# Patient Record
Sex: Male | Born: 1945 | Race: Black or African American | Hispanic: No | Marital: Married | Smoking: Never smoker
Health system: Southern US, Community
[De-identification: ages and names within clinical notes are randomized; demographics above are authoritative.]

## PROBLEM LIST (undated history)

## (undated) ENCOUNTER — Ambulatory Visit (HOSPITAL_COMMUNITY): Disposition: A | Discharge status: Home / Self Care | Payer: Medicare HMO

## (undated) DIAGNOSIS — I2510 Atherosclerotic heart disease of native coronary artery without angina pectoris: Secondary | ICD-10-CM

## (undated) DIAGNOSIS — I10 Essential (primary) hypertension: Secondary | ICD-10-CM

## (undated) DIAGNOSIS — M25569 Pain in unspecified knee: Secondary | ICD-10-CM

## (undated) DIAGNOSIS — H43391 Other vitreous opacities, right eye: Secondary | ICD-10-CM

## (undated) DIAGNOSIS — Z860100 Personal history of colon polyps, unspecified: Secondary | ICD-10-CM

## (undated) DIAGNOSIS — D352 Benign neoplasm of pituitary gland: Secondary | ICD-10-CM

## (undated) DIAGNOSIS — E7800 Pure hypercholesterolemia, unspecified: Secondary | ICD-10-CM

## (undated) DIAGNOSIS — K5900 Constipation, unspecified: Secondary | ICD-10-CM

## (undated) DIAGNOSIS — N400 Enlarged prostate without lower urinary tract symptoms: Secondary | ICD-10-CM

## (undated) DIAGNOSIS — Z9289 Personal history of other medical treatment: Secondary | ICD-10-CM

## (undated) DIAGNOSIS — E785 Hyperlipidemia, unspecified: Secondary | ICD-10-CM

## (undated) DIAGNOSIS — E119 Type 2 diabetes mellitus without complications: Secondary | ICD-10-CM

## (undated) DIAGNOSIS — Z800 Family history of malignant neoplasm of digestive organs: Secondary | ICD-10-CM

## (undated) DIAGNOSIS — R202 Paresthesia of skin: Secondary | ICD-10-CM

## (undated) DIAGNOSIS — J189 Pneumonia, unspecified organism: Secondary | ICD-10-CM

## (undated) DIAGNOSIS — R413 Other amnesia: Secondary | ICD-10-CM

## (undated) HISTORY — PX: JOINT REPLACEMENT: SHX530

## (undated) HISTORY — DX: Paresthesia of skin: R20.2

## (undated) HISTORY — PX: CARDIAC CATHETERIZATION: SHX172

## (undated) HISTORY — DX: Other amnesia: R41.3

## (undated) HISTORY — PX: ELBOW SURGERY: SHX618

## (undated) HISTORY — DX: Pure hypercholesterolemia, unspecified: E78.00

## (undated) HISTORY — DX: Benign prostatic hyperplasia without lower urinary tract symptoms: N40.0

## (undated) HISTORY — DX: Personal history of colon polyps, unspecified: Z86.0100

## (undated) HISTORY — DX: Benign neoplasm of pituitary gland: D35.2

## (undated) HISTORY — DX: Personal history of colonic polyps: Z86.010

## (undated) HISTORY — DX: Other vitreous opacities, right eye: H43.391

## (undated) HISTORY — PX: PICC LINE INSERTION: CATH118290

## (undated) HISTORY — DX: Constipation, unspecified: K59.00

## (undated) HISTORY — PX: TRIGGER FINGER RELEASE: SHX641

## (undated) HISTORY — DX: Pain in unspecified knee: M25.569

## (undated) HISTORY — DX: Family history of malignant neoplasm of digestive organs: Z80.0

---

## 1967-04-12 DIAGNOSIS — Z9289 Personal history of other medical treatment: Secondary | ICD-10-CM

## 1967-04-12 HISTORY — PX: RESECTION BONE TUMOR FEMUR: SHX2326

## 1967-04-12 HISTORY — DX: Personal history of other medical treatment: Z92.89

## 2002-05-13 ENCOUNTER — Encounter: Payer: Self-pay | Admitting: Emergency Medicine

## 2002-05-13 ENCOUNTER — Emergency Department (HOSPITAL_COMMUNITY): Admission: EM | Admit: 2002-05-13 | Discharge: 2002-05-13 | Payer: Self-pay | Admitting: Emergency Medicine

## 2003-04-12 HISTORY — PX: TOTAL KNEE ARTHROPLASTY: SHX125

## 2003-10-15 ENCOUNTER — Emergency Department (HOSPITAL_COMMUNITY): Admission: EM | Admit: 2003-10-15 | Discharge: 2003-10-15 | Payer: Self-pay | Admitting: Emergency Medicine

## 2005-01-12 ENCOUNTER — Inpatient Hospital Stay (HOSPITAL_COMMUNITY): Admission: EM | Admit: 2005-01-12 | Discharge: 2005-01-13 | Payer: Self-pay | Admitting: Emergency Medicine

## 2005-01-12 ENCOUNTER — Ambulatory Visit: Payer: Self-pay | Admitting: Internal Medicine

## 2005-02-03 ENCOUNTER — Ambulatory Visit: Payer: Self-pay | Admitting: Internal Medicine

## 2005-02-08 ENCOUNTER — Ambulatory Visit: Payer: Self-pay

## 2005-11-16 ENCOUNTER — Emergency Department (HOSPITAL_COMMUNITY): Admission: EM | Admit: 2005-11-16 | Discharge: 2005-11-16 | Payer: Self-pay | Admitting: Emergency Medicine

## 2006-02-17 ENCOUNTER — Encounter: Admission: RE | Admit: 2006-02-17 | Discharge: 2006-02-17 | Payer: Self-pay | Admitting: Endocrinology

## 2007-01-31 ENCOUNTER — Emergency Department (HOSPITAL_COMMUNITY): Admission: EM | Admit: 2007-01-31 | Discharge: 2007-01-31 | Payer: Self-pay | Admitting: Emergency Medicine

## 2007-04-27 ENCOUNTER — Observation Stay (HOSPITAL_COMMUNITY): Admission: EM | Admit: 2007-04-27 | Discharge: 2007-04-28 | Payer: Self-pay | Admitting: Emergency Medicine

## 2007-04-27 ENCOUNTER — Ambulatory Visit: Payer: Self-pay | Admitting: Cardiology

## 2007-05-15 ENCOUNTER — Ambulatory Visit: Payer: Self-pay

## 2007-05-16 ENCOUNTER — Ambulatory Visit: Payer: Self-pay | Admitting: Internal Medicine

## 2007-10-13 ENCOUNTER — Emergency Department (HOSPITAL_COMMUNITY): Admission: EM | Admit: 2007-10-13 | Discharge: 2007-10-13 | Payer: Self-pay | Admitting: Emergency Medicine

## 2008-01-23 ENCOUNTER — Emergency Department (HOSPITAL_BASED_OUTPATIENT_CLINIC_OR_DEPARTMENT_OTHER): Admission: EM | Admit: 2008-01-23 | Discharge: 2008-01-23 | Payer: Self-pay | Admitting: Emergency Medicine

## 2008-05-29 DIAGNOSIS — E782 Mixed hyperlipidemia: Secondary | ICD-10-CM | POA: Insufficient documentation

## 2008-05-29 DIAGNOSIS — I2510 Atherosclerotic heart disease of native coronary artery without angina pectoris: Secondary | ICD-10-CM | POA: Insufficient documentation

## 2008-05-29 DIAGNOSIS — I10 Essential (primary) hypertension: Secondary | ICD-10-CM

## 2009-01-17 ENCOUNTER — Emergency Department (HOSPITAL_COMMUNITY): Admission: EM | Admit: 2009-01-17 | Discharge: 2009-01-17 | Payer: Self-pay | Admitting: Family Medicine

## 2009-04-17 ENCOUNTER — Encounter: Admission: RE | Admit: 2009-04-17 | Discharge: 2009-04-17 | Payer: Self-pay | Admitting: Gastroenterology

## 2009-04-30 ENCOUNTER — Encounter: Admission: RE | Admit: 2009-04-30 | Discharge: 2009-04-30 | Payer: Self-pay | Admitting: Allergy

## 2010-08-24 NOTE — Discharge Summary (Signed)
NAME:  Joshua Vargas, Joshua Vargas NO.:  [id number]   MEDICAL RECORD NO.:  [id number]          PATIENT TYPE:  OBV   LOCATION:  3704                         FACILITY:  [facility]   PHYSICIAN:  Bevelyn Buckles. Bensimhon, MDDATE OF BIRTH:  21-May-1945   DATE OF ADMISSION:  04/27/2007  DATE OF DISCHARGE:  04/28/2007                               DISCHARGE SUMMARY   PRIMARY CARDIOLOGIST:  Bevelyn Buckles. Bensimhon, MD.   PRIMARY CARE:  Reather Littler, MD.   DISCHARGING DIAGNOSES:  1. Chest pain, atypical negative, cardiac enzymes this admission.      Electrocardiogram without acute ST or T-wave changes.  2. Shoulder pain with recommendations for Naprosyn p.r.n. at      discharge.  3. Coronary artery disease, status post catheterization in 2006 with      recommendations for medical therapy at that time.  4. Diabetes.  5. Hypertension.  6. Hyperlipidemia.  7. Obesity  8. 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   HOSPITAL COURSE:  [name] is very pleasant 65 year old African  American gentleman with history stated above, who presented with  complaints of episodic left shoulder and arm pain as well as chest pain,  felt to be atypical but with known history of coronary artery disease.  The patient was admitted for observation and cycling enzymes.  EKG  without acute changes.  Dr. Gala Romney in to see the patient on day of  discharge.  The patient up ambulating without chest discomfort.  Being  discharged home for outpatient stress Myoview.  Office will call the  patient at home to arrange.  Appropriate information has been faxed in  our nuclear medicine department so to call the patient and schedule.   Medications as prior to this admission include:  1. Aspirin 81 mg.  2. Actos/metformin 15/850 mg.  3. Crestor 5 mg.  4. Benicar/hydrochlorothiazide 40/12.5 mg.  5. Klor-Con 10 mEq.  6.  Dostinex 0.5 mg weekly.  7. Naprosyn for shoulder pain p.r.n.  8. Nitroglycerin as needed.   Follow up with Dr. Gala Romney after stress Myoview.   DURATION OF DISCHARGE ENCOUNTER:  Less than 30 minutes.      Dorian Pod, ACNP      Bevelyn Buckles. Bensimhon, MD  Electronically Signed    MB/MEDQ  D:  04/28/2007  T:  04/28/2007  Job:  [id number]   cc:   Reather Littler, M.D.

## 2010-08-24 NOTE — H&P (Signed)
NAME:  Joshua Vargas, Joshua Vargas NO.:  [id number]   MEDICAL RECORD NO.:  [id number]          PATIENT TYPE:  OBV   LOCATION:  3704                         FACILITY:  [facility]   PHYSICIAN:  Madolyn Frieze. Jens Som, MD, FACCDATE OF BIRTH:  1946/04/05   DATE OF ADMISSION:  04/27/2007  DATE OF DISCHARGE:                              HISTORY & PHYSICAL   PRIMARY CARE PHYSICIAN:  Reather Littler, MD   PRIMARY CARDIOLOGIST:  Bevelyn Buckles. Bensimhon, MD   CHIEF COMPLAINT:  Chest pain.   HISTORY OF PRESENT ILLNESS:  Joshua Vargas is a 65 year old male with a  history of coronary artery disease treated medically.  He has a long  history of episodic left shoulder and arm pain as well as chest pain.  He describes it as a dull pain.  There is no initiating factor.  There  are no obvious aggravating or alleviating factors.  There is no  association with meals, deep inspiration, cough.  It occurs at rest and  with exertion, although he exerts himself very little.  The symptoms are  not consistent with exertion.  The symptoms may last from 1 hour to all  day.  The only medication he has tried has been aspirin and some  Tylenol, but it is not clear if those helped.  Sometimes he has nausea  associated with it but no shortness of breath, vomiting or diaphoresis.  He has had the symptoms ever since his heart catheterization in 2006.  Today the symptoms were more severe at a 7/10.  He came to the emergency  room, where he received sublingual nitroglycerin and now his chest pain  is a 2/10.  He states that this time the chest pain has been going on  all day and that he had it all day yesterday as well.  Although he is  still complaining of some mild pain, he is otherwise resting  comfortably.   PAST MEDICAL HISTORY:  1. Status post cardiac catheterization in 2006 with LAD no significant      disease, diagonal 70-75% stenosed, circumflex and OM no significant      CAD, and RCA no significant CAD, EF at least  70%, medical therapy      recommended.  2. Diabetes.  3. Hypertension.  4. Hyperlipidemia.  5. Family history of premature coronary artery disease.  6. 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  7. Obesity.   SURGICAL HISTORY:  Status post left total knee replacement and multiple  left lower extremity surgeries because of the tumor.  He is also status  post cardiac catheterization.   ALLERGIES:  No known drug allergies.   CURRENT MEDICATIONS:  1. Benicar/HCT 12.5//40 mg daily.  2. Aspirin 81 mg daily.  3. Crestor 5 mg daily.  4. Actos/metformin 15/850 mg daily.  5. Klor-Con 10 mEq daily.  6. Dostinex 0.5 mg weekly.   SOCIAL HISTORY:  He lives in [place] with his wife and works as a  [employer]ar[employer] manager.  He has no history of alcohol, tobacco or drug  abuse.  Because of the lower extremity problems,  he does not exert  himself very much or walk very much.   FAMILY HISTORY:  His mother is alive at age 23 with no history of  coronary artery disease.  His father died at age 73 of a stroke and his  brother died at age 64 of an MI.   REVIEW OF SYSTEMS:  He had a urinary tract infection recently that is  resolved after treatment.  He has left-sided weakness and states it is  noticeable only in that he cannot do all the things with his left arm  that he used to be able to do and his left leg is chronically weak also.  He has rare reflux symptoms but denies hematemesis, hemoptysis or  melena.  He has had no coughing or wheezing.  He denies shortness of  breath, dyspnea on exertion, orthopnea or PND.  He has occasional  daytime edema in his left lower extremity.  A full 14-point review of  systems is otherwise negative.   PHYSICAL EXAM:  VITAL SIGNS:  Temperature is 97.7, blood pressure  187/98, pulse 65, respiratory rate 18, O2 saturation 99% on room air.  GENERAL:  He is a well-developed, well-nourished African American male  in no acute distress.  HEENT:   Normal.  NECK:  There is no lymphadenopathy, thyromegaly, bruit or JVD noted.  CV:  His heart is regular in rate and rhythm with an S1-S2, with no  critical murmur, rub or gallop noted.  Distal pulses are intact in all  four extremities with no femoral bruits appreciated.  LUNGS:  Clear to auscultation bilaterally.  SKIN:  No rashes or lesions are noted.  ABDOMEN:  Soft and nontender with active bowel sounds.  EXTREMITIES:  There is no cyanosis, clubbing or edema noted.  MUSCULOSKELETAL:  There is no spine or CVA tenderness.  No joint  effusion effusions are noted.  NEUROLOGIC:  He is alert and oriented.  Cranial nerves II-XII grossly  intact.   ECG is sinus rhythm, rate 60, with no acute ischemic changes.  Of note,  on telemetry his heart rate is frequently in the 50s and occasionally  drops into the 40s but is sinus rhythm throughout.   Laboratory values are pending except for point care markers negative x1.   IMPRESSION:  Joshua Vargas is a 65 year old male with past medical history  of multiple cardiac risk factors as well as a 70-75% diagonal by  catheterization in 2006, who is here with chest pain.  He had no other  obstructive coronary artery disease at catheterization and the diagonal  was felt to be a 2-2.5-mm vessel and therefore medical management was  the most appropriate.  He has had intermittent chest pain since.  The  pain is under his left breast and radiates to his left upper extremity  with no associated symptoms except for today he had some nausea.  It  seems to increase somewhat by lying flat but is not pleuritic or  exertional, and it can last 1 hour to all day.  He has had continuous  chest pain now for approximately 48 hours and his EKG is without ST  changes and is sinus rhythm.  His chest pain is very atypical.  He will  be admitted and myocardial infarction will be ruled out.  Will check an  EKG in  a.m.  If his EKG and cardiac enzymes remain negative, he  will be  discharged and can follow up as an outpatient with a Myoview and  then  follow up with Dr. Gala Romney.  He will be continued on his home  medications including Glucophage, as no dye studies are planned this  admission.      Theodore Demark, PA-C      Madolyn Frieze. Jens Som, MD, [facility]  Electronically Signed    RB/MEDQ  D:  04/27/2007  T:  04/28/2007  Job:  [id number]   cc:   Reather Littler, M.D.

## 2010-08-24 NOTE — Assessment & Plan Note (Signed)
[facility]                            CARDIOLOGY OFFICE NOTE   NAME:Joshua Vargas                     MRN:          [id number]  DATE:05/16/2007                            DOB:          [birth year]    PRIMARY CARE PHYSICIAN:  Dr. Reather Littler.   HISTORY:  [name] is a delightful 65 year old male with history of  hypertension, diabetes, and hyperlipidemia.  He also has coronary artery  disease with a 70-75% stenosis in the first diagonal.  Based on  catheterization in October 2006.   He has recently admitted with atypical chest pain.  Ruled out for  myocardial infarction with serial cardiac markers.  The pain resolved.  He is able to ambulate without any problem.  He underwent an outpatient  Myoview showed an EF of 62% with no ischemia.   He returns today for routine follow-up.  Is doing great.  No chest pain  or shortness of breath.  Recently saw Dr. Lucianne Muss and was found that his  blood pressure was significantly elevated, and was discovered he was not  taking his Norvasc.  Restarted this a day or two ago.   CURRENT MEDICATIONS:  Aspirin 81.  Crestor 5 a day, potassium, Benicar  hydrochlorothiazide 40/12.5, cabergoline  5 mg a week, Actoplus 15/850 a  day, amlodipine 10 a day.   EXAM:  He is well-appearing in no acute distress imaged in the clinic  without respiratory difficulty.  Blood pressure is 138/88.  Heart rate is 77 weights 280.  HEENT is normal.  NECK:  Supple.  No JVD.  Carotid 2+ bilateral bruits.  There is no  lymphadenopathy or thyromegaly.  CARDIAC:  PMI is nondisplaced.  He is regular rate and rhythm with an S4  no murmur.  LUNGS:  Clear.  ABDOMEN:  Soft, nontender, nondistended.  There is no  hepatosplenomegaly, no bruits, no masses.  Good bowel sounds.  EXTREMITIES:  Warm with no cyanosis, clubbing or edema.  No rash.  NEURO:  Alert and oriented x3.  Cranial nerves II-XII are intact.  Moves  all fours difficulty.  Affect  is pleasant.   EKG shows sinus rhythm with a rate of 77 with LVH and there are U waves.  No significant ST-T wave abnormalities.   ASSESSMENT/PLAN:  1. Chest pain/coronary artery disease.  He is doing well.  No evidence      of ischemia.  Continue current therapy.  I strongly recommended      extremity routine exercise program.  2. Hypertension, blood pressures mildly elevated is being followed      closely by Dr. Lucianne Muss.  3. Hyperlipidemia, given his coronary disease and diabetes goal LDL      less than 70.  Once again followed by Dr. Lucianne Muss titrate statin as      needed.   DISPOSITION:  We will see him back for routine follow-up in 6 months for  further evaluation.     Bevelyn Buckles. Bensimhon, MD  Electronically Signed    DRB/MedQ  DD: 05/16/2007  DT: 05/17/2007  Job #: [id number]   cc:  Reather Littler, M.D.

## 2010-08-27 NOTE — Discharge Summary (Signed)
NAME:  Joshua Vargas, Joshua Vargas NO.:  [id number]   MEDICAL RECORD NO.:  [id number]          PATIENT TYPE:  INP   LOCATION:  6525                         FACILITY:  [facility]   PHYSICIAN:  Arvilla Meres, M.D. LHCDATE OF BIRTH:  [birth year]   DATE OF ADMISSION:  01/12/2005  DATE OF DISCHARGE:  01/13/2005                                 DISCHARGE SUMMARY   PRIMARY CARE PHYSICIAN:  Reather Littler, M.D.   PRIMARY CARDIOLOGIST:  Arvilla Meres, M.D. [facility]   PRINCIPAL DIAGNOSIS:  Unstable angina/coronary artery disease.   OTHER DIAGNOSES:  1.  Type 2 diabetes mellitus.  2.  Hyperlipidemia.  3.  Hypertension.  4.  Family history of coronary artery disease.  5.  Multiple surgeries of the left lower extremity including left total knee      replacement.   ALLERGIES:  No known drug allergies.   PROCEDURE:  Left heart catheterization.   HISTORY OF PRESENT ILLNESS:  A 65 year old African American male with no  prior history of CAD who presented to the [facility] ED with a six-month  history of episodic rest and exertional 5/10 left-sided chest pressure and  heaviness, without associated symptoms. On the day of admission, he had an  episode that was worse than previous and was associated with shortness of  breath, nausea, and diaphoresis. As a result of this he presented to the  [facility] ED for further evaluation.   HOSPITAL COURSE:  The patient had negative CKs and MBs, but had mild  elevation of troponin of 0.06.  He underwent left heart catheterization on  October 4th that showed normal left main, and normal LAD, circumflex, and  RCA. He did have a 70% to 75% stenosis and a 2.5 mm first diagonal vessel.  His films were reviewed with the interventional team and it was felt that  given the small size of the diagonal that risk for restenosis in that vessel  was quite high and it was recommended that he be initiated on medical  therapy. He was initially on beta blocker, aspirin,  Plavix, and was  maintained on his ARB and Statin. He was also initiated on Imdur therapy.  The patient has not had any recurring chest pain and will follow up with Dr.  Gala Romney in three weeks. He is being discharged home today in satisfactory  condition.   DISCHARGE LABORATORY:  Hemoglobin 12.8, hematocrit 38, WBC 5.3, platelet  count 265,000. Sodium 138, potassium 3.5, chloride 104, BUN 13, creatinine  1.9, glucose 75. Troponin 0.06. CK and MB negative.   DISPOSITION:  The patient is being discharged home today in good condition.   FOLLOWUP PLANS AND APPOINTMENTS:  She is asked to follow up with primary  care physician, Dr. Lucianne Muss, within the next one to two weeks. He is to follow  up with Dr. Gala Romney on February 03, 2005, at 11 a.m.   DISCHARGE MEDICATIONS:  1.  Aspirin 81 g daily.  2.  Plavix 75 mg daily.  3.  Toprol-XL 50 mg daily.  4.  Caduet 40/10 mg daily.  5.  Benicar/HCT 40/25 mg daily.  6.  Metformin 1000 mg b.i.d. to be resumed on January 14, 2005.  7.  Amaryl 4 mg half tablet in the a.m. and one full tablet in the p.m.  8.  Imdur 3 mg daily.  9.  Nitroglycerin 0.4 mg sublingual p.r.n. chest pain.   OUTSTANDING LAB STUDIES:  None.   Discharge encounter 45 minutes, including physician time.      Ok Anis, NP      Arvilla Meres, M.D. [facility]  Electronically Signed    CRB/MEDQ  D:  01/13/2005  T:  01/13/2005  Job:  [id number]   cc:   Reather Littler, M.D.  Fax: [phone]   Arvilla Meres, M.D. [facility]  [employer]  [address]  [place]  [place] [address]

## 2010-08-27 NOTE — H&P (Signed)
NAME:  Joshua Vargas, Joshua Vargas NO.:  [id number]   MEDICAL RECORD NO.:  [id number]          PATIENT TYPE:  EMS   LOCATION:  MINO                         FACILITY:  [facility]   PHYSICIAN:  Arvilla Meres, M.D. LHCDATE OF BIRTH:  28-Sep-1945   DATE OF ADMISSION:  01/12/2005  DATE OF DISCHARGE:                                HISTORY & PHYSICAL   PRIMARY CARE PHYSICIAN:  Reather Littler, M.D.   PRIMARY CARDIOLOGIST:  New and will be Arvilla Meres, M.D.   CHIEF COMPLAINT:  Chest pain.   HISTORY OF PRESENT ILLNESS:  Mr. Allbritton is a 65 year old African-American  male with no known history of coronary artery disease.  He has approximately  a 34-month history of episodic left chest pain. At first there was no  association with exertion or meals, but then he began having chest pain  while he was on the treadmill for about the last 3 months, although he has  not worked out in 3 weeks.  The patient reaches a 5/10 at its worst and is  described as a pressure and heaviness.  It is on the left side of his chest.  He has not really tried any medication for this except for one time he took  Advil and one time he took Tylenol.  There is no change with position, deep  inspiration, or cough.  He tried eating at one point, but this did not seem  to affect his symptoms either.  The symptoms are associated at times with  shortness of breath, nausea, and diaphoresis.  Today he was at work and had  an episode, and the shortness of breath was worse than usual.  He came to  the emergency room and is currently symptom free.   PAST MEDICAL HISTORY:  1.  History of diabetes for 5 years.  2.  History of hyperlipidemia.  3.  History of hypertension.  4.  Family history of coronary artery disease.  5.  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   He has no other surgical  history.   ALLERGIES:  No known drug allergies.   CURRENT MEDICATIONS:  1.  Lipitor 10 mg daily.  2.  Amaryl 4 mg 1/2 tablet a.m., 1 tablet p.m.  3.  Benicar/hydrochlorothiazide 40/25, although this has not been filled      recently.  4.  Metformin 1000 mg twice daily.  5.  Aspirin 81 mg daily.  6.  Caduet 10/10 and Norvasc 10 are described as being on hold.   SOCIAL HISTORY:  He lives in [place] with his mother and works as a  Quarry manager.  He has no history of alcohol, tobacco, or drug abuse.  He exercises at the [facility], although not recently.   FAMILY HISTORY:  His mother is alive at age 64 with no history of heart  disease.  His father died of a stroke at age 65, and he had  a brother who  died of an MI at age 27.   REVIEW OF SYSTEMS:  He has vision loss and wears glasses.  He has episodic  numbness in his left arm that is not always associated with sitting in one  position for long periods of time but frequently is associated with this.  It is in his left elbow and radiates down into the fourth and fifth fingers  only.  He has arthralgias in his legs.  He denies any hematemesis,  hemoptysis, melena, heartburn, or indigestion.  Review of Systems is  otherwise negative.   PHYSICAL EXAMINATION:  VITAL SIGNS:  Temperature 97.9, pulse 78, respiratory  rate 20, blood pressure 141/68, O2 saturation 99% on room air.  GENERAL:  He is a well-developed, well-nourished African-American male in no  acute distress.  HEENT:  Head is normocephalic and atraumatic with pupils equal, round, and  reactive to light and accommodation. Extraocular movements intact.  Sclerae  clear.  Nares without discharge.  NECK:  There is no lymphadenopathy, thyromegaly, bruit, or JVD noted.  CARDIOVASCULAR: His heart is regular in rate and rhythm with S1 and S2 and  no significant murmur, rub, or gallop noted. His PMI is not displaced.  Distal pulses are 2+ in all 4 extremities, and no femoral bruits  are  appreciated.  LUNGS:  Clear to auscultation bilaterally.  SKIN:  No rashes or lesions are noted.  ABDOMEN:  Soft and nontender with active bowel sounds and no  hepatosplenomegaly by palpation.  EXTREMITIES: There is no cyanosis, clubbing, or edema noted.  MUSCULOSKELETAL: He has no joint effusions and no spine or CVA tenderness.  NEUROLOGIC: Alert and oriented.  Cranial nerves II-XII grossly intact.  Strength 5/5 all extremities.   Chest x-ray: No acute disease.   EKG: Rate 76, sinus rhythm, with no acute ischemic changes, and no old is  available for comparison.   Laboratory values: LFTs were within normal limits.  Hemoglobin 12.8,  hematocrit 38, WBC 5.3, platelets 265.  Sodium 138, potassium 3.5, chloride  104, CO2 not tested, BUN 13, creatinine 1, glucose 75.  Point-of-care  markers negative x1.   IMPRESSION AND PLAN:  Chest pain: It is progressive with both exertional and  resting symptoms.  It has typical and atypical features.  Point-of-care  markers are negative x1, and EKG is normal.  Given multiple cardiac risk  factors, will admit, rule out myocardial infarction, and plan for  catheterization in a.m.  Risks and benefits are  discussed with the patient, and he agrees to proceed. We will add a beta  blocker to his medication regimen and continue him on aspirin and statins.  Will add heparin as well.  Metformin will be held, and sliding scale will be  used.      Theodore Demark, P.A. [facility]      Arvilla Meres, M.D. [facility]  Electronically Signed    RB/MEDQ  D:  01/12/2005  T:  01/12/2005  Job:  [id number]   cc:   Reather Littler, M.D.  Fax: [phone]

## 2010-08-27 NOTE — Cardiovascular Report (Signed)
NAME:  Joshua Vargas, Joshua Vargas NO.:  [id number]   MEDICAL RECORD NO.:  [id number]          PATIENT TYPE:  INP   LOCATION:  6525                         FACILITY:  [facility]   PHYSICIAN:  Arvilla Meres, M.D. LHCDATE OF BIRTH:  [birth year]   DATE OF PROCEDURE:  01/12/2005  DATE OF DISCHARGE:                              CARDIAC CATHETERIZATION   PRIMARY CARE PHYSICIAN:  Dr. Lucianne Muss, cardiology. He is new to [facility].   PATIENT IDENTIFICATION:  Joshua Vargas is a very pleasant 65 year old male  with no known history of coronary disease but a history of hypertension,  diabetes and hyperlipidemia. He has had a several-month history of  exertional chest pain. This has both typical and atypical features. He  states that when he began to walk on the treadmill he had some chest  discomfort which is then relieved as he progresses but the chest pain then  comes back at peak workloads. Over the past few days, the chest pain has  been somewhat worse and he came to the emergency room for evaluation.  Initial EKG was normal. First set of cardiac markers were normal. Given his  risk factors and quality of chest pain, he is brought to the catheterization  lab to clearly define his coronary anatomy.   PROCEDURES PERFORMED:  1.  Selective coronary angiography.  2.  Left heart cath.  3.  Left ventriculogram.   DESCRIPTION OF PROCEDURE:  The risks, benefits of procedure explained to Mr.  Careers adviser. Consent was signed and placed on the chart. A 6-French arterial  sheath was placed in the right femoral artery using modified Seldinger  technique. Standard catheters including JL-4, JR-4 and angled pigtail were  used for the procedure. All catheter exchanges made over wire. There were no  apparent complications.   FINDINGS:  Central aortic pressure was 155/89 with a mean of 119. LV  pressure is 141/1 with an LVEDP of 10. There is no gradient on aortic valve  on pullback.   CORONARY  ANATOMY:  Left main was angiographically normal.   LAD was a long vessel that wrapped the apex. It gave off a single large  proximal diagonal. In the LAD itself there was no significant coronary  disease. In the proximal portion of the large diagonal, there was a 70-75%  stenosis. Initially this appeared to be about 2 mm vessel; however, after  injection of intracoronary nitroglycerin, it appeared to be 2.5 mm and the  lesion did appear to be a slightly more significant.   The left circumflex was a large vessel. It gave off a very tiny ramus. The  distal AV groove circumflex was small. The majority of the vessel was made  up of the large branching OM1. There was no significant angiographic CAD.   Right coronary artery was a dominant vessel. It gave off a large RV branch  and thus moderate size PDA and two small PLs. There is no significant  coronary artery disease.   Left ventriculogram done in the RAO position showed hyperdynamic LV with EF  of least 70%. There was no significant wall motion abnormalities  or mitral  regurgitation.   ASSESSMENT:  1.  Single-vessel coronary artery disease with borderline lesion in the      proximal first diagonal.  2.  Normal left ventricular function.  3.  Poorly controlled hypertension.   DISCUSSION:  Diagonal lesion does appear to be significant; however, it is 2-  2.5 mm vessel. Given his diabetes he would be at high risk for restenosis of  stent. Thus I do think it may be reasonable to proceed with a trial of  medical therapy. However, given his symptoms, I will review the  catheterization films with the interventional team in the morning to further  to decide. For now we will admit him and rule him out for myocardial  infarction and also proceed with risk factor management.      Arvilla Meres, M.D. [facility]  Electronically Signed     DB/MEDQ  D:  01/12/2005  T:  01/12/2005  Job:  [id number]   cc:   Lucianne Muss, M.D.  [facility]

## 2010-10-15 ENCOUNTER — Other Ambulatory Visit: Payer: Self-pay | Admitting: Gastroenterology

## 2010-10-15 DIAGNOSIS — R945 Abnormal results of liver function studies: Secondary | ICD-10-CM

## 2010-10-18 ENCOUNTER — Other Ambulatory Visit: Payer: Self-pay

## 2010-11-03 ENCOUNTER — Encounter: Payer: Self-pay | Admitting: *Deleted

## 2010-11-03 ENCOUNTER — Emergency Department (HOSPITAL_BASED_OUTPATIENT_CLINIC_OR_DEPARTMENT_OTHER)
Admission: EM | Admit: 2010-11-03 | Discharge: 2010-11-03 | Disposition: A | Discharge status: Home / Self Care | Payer: 59 | Attending: Emergency Medicine | Admitting: Emergency Medicine

## 2010-11-03 DIAGNOSIS — E119 Type 2 diabetes mellitus without complications: Secondary | ICD-10-CM | POA: Insufficient documentation

## 2010-11-03 DIAGNOSIS — I10 Essential (primary) hypertension: Secondary | ICD-10-CM | POA: Insufficient documentation

## 2010-11-03 DIAGNOSIS — E785 Hyperlipidemia, unspecified: Secondary | ICD-10-CM | POA: Insufficient documentation

## 2010-11-03 DIAGNOSIS — T7840XA Allergy, unspecified, initial encounter: Secondary | ICD-10-CM

## 2010-11-03 DIAGNOSIS — R221 Localized swelling, mass and lump, neck: Secondary | ICD-10-CM | POA: Insufficient documentation

## 2010-11-03 DIAGNOSIS — R220 Localized swelling, mass and lump, head: Secondary | ICD-10-CM | POA: Insufficient documentation

## 2010-11-03 HISTORY — DX: Essential (primary) hypertension: I10

## 2010-11-03 HISTORY — DX: Hyperlipidemia, unspecified: E78.5

## 2010-11-03 MED ORDER — PENICILLIN V POTASSIUM 500 MG PO TABS: 500.0000 mg | ORAL_TABLET | Freq: Three times a day (TID) | ORAL | Status: AC

## 2010-11-03 MED ORDER — METHYLPREDNISOLONE SODIUM SUCC 125 MG IJ SOLR
INTRAMUSCULAR | Status: AC
  Filled 2010-11-03: qty 2

## 2010-11-03 MED ORDER — PREDNISONE 10 MG PO TABS: 20.0000 mg | ORAL_TABLET | Freq: Two times a day (BID) | ORAL | Status: AC

## 2010-11-03 MED ORDER — METHYLPREDNISOLONE SODIUM SUCC 125 MG IJ SOLR
125.0000 mg | Freq: Once | INTRAMUSCULAR | Status: AC
  Administered 2010-11-03: 125 mg via INTRAMUSCULAR

## 2010-11-03 NOTE — ED Notes (Signed)
Pt reports receiving dental procedure yesterday and now has lower lip edema.  Denies any SHOB, difficulty breathing or swallowing.  He has had similar symptoms in the past relieved by IM steroids.

## 2010-11-03 NOTE — ED Notes (Signed)
Pt states dental work yesterday, lip swelling started last night around 7 pm

## 2010-11-03 NOTE — ED Provider Notes (Signed)
History     Chief Complaint  Patient presents with  . Allergic Reaction   HPI Comments: Had dental work done yesterday, now with swelling of lower lip.  Patient states that he always has this reaction to the anesthetic.  In the past, he has been given a "steriod shot" that seems to improve the swelling.  Denies fever or pain.  Patient is a 65 y.o. male presenting with allergic reaction. The history is provided by the patient.  Allergic Reaction The primary symptoms do not include wheezing, shortness of breath or palpitations. The current episode started 6 to 12 hours ago. The problem has been gradually worsening.  The onset of the reaction was associated with a new medication. Significant symptoms that are not present include rhinorrhea.    Past Medical History  Diagnosis Date  . Hypertension   . Diabetes mellitus   . Hyperlipemia     Past Surgical History  Procedure Date  . Joint replacement     History reviewed. No pertinent family history.  History  Substance Use Topics  . Smoking status: Never Smoker   . Smokeless tobacco: Not on file  . Alcohol Use: No      Review of Systems  Constitutional: Negative for fever, chills and diaphoresis.  HENT: Positive for facial swelling. Negative for rhinorrhea and postnasal drip.   Respiratory: Negative for shortness of breath, wheezing and stridor.   Cardiovascular: Negative for chest pain and palpitations.  All other systems reviewed and are negative.    Physical Exam  BP 122/80  Pulse 70  Temp 98.4 F (36.9 C)  Resp 16  Wt 214 lb (97.07 kg)  Physical Exam  Constitutional: He appears well-developed and well-nourished. No distress.  HENT:  Head: Normocephalic and atraumatic.       The lower lip is noted to have significant swelling.  There is no airway compromise or stridor.    Neck: Normal range of motion. Neck supple. No tracheal deviation present.  Cardiovascular: Normal rate, regular rhythm and normal heart  sounds.  Exam reveals no friction rub.   No murmur heard. Pulmonary/Chest: Effort normal and breath sounds normal. No stridor. No respiratory distress. He has no wheezes.  Skin: He is not diaphoretic.    ED Course  Procedures  MDM No worsening during ED course.  Will discharge with prednisone prescription.  I will also prescribe PCN and advise patient that if he worsens or starts with fever, he should fill this and initiate treatment.        Riley Lam [facility] 11/03/10 1648

## 2010-12-29 ENCOUNTER — Ambulatory Visit
Admission: RE | Admit: 2010-12-29 | Discharge: 2010-12-29 | Disposition: A | Discharge status: Home / Self Care | Payer: 59 | Source: Ambulatory Visit | Attending: Gastroenterology | Admitting: Gastroenterology

## 2010-12-29 DIAGNOSIS — R945 Abnormal results of liver function studies: Secondary | ICD-10-CM

## 2010-12-30 LAB — DIFFERENTIAL
Basophils Absolute: 0.1
Basophils Relative: 1
Eosinophils Absolute: 0.3
Eosinophils Relative: 6 — ABNORMAL HIGH
Lymphocytes Relative: 35
Lymphs Abs: 1.7
Monocytes Absolute: 0.4
Monocytes Relative: 9
Neutro Abs: 2.5
Neutrophils Relative %: 49

## 2010-12-30 LAB — CBC
HCT: 42.7
Hemoglobin: 14.1
MCHC: 33
MCV: 85.7
Platelets: 225
RBC: 4.98
RDW: 14.1
WBC: 5

## 2010-12-30 LAB — POCT CARDIAC MARKERS
CKMB, poc: 2
Myoglobin, poc: 86.8
Operator id: 285841
Troponin i, poc: <0.05

## 2010-12-30 LAB — I-STAT 8, (EC8 V) (CONVERTED LAB)
Acid-Base Excess: 3 — ABNORMAL HIGH
BUN: 12
Bicarbonate: 29.6 — ABNORMAL HIGH
Chloride: 104
Glucose, Bld: 87
HCT: 47
Hemoglobin: 16
Operator id: 285841
Potassium: 3.7
Sodium: 140
TCO2: 31
pCO2, Ven: 52 — ABNORMAL HIGH
pH, Ven: 7.364 — ABNORMAL HIGH

## 2010-12-30 LAB — CARDIAC PANEL(CRET KIN+CKTOT+MB+TROPI)
CK, MB: 2.8
Relative Index: 1.4
Total CK: 199
Troponin I: <0.01

## 2010-12-30 LAB — TROPONIN I: Troponin I: 0.01

## 2010-12-30 LAB — LIPID PANEL: Cholesterol: 127

## 2010-12-30 LAB — POCT I-STAT CREATININE
Creatinine, Ser: 1
Operator id: 285841

## 2011-01-19 LAB — URINE MICROSCOPIC-ADD ON

## 2011-01-19 LAB — URINALYSIS, ROUTINE W REFLEX MICROSCOPIC
Glucose, UA: NEGATIVE
Hgb urine dipstick: NEGATIVE
Protein, ur: NEGATIVE
Specific Gravity, Urine: 1.018
pH: 6.5

## 2011-01-19 LAB — URINE CULTURE

## 2012-10-25 ENCOUNTER — Other Ambulatory Visit: Payer: Self-pay | Admitting: *Deleted

## 2012-10-25 MED ORDER — PIOGLITAZONE HCL-METFORMIN HCL 15-850 MG PO TABS: 1.0000 | ORAL_TABLET | Freq: Two times a day (BID) | ORAL | Status: DC

## 2012-11-09 ENCOUNTER — Other Ambulatory Visit: Payer: Self-pay | Admitting: *Deleted

## 2012-11-12 ENCOUNTER — Other Ambulatory Visit: Payer: Self-pay | Admitting: *Deleted

## 2012-11-12 MED ORDER — DOXAZOSIN MESYLATE 2 MG PO TABS: 2.0000 mg | ORAL_TABLET | Freq: Every day | ORAL | Status: DC

## 2012-12-05 LAB — HM DIABETES EYE EXAM: HM Diabetic Eye Exam: NORMAL

## 2012-12-17 ENCOUNTER — Other Ambulatory Visit: Payer: Self-pay | Admitting: Endocrinology

## 2012-12-17 ENCOUNTER — Other Ambulatory Visit: Payer: Self-pay | Admitting: *Deleted

## 2012-12-17 DIAGNOSIS — I10 Essential (primary) hypertension: Secondary | ICD-10-CM

## 2012-12-17 DIAGNOSIS — E782 Mixed hyperlipidemia: Secondary | ICD-10-CM

## 2012-12-17 DIAGNOSIS — D352 Benign neoplasm of pituitary gland: Secondary | ICD-10-CM

## 2012-12-17 DIAGNOSIS — E119 Type 2 diabetes mellitus without complications: Secondary | ICD-10-CM | POA: Insufficient documentation

## 2012-12-19 ENCOUNTER — Other Ambulatory Visit: Payer: 59

## 2012-12-20 ENCOUNTER — Other Ambulatory Visit: Payer: 59

## 2012-12-20 DIAGNOSIS — I10 Essential (primary) hypertension: Secondary | ICD-10-CM

## 2012-12-20 DIAGNOSIS — IMO0001 Reserved for inherently not codable concepts without codable children: Secondary | ICD-10-CM

## 2012-12-20 DIAGNOSIS — E782 Mixed hyperlipidemia: Secondary | ICD-10-CM

## 2012-12-20 DIAGNOSIS — D352 Benign neoplasm of pituitary gland: Secondary | ICD-10-CM

## 2012-12-20 LAB — MICROALBUMIN / CREATININE URINE RATIO
Creatinine,U: 153.8 mg/dL
Microalb Creat Ratio: 1.2 mg/g (ref 0.0–30.0)
Microalb, Ur: 1.9 mg/dL (ref 0.0–1.9)

## 2012-12-20 LAB — LIPID PANEL
Cholesterol: 149 mg/dL (ref 0–200)
LDL Cholesterol: 59 mg/dL (ref 0–99)
Total CHOL/HDL Ratio: 2

## 2012-12-20 LAB — CBC
Hemoglobin: 14.9 g/dL (ref 13.0–17.0)
MCV: 84.5 fl (ref 78.0–100.0)
Platelets: 191 10*3/uL (ref 150.0–400.0)
RBC: 5.31 Mil/uL (ref 4.22–5.81)

## 2012-12-20 LAB — COMPREHENSIVE METABOLIC PANEL
Albumin: 4.2 g/dL (ref 3.5–5.2)
CO2: 24 mEq/L (ref 19–32)
GFR: 81.67 mL/min (ref >60.00)
Glucose, Bld: 138 mg/dL — ABNORMAL HIGH (ref 70–99)
Potassium: 3.7 mEq/L (ref 3.5–5.1)
Sodium: 139 mEq/L (ref 135–145)
Total Protein: 7.7 g/dL (ref 6.0–8.3)

## 2012-12-21 ENCOUNTER — Ambulatory Visit: Payer: 59 | Admitting: Endocrinology

## 2012-12-24 ENCOUNTER — Ambulatory Visit (INDEPENDENT_AMBULATORY_CARE_PROVIDER_SITE_OTHER): Payer: 59 | Admitting: Endocrinology

## 2012-12-24 ENCOUNTER — Encounter: Payer: Self-pay | Admitting: Endocrinology

## 2012-12-24 VITALS — BP 144/72 | HR 71 | Temp 98.2°F | Resp 12 | Ht 70.0 in | Wt 219.1 lb

## 2012-12-24 DIAGNOSIS — I10 Essential (primary) hypertension: Secondary | ICD-10-CM

## 2012-12-24 DIAGNOSIS — N529 Male erectile dysfunction, unspecified: Secondary | ICD-10-CM

## 2012-12-24 DIAGNOSIS — IMO0001 Reserved for inherently not codable concepts without codable children: Secondary | ICD-10-CM

## 2012-12-24 DIAGNOSIS — E782 Mixed hyperlipidemia: Secondary | ICD-10-CM

## 2012-12-24 DIAGNOSIS — D352 Benign neoplasm of pituitary gland: Secondary | ICD-10-CM

## 2012-12-24 NOTE — Progress Notes (Signed)
Patient ID: Joshua Vargas, male   DOB: 1945/12/15, 67 y.o.   MRN: [id number]  Joshua Vargas is an 67 y.o. male.   Reason for Appointment: Diabetes follow-up   History of Present Illness   Diagnosis: Type 2 DIABETES MELITUS, date of diagnosis: 2002      Previous history: He was initially treated with metformin alone and then subsequently was also given Actos and Amaryl. He has had fairly good control but somewhat inconsistent because of difficulties with compliance with diet and exercise periodically. A1c previously has fluctuated between about 6.7-7.6  Recent history: His blood sugars appear to be still fairly well controlled with A1c near 7% He thinks he is usually watching his diet but has not been doing any exercise despite previous reminders     Oral hypoglycemic drugs: Actoplusmet and Amaryl        Side effects from medications: None       Monitors blood glucose: Once a day.    Glucometer: One Touch.          Blood Glucose readings from recall: readings before breakfast: 118-140 in the morning;  does not check after meals Hypoglycemia frequency:  none        Meals: 3 meals per day.          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 He has had better libido with consistently treating this with Dostinex, testosterone level previously has been normal  LABS:  Appointment on 12/20/2012  Component Date Value Range Status  . Microalb, Ur 12/20/2012 1.9  0.0 - 1.9 mg/dL Final  . Creatinine,U 78/29/5621 153.8   Final  . Microalb Creat Ratio 12/20/2012 1.2  0.0 - 30.0 mg/g Final  . Prolactin 12/20/2012 15.3  2.1 - 17.1 ng/mL Final   Comment:      Reference Ranges:                                            Male:                       2.1 -  17.1 ng/ml                                           Male:   Pregnant          9.7 - 208.5 ng/mL                                                     Non Pregnant      2.8 -  29.2 ng/mL  Post Menopausal   1.8 -  20.3 ng/mL                                              . Cholesterol 12/20/2012 149  0 - 200 mg/dL Final   ATP III Classification       Desirable:  < 200 mg/dL               Borderline High:  200 - 239 mg/dL          High:  > = 161 mg/dL  . Triglycerides 12/20/2012 126.0  0.0 - 149.0 mg/dL Final   Normal:  <096 mg/dLBorderline High:  150 - 199 mg/dL  . HDL 12/20/2012 65.10  >39.00 mg/dL Final  . VLDL 04/54/0981 25.2  0.0 - 40.0 mg/dL Final  . LDL Cholesterol 12/20/2012 59  0 - 99 mg/dL Final  . Total CHOL/HDL Ratio 12/20/2012 2   Final                  Men          Women1/2 Average Risk     3.4          3.3Average Risk          5.0          4.42X Average Risk          9.6          7.13X Average Risk          15.0          11.0                      . WBC 12/20/2012 4.7  4.5 - 10.5 K/uL Final  . RBC 12/20/2012 5.31  4.22 - 5.81 Mil/uL Final  . Platelets 12/20/2012 191.0  150.0 - 400.0 K/uL Final  . Hemoglobin 12/20/2012 14.9  13.0 - 17.0 g/dL Final  . HCT 19/14/7829 44.9  39.0 - 52.0 % Final  . MCV 12/20/2012 84.5  78.0 - 100.0 fl Final  . MCHC 12/20/2012 33.3  30.0 - 36.0 g/dL Final  . RDW 56/21/3086 14.3  11.5 - 14.6 % Final  . Hemoglobin A1C 12/20/2012 7.1* 4.6 - 6.5 % Final   Glycemic Control Guidelines for People with Diabetes:Non Diabetic:  <6%Goal of Therapy: <7%Additional Action Suggested:  >8%   . Sodium 12/20/2012 139  135 - 145 mEq/L Final  . Potassium 12/20/2012 3.7  3.5 - 5.1 mEq/L Final  . Chloride 12/20/2012 101  96 - 112 mEq/L Final  . CO2 12/20/2012 24  19 - 32 mEq/L Final  . Glucose, Bld 12/20/2012 138* 70 - 99 mg/dL Final  . BUN 57/84/6962 12  6 - 23 mg/dL Final  .  Creatinine, Ser 12/20/2012 1.2  0.4 - 1.5 mg/dL Final  . Total Bilirubin 12/20/2012 0.9  0.3 - 1.2 mg/dL Final  . Alkaline Phosphatase 12/20/2012 37* 39 - 117 U/L Final  . AST 12/20/2012 54* 0 - 37 U/L Final  . ALT 12/20/2012 47  0 - 53 U/L Final  . Total Protein 12/20/2012 7.7  6.0 - 8.3 g/dL Final  . Albumin 95/28/4132 4.2  3.5 - 5.2 g/dL Final  . Calcium 44/04/270 9.6  8.4 - 10.5 mg/dL Final  . GFR 53/66/4403 81.67  >60.00 mL/min Final  Medication List       This list is accurate as of: 12/24/12  3:39 PM.  Always use your most recent med list.               acetaminophen 500 MG tablet  Commonly known as:  TYLENOL  Take 500 mg by mouth as needed. pain     amLODipine 10 MG tablet  Commonly known as:  NORVASC  Take 10 mg by mouth daily.     aspirin 81 MG EC tablet  Take 81 mg by mouth daily.     cabergoline 0.5 MG tablet  Commonly known as:  DOSTINEX  Take 0.25 mg by mouth 2 (two) times a week. Take on Saturday and Monday     doxazosin 2 MG tablet  Commonly known as:  CARDURA  Take 1 tablet (2 mg total) by mouth at bedtime.     glimepiride 4 MG tablet  Commonly known as:  AMARYL  Take 4 mg by mouth daily before breakfast.     KLOR-CON M10 10 MEQ tablet  Generic drug:  potassium chloride  Take 10 mEq by mouth daily.     oxymetazoline 0.05 % nasal spray  Commonly known as:  AFRIN  - Place 2 sprays into the nose as needed. Sinuses  -   -      pioglitazone-metformin 15-850 MG per tablet  Commonly known as:  ACTOPLUS MET  Take 1 tablet by mouth 2 (two) times daily with a meal.     rosuvastatin 5 MG tablet  Commonly known as:  CRESTOR  Take 5 mg by mouth daily.     simvastatin 40 MG tablet  Commonly known as:  ZOCOR  40 mg.     valsartan-hydrochlorothiazide 320-12.5 MG per tablet  Commonly known as:  DIOVAN-HCT     VIAGRA 50 MG tablet  Generic drug:  sildenafil  50 mg.     Vitamin D-3 5000 UNITS Tabs  Take 1 capsule by mouth at bedtime.         Allergies: No Known Allergies  Past Medical History  Diagnosis Date  . Hypertension   . Diabetes mellitus   . Hyperlipemia     Past Surgical History  Procedure Laterality Date  . Joint replacement      No family history on file.  Social History:  reports that he has never smoked. He does not have any smokeless tobacco history on file. He reports that he does not drink alcohol or use illicit drugs.  Review of Systems:  Hypertension:  BP at home systolic 130-138, diastolic 70-82 His amlodipine had been changed previously because of gum hypertrophy but he has gone back on it since he did not feel any different with twitching  Lipids: LDL 59, has been compliant with simvastatin.    He tends to have mild increase in liver functions, possibly from fatty liver  He has had erectile dysfunction.   Examination:   BP 144/72  Pulse 71  Temp(Src) 98.2 F (36.8 C)  Resp 12  Ht 5\' 10"  (1.778 m)  Wt 219 lb 1.6 oz (99.383 kg)  BMI 31.44 kg/m2  SpO2 97%  Body mass index is 31.44 kg/(m^2).    ASSESSMENT/ PLAN::   Diabetes type 2   Blood glucose control is not as good as 3 months ago with slight increase in A1c. Although he tends to have relatively higher readings in the mornings he has not checked readings at other times His main difficulty is  poor compliance with exercise and discussed need to do regular walking or other exercise.  Hypertension: Well controlled and will continue same regimen Hyperlipidemia is controlled with simvastatin  Prolactinoma, this is under control and prolactin is normal with Dostinex, to continue indefinitely  Madeline Bebout 12/24/2012, 3:39 PM

## 2012-12-24 NOTE — Patient Instructions (Addendum)
Please check blood sugars at least half the time about 2 hours after any meal and as directed on waking up. Please bring blood sugar monitor to each visit  Walk daily 

## 2012-12-26 DIAGNOSIS — N529 Male erectile dysfunction, unspecified: Secondary | ICD-10-CM | POA: Insufficient documentation

## 2012-12-26 DIAGNOSIS — D352 Benign neoplasm of pituitary gland: Secondary | ICD-10-CM | POA: Insufficient documentation

## 2013-01-28 ENCOUNTER — Ambulatory Visit: Payer: 59 | Admitting: Cardiology

## 2013-02-07 ENCOUNTER — Other Ambulatory Visit: Payer: Self-pay | Admitting: *Deleted

## 2013-02-07 MED ORDER — VALSARTAN-HYDROCHLOROTHIAZIDE 320-12.5 MG PO TABS: 1.0000 | ORAL_TABLET | Freq: Every day | ORAL | Status: DC

## 2013-02-11 ENCOUNTER — Encounter: Payer: Self-pay | Admitting: Cardiology

## 2013-02-12 ENCOUNTER — Ambulatory Visit: Payer: 59 | Admitting: Cardiology

## 2013-02-13 ENCOUNTER — Other Ambulatory Visit: Payer: Self-pay | Admitting: *Deleted

## 2013-02-13 MED ORDER — SILDENAFIL CITRATE 50 MG PO TABS: 50.0000 mg | ORAL_TABLET | ORAL | Status: DC | PRN

## 2013-03-14 ENCOUNTER — Encounter: Payer: Self-pay | Admitting: Cardiology

## 2013-04-01 ENCOUNTER — Other Ambulatory Visit: Payer: Self-pay | Admitting: *Deleted

## 2013-04-01 MED ORDER — SIMVASTATIN 40 MG PO TABS: 40.0000 mg | ORAL_TABLET | Freq: Every day | ORAL | Status: DC

## 2013-04-25 ENCOUNTER — Other Ambulatory Visit: Payer: 59

## 2013-05-02 ENCOUNTER — Encounter: Payer: Self-pay | Admitting: *Deleted

## 2013-05-02 ENCOUNTER — Ambulatory Visit: Payer: 59 | Admitting: Endocrinology

## 2013-05-14 ENCOUNTER — Other Ambulatory Visit: Payer: 59

## 2013-05-15 ENCOUNTER — Other Ambulatory Visit (INDEPENDENT_AMBULATORY_CARE_PROVIDER_SITE_OTHER): Payer: 59

## 2013-05-15 ENCOUNTER — Ambulatory Visit: Payer: 59 | Admitting: Endocrinology

## 2013-05-15 DIAGNOSIS — N529 Male erectile dysfunction, unspecified: Secondary | ICD-10-CM

## 2013-05-15 DIAGNOSIS — IMO0001 Reserved for inherently not codable concepts without codable children: Secondary | ICD-10-CM

## 2013-05-15 DIAGNOSIS — E1165 Type 2 diabetes mellitus with hyperglycemia: Principal | ICD-10-CM

## 2013-05-15 LAB — COMPREHENSIVE METABOLIC PANEL
ALBUMIN: 4 g/dL (ref 3.5–5.2)
ALT: 51 U/L (ref 0–53)
AST: 56 U/L — ABNORMAL HIGH (ref 0–37)
Alkaline Phosphatase: 38 U/L — ABNORMAL LOW (ref 39–117)
BUN: 10 mg/dL (ref 6–23)
CALCIUM: 9.4 mg/dL (ref 8.4–10.5)
CHLORIDE: 102 meq/L (ref 96–112)
CO2: 29 meq/L (ref 19–32)
Creatinine, Ser: 1 mg/dL (ref 0.4–1.5)
GFR: 95.84 mL/min (ref >60.00)
GLUCOSE: 108 mg/dL — AB (ref 70–99)
POTASSIUM: 3.8 meq/L (ref 3.5–5.1)
SODIUM: 140 meq/L (ref 135–145)
TOTAL PROTEIN: 7.5 g/dL (ref 6.0–8.3)
Total Bilirubin: 0.7 mg/dL (ref 0.3–1.2)

## 2013-05-15 LAB — HEMOGLOBIN A1C: HEMOGLOBIN A1C: 7 % — AB (ref 4.6–6.5)

## 2013-05-20 ENCOUNTER — Encounter: Payer: Self-pay | Admitting: Endocrinology

## 2013-05-20 ENCOUNTER — Ambulatory Visit (INDEPENDENT_AMBULATORY_CARE_PROVIDER_SITE_OTHER): Payer: 59 | Admitting: Endocrinology

## 2013-05-20 VITALS — BP 140/74 | HR 81 | Temp 98.2°F | Resp 14 | Ht 70.0 in | Wt 212.4 lb

## 2013-05-20 DIAGNOSIS — E1165 Type 2 diabetes mellitus with hyperglycemia: Principal | ICD-10-CM

## 2013-05-20 DIAGNOSIS — D353 Benign neoplasm of craniopharyngeal duct: Secondary | ICD-10-CM

## 2013-05-20 DIAGNOSIS — E782 Mixed hyperlipidemia: Secondary | ICD-10-CM

## 2013-05-20 DIAGNOSIS — I10 Essential (primary) hypertension: Secondary | ICD-10-CM

## 2013-05-20 DIAGNOSIS — D352 Benign neoplasm of pituitary gland: Secondary | ICD-10-CM

## 2013-05-20 DIAGNOSIS — E559 Vitamin D deficiency, unspecified: Secondary | ICD-10-CM

## 2013-05-20 DIAGNOSIS — IMO0001 Reserved for inherently not codable concepts without codable children: Secondary | ICD-10-CM

## 2013-05-20 NOTE — Progress Notes (Signed)
Patient ID: Joshua Vargas, male   DOB: 05/09/1945, 68 y.o.   MRN: [id number]   Reason for Appointment: Diabetes follow-up, 5 month visit   History of Present Illness   Diagnosis: Type 2 DIABETES MELITUS, date of diagnosis: 2002      Previous history: He was initially treated with metformin alone and then subsequently was also given Actos and Amaryl. He has had fairly good control but somewhat inconsistent because of difficulties with compliance with diet and exercise periodically. A1c previously has fluctuated between about 6.7-7.6  Recent history: He is still on his usual 3 drug regimen His blood sugars appear to be still fairly well controlled with A1c near 7% He thinks he is usually watching his diet  Since his last visit he has been more motivated to exercise and has been doing a good amount of walking Also has been able to lose weight He is taking maximum dose Amaryl now and occasionally will tend to get low blood sugar symptoms during the night.     Oral hypoglycemic drugs: Actoplusmet and Amaryl $RemoveBe'4mg'nMHITAgyc$ , 2 in evening         Side effects from medications: None       Monitors blood glucose: Once a day.    Glucometer: ? One Touch.          Blood Glucose readings from recall: readings before breakfast: 99-103 in the morning;  upto 160 after meals;  Hypoglycemia frequency:  occasionally overnight his blood sugars in 70s        Meals: 3 meals per day. Less sugar intake and meats         Physical activity: exercise:  Walking 3/7 days a week recently            Dietician visit: Most recent: 9509           Complications: are: Erectile dysfunction   Wt Readings from Last 3 Encounters:  05/20/13 212 lb 6.4 oz (96.344 kg)  12/24/12 219 lb 1.6 oz (99.383 kg)  11/03/10 214 lb (97.07 kg)   LABS:  Lab Results  Component Value Date   HGBA1C 7.0* 05/15/2013   HGBA1C 7.1* 12/20/2012   HGBA1C  Value: 6.3 (NOTE)   The ADA recommends the following therapeutic goals for glycemic   control related  to Hgb A1C measurement:   Goal of Therapy:   < 7.0% Hgb A1C   Action Suggested:  > 8.0% Hgb A1C   Ref:  Diabetes Care, 22, Suppl. 1, 1999* 04/27/2007   Lab Results  Component Value Date   MICROALBUR 1.9 12/20/2012   LDLCALC 59 12/20/2012   CREATININE 1.0 05/15/2013     Appointment on 05/15/2013  Component Date Value Range Status  . Sodium 05/15/2013 140  135 - 145 mEq/L Final  . Potassium 05/15/2013 3.8  3.5 - 5.1 mEq/L Final  . Chloride 05/15/2013 102  96 - 112 mEq/L Final  . CO2 05/15/2013 29  19 - 32 mEq/L Final  . Glucose, Bld 05/15/2013 108* 70 - 99 mg/dL Final  . BUN 05/15/2013 10  6 - 23 mg/dL Final  . Creatinine, Ser 05/15/2013 1.0  0.4 - 1.5 mg/dL Final  . Total Bilirubin 05/15/2013 0.7  0.3 - 1.2 mg/dL Final  . Alkaline Phosphatase 05/15/2013 38* 39 - 117 U/L Final  . AST 05/15/2013 56* 0 - 37 U/L Final  . ALT 05/15/2013 51  0 - 53 U/L Final  . Total Protein 05/15/2013 7.5  6.0 - 8.3 g/dL Final  .  Albumin 05/15/2013 4.0  3.5 - 5.2 g/dL Final  . Calcium 05/15/2013 9.4  8.4 - 10.5 mg/dL Final  . GFR 05/15/2013 95.84  >60.00 mL/min Final  . Hemoglobin A1C 05/15/2013 7.0* 4.6 - 6.5 % Final   Glycemic Control Guidelines for People with Diabetes:Non Diabetic:  <6%Goal of Therapy: <7%Additional Action Suggested:  >8%       Medication List       This list is accurate as of: 05/20/13  4:16 PM.  Always use your most recent med list.               acetaminophen 500 MG tablet  Commonly known as:  TYLENOL  Take 500 mg by mouth as needed. pain     amLODipine 10 MG tablet  Commonly known as:  NORVASC  Take 10 mg by mouth daily.     aspirin 81 MG EC tablet  Take 81 mg by mouth daily.     Azelastine HCl 0.15 % Soln     cabergoline 0.5 MG tablet  Commonly known as:  DOSTINEX  Take 0.25 mg by mouth 2 (two) times a week. Take on Saturday and Monday     CIALIS 5 MG tablet  Generic drug:  tadalafil     doxazosin 2 MG tablet  Commonly known as:  CARDURA  Take 1 tablet (2 mg  total) by mouth at bedtime.     glimepiride 4 MG tablet  Commonly known as:  AMARYL  Take 4 mg by mouth daily before breakfast.     KLOR-CON M10 10 MEQ tablet  Generic drug:  potassium chloride  Take 10 mEq by mouth daily.     montelukast 10 MG tablet  Commonly known as:  SINGULAIR     NASONEX 50 MCG/ACT nasal spray  Generic drug:  mometasone     oxymetazoline 0.05 % nasal spray  Commonly known as:  AFRIN  - Place 2 sprays into the nose as needed. Sinuses  -   -      pioglitazone-metformin 15-850 MG per tablet  Commonly known as:  ACTOPLUS MET  Take 1 tablet by mouth 2 (two) times daily with a meal.     rosuvastatin 5 MG tablet  Commonly known as:  CRESTOR  Take 5 mg by mouth daily.     sildenafil 50 MG tablet  Commonly known as:  VIAGRA  Take 1 tablet (50 mg total) by mouth as needed for erectile dysfunction.     simvastatin 40 MG tablet  Commonly known as:  ZOCOR  Take 1 tablet (40 mg total) by mouth daily.     valsartan-hydrochlorothiazide 320-12.5 MG per tablet  Commonly known as:  DIOVAN-HCT  Take 1 tablet by mouth daily.     Vitamin D-3 5000 UNITS Tabs  Take 1 capsule by mouth at bedtime.        Allergies: No Known Allergies  Past Medical History  Diagnosis Date  . Hypertension   . Diabetes mellitus   . Hyperlipemia     Past Surgical History  Procedure Laterality Date  . Joint replacement      No family history on file.  Social History:  reports that he has never smoked. He does not have any smokeless tobacco history on file. He reports that he does not drink alcohol or use illicit drugs.  Review of Systems:  Hypertension:  BP at home systolic 244-010, diastolic 27-25 His amlodipine had been changed previously because of gum hypertrophy but he has not  had any issues with this now  Lipids: LDL well controlled, has been compliant with simvastatin.    He tends to have mild increase in liver functions, possibly from fatty liver. 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 He has had better libido with consistently treating this with Dostinex. Last prolactin level was 15.3 Also testosterone level previously has been normal He has had erectile dysfunction.   Examination:   BP 140/74  Pulse 81  Temp(Src) 98.2 F (36.8 C)  Resp 14  Ht 5' 10"  (1.778 m)  Wt 212 lb 6.4 oz (96.344 kg)  BMI 30.48 kg/m2  SpO2 96%  Body mass index is 30.48 kg/(m^2).    ASSESSMENT/ PLAN::   Diabetes type 2   Blood glucose control is relatively better although still has slight increase in A1c. Although he tends to have fairly good readings on waking up he is not reporting occasionally overnight hypoglycemia This is probably from his weight loss and exercise regimen Will have him reduce his Amaryl to 4 mg in the evenings  Hypertension: Well controlled and will continue same regimen  Prolactinoma: Consider reducing Dostinex prolactin lower  Scotlyn Mccranie 05/20/2013, 4:16 PM

## 2013-05-20 NOTE — Patient Instructions (Signed)
Take only 1 Glimeperide in pms

## 2013-05-29 ENCOUNTER — Other Ambulatory Visit: Payer: Self-pay | Admitting: *Deleted

## 2013-05-29 MED ORDER — DOXAZOSIN MESYLATE 2 MG PO TABS: 2.0000 mg | ORAL_TABLET | Freq: Every day | ORAL | Status: DC

## 2013-05-30 ENCOUNTER — Encounter: Payer: Self-pay | Admitting: Cardiology

## 2013-08-05 ENCOUNTER — Other Ambulatory Visit: Payer: Self-pay | Admitting: Endocrinology

## 2013-08-16 ENCOUNTER — Other Ambulatory Visit: Payer: Self-pay | Admitting: *Deleted

## 2013-08-16 MED ORDER — GLIMEPIRIDE 4 MG PO TABS: ORAL_TABLET | ORAL | Status: DC

## 2013-09-17 ENCOUNTER — Other Ambulatory Visit (INDEPENDENT_AMBULATORY_CARE_PROVIDER_SITE_OTHER): Payer: 59

## 2013-09-17 DIAGNOSIS — E559 Vitamin D deficiency, unspecified: Secondary | ICD-10-CM

## 2013-09-17 DIAGNOSIS — E1165 Type 2 diabetes mellitus with hyperglycemia: Principal | ICD-10-CM

## 2013-09-17 DIAGNOSIS — IMO0001 Reserved for inherently not codable concepts without codable children: Secondary | ICD-10-CM

## 2013-09-17 DIAGNOSIS — D352 Benign neoplasm of pituitary gland: Secondary | ICD-10-CM

## 2013-09-17 LAB — MICROALBUMIN / CREATININE URINE RATIO
CREATININE, U: 110.5 mg/dL
MICROALB UR: 1.7 mg/dL (ref 0.0–1.9)
Microalb Creat Ratio: 1.5 mg/g (ref 0.0–30.0)

## 2013-09-17 LAB — URINALYSIS, ROUTINE W REFLEX MICROSCOPIC
BILIRUBIN URINE: NEGATIVE
HGB URINE DIPSTICK: NEGATIVE
KETONES UR: NEGATIVE
Leukocytes, UA: NEGATIVE
Nitrite: NEGATIVE
Specific Gravity, Urine: 1.015 (ref 1.000–1.030)
TOTAL PROTEIN, URINE-UPE24: NEGATIVE
URINE GLUCOSE: NEGATIVE
UROBILINOGEN UA: 0.2 (ref 0.0–1.0)
pH: 6 (ref 5.0–8.0)

## 2013-09-17 LAB — COMPREHENSIVE METABOLIC PANEL
ALBUMIN: 4.1 g/dL (ref 3.5–5.2)
ALK PHOS: 38 U/L — AB (ref 39–117)
ALT: 70 U/L — AB (ref 0–53)
AST: 103 U/L — ABNORMAL HIGH (ref 0–37)
BUN: 16 mg/dL (ref 6–23)
CHLORIDE: 103 meq/L (ref 96–112)
CO2: 27 mEq/L (ref 19–32)
Calcium: 9.7 mg/dL (ref 8.4–10.5)
Creatinine, Ser: 1.1 mg/dL (ref 0.4–1.5)
GFR: 87.61 mL/min (ref >60.00)
Glucose, Bld: 119 mg/dL — ABNORMAL HIGH (ref 70–99)
Potassium: 4 mEq/L (ref 3.5–5.1)
SODIUM: 139 meq/L (ref 135–145)
Total Bilirubin: 0.7 mg/dL (ref 0.2–1.2)
Total Protein: 7.3 g/dL (ref 6.0–8.3)

## 2013-09-17 LAB — CBC
HEMATOCRIT: 43.1 % (ref 39.0–52.0)
HEMOGLOBIN: 14.2 g/dL (ref 13.0–17.0)
MCHC: 33 g/dL (ref 30.0–36.0)
MCV: 85.2 fl (ref 78.0–100.0)
PLATELETS: 206 10*3/uL (ref 150.0–400.0)
RBC: 5.06 Mil/uL (ref 4.22–5.81)
RDW: 14.5 % (ref 11.5–15.5)
WBC: 3.7 10*3/uL — AB (ref 4.0–10.5)

## 2013-09-17 LAB — LIPID PANEL
Cholesterol: 174 mg/dL (ref 0–200)
HDL: 57.9 mg/dL (ref >39.00)
LDL CALC: 88 mg/dL (ref 0–99)
NONHDL: 116.1
TRIGLYCERIDES: 142 mg/dL (ref 0.0–149.0)
Total CHOL/HDL Ratio: 3
VLDL: 28.4 mg/dL (ref 0.0–40.0)

## 2013-09-17 LAB — HEMOGLOBIN A1C: HEMOGLOBIN A1C: 6.8 % — AB (ref 4.6–6.5)

## 2013-09-18 LAB — PROLACTIN: Prolactin: 15.6 ng/mL (ref 2.1–17.1)

## 2013-09-18 LAB — VITAMIN D 25 HYDROXY (VIT D DEFICIENCY, FRACTURES): VIT D 25 HYDROXY: 33 ng/mL (ref 30–89)

## 2013-09-20 ENCOUNTER — Ambulatory Visit (INDEPENDENT_AMBULATORY_CARE_PROVIDER_SITE_OTHER): Payer: 59 | Admitting: Endocrinology

## 2013-09-20 ENCOUNTER — Encounter: Payer: Self-pay | Admitting: Endocrinology

## 2013-09-20 ENCOUNTER — Other Ambulatory Visit: Payer: Self-pay | Admitting: *Deleted

## 2013-09-20 VITALS — BP 172/94 | HR 76 | Temp 98.5°F | Resp 14 | Ht 70.0 in | Wt 216.6 lb

## 2013-09-20 DIAGNOSIS — IMO0001 Reserved for inherently not codable concepts without codable children: Secondary | ICD-10-CM

## 2013-09-20 DIAGNOSIS — E782 Mixed hyperlipidemia: Secondary | ICD-10-CM

## 2013-09-20 DIAGNOSIS — E1165 Type 2 diabetes mellitus with hyperglycemia: Principal | ICD-10-CM

## 2013-09-20 DIAGNOSIS — R7989 Other specified abnormal findings of blood chemistry: Secondary | ICD-10-CM

## 2013-09-20 DIAGNOSIS — R945 Abnormal results of liver function studies: Secondary | ICD-10-CM

## 2013-09-20 DIAGNOSIS — D352 Benign neoplasm of pituitary gland: Secondary | ICD-10-CM

## 2013-09-20 DIAGNOSIS — I10 Essential (primary) hypertension: Secondary | ICD-10-CM

## 2013-09-20 DIAGNOSIS — D353 Benign neoplasm of craniopharyngeal duct: Secondary | ICD-10-CM

## 2013-09-20 MED ORDER — LIRAGLUTIDE 18 MG/3ML ~~LOC~~ SOPN: PEN_INJECTOR | SUBCUTANEOUS | Status: DC

## 2013-09-20 NOTE — Patient Instructions (Addendum)
Please check blood sugars at least half the time about 2 hours after any meal and 2 times per week on waking up. Please bring blood sugar monitor to each visit  Leave off Crestor for 1 month  Start VICTOZA injection with the pen once daily at the same time of the day.  Dial the dose to 0.6 mg for the first week.  You may possibly experience nausea in the first few days which usually gets better in a couple of days After 1 week increase the dose to 1.2mg  daily if no nausea present.  You may inject in the stomach, thigh or arm.    You will feel fullness of the stomach with starting the medication and should try to keep portions of food small.  Call us or the [place] helpline at [phone] or visit [email or web address] for any questions  Reduce Amaryl to 1/2 for 1 week and then stop

## 2013-09-20 NOTE — Progress Notes (Signed)
Patient ID: Joshua Vargas, male   DOB: July 08, 1945, 68 y.o.   MRN: [id number]   Reason for Appointment: Diabetes follow-up, 5 month visit   History of Present Illness   Diagnosis: Type 2 DIABETES MELITUS, date of diagnosis: 2002      Previous history: He was initially treated with metformin alone and then subsequently was also given Actos and Amaryl. He has had fairly good control but somewhat inconsistent because of difficulties with compliance with diet and exercise periodically. A1c previously has fluctuated between about 6.7-7.6  Recent history: He is still on his usual 3 drug regimen His blood sugars appear to be still fairly well controlled with A1c under 7% He thinks he is usually watching his diet  Since his last visit he has been less motivated to exercise and has gained back some of the weight she had lost He is taking the 4 mg dose Amaryl since his last visit and has not had any symptoms of hypoglycemia as he was having before He is asking about Victoza which he has learned about the media and wants to lose weight     Oral hypoglycemic drugs: Actoplusmet twice a day and Amaryl 75m in evening         Side effects from medications: None       Monitors blood glucose: ? Frequency    Glucometer: ? One Touch.          Blood Glucose readings from recall: readings before breakfast: 99-128 in the morning;  upto 148 after meals;  Hypoglycemia frequency:  occasionally overnight his blood sugars in 70s        Meals: 3 meals per day. Less sugar intake and meats         Physical activity: exercise:  Walking less, busy             Dietician visit: Most recent: [id number]          Complications: are: Erectile dysfunction   Wt Readings from Last 3 Encounters:  09/20/13 216 lb 9.6 oz (98.249 kg)  05/20/13 212 lb 6.4 oz (96.344 kg)  12/24/12 219 lb 1.6 oz (99.383 kg)   LABS:  Lab Results  Component Value Date   HGBA1C 6.8* 09/17/2013   HGBA1C 7.0* 05/15/2013   HGBA1C 7.1* 12/20/2012   Lab  Results  Component Value Date   MICROALBUR 1.7 09/17/2013   LDLCALC 88 09/17/2013   CREATININE 1.1 09/17/2013     Appointment on 09/17/2013  Component Date Value Ref Range Status  . Microalb, Ur 09/17/2013 1.7  0.0 - 1.9 mg/dL Final  . Creatinine,U 09/17/2013 110.5   Final  . Microalb Creat Ratio 09/17/2013 1.5  0.0 - 30.0 mg/g Final  . Color, Urine 09/17/2013 YELLOW  Yellow;Lt. Yellow Final  . APPearance 09/17/2013 CLEAR  Clear Final  . Specific Gravity, Urine 09/17/2013 1.015  1.000-1.030 Final  . pH 09/17/2013 6.0  5.0 - 8.0 Final  . Total Protein, Urine 09/17/2013 NEGATIVE  Negative Final  . Urine Glucose 09/17/2013 NEGATIVE  Negative Final  . Ketones, ur 09/17/2013 NEGATIVE  Negative Final  . Bilirubin Urine 09/17/2013 NEGATIVE  Negative Final  . Hgb urine dipstick 09/17/2013 NEGATIVE  Negative Final  . Urobilinogen, UA 09/17/2013 0.2  0.0 - 1.0 Final  . Leukocytes, UA 09/17/2013 NEGATIVE  Negative Final  . Nitrite 09/17/2013 NEGATIVE  Negative Final  . WBC, UA 09/17/2013 0-2/hpf  0-2/hpf Final  . RBC / HPF 09/17/2013 0-2/hpf  0-2/hpf Final  .  Squamous Epithelial / LPF 09/17/2013 Rare(0-4/hpf)  Rare(0-4/hpf) Final  . Prolactin 09/17/2013 15.6  2.1 - 17.1 ng/mL Final   Comment:      Reference Ranges:                                           Male:                       2.1 -  17.1 ng/ml                                           Male:   Pregnant          9.7 - 208.5 ng/mL                                                     Non Pregnant      2.8 -  29.2 ng/mL                                                     Post Menopausal   1.8 -  20.3 ng/mL                                              . Vit D, 25-Hydroxy 09/17/2013 33  30 - 89 ng/mL Final   Comment: This assay accurately quantifies Vitamin D, which is the sum of the                          25-Hydroxy forms of Vitamin D2 and D3.  Studies have shown that the                          optimum concentration of 25-Hydroxy Vitamin  D is 30 ng/mL or higher.                           Concentrations of Vitamin D between 20 and 29 ng/mL are considered to                          be insufficient and concentrations less than 20 ng/mL are considered                          to be deficient for Vitamin D.  . Hemoglobin A1C 09/17/2013 6.8* 4.6 - 6.5 % Final   Glycemic Control Guidelines for People with Diabetes:Non Diabetic:  <6%Goal of Therapy: <7%Additional Action Suggested:  >8%   . Sodium 09/17/2013 139  135 - 145 mEq/L Final  . Potassium 09/17/2013 4.0  3.5 - 5.1 mEq/L Final  . Chloride 09/17/2013 103  96 - 112 mEq/L Final  .  CO2 09/17/2013 27  19 - 32 mEq/L Final  . Glucose, Bld 09/17/2013 119* 70 - 99 mg/dL Final  . BUN 09/17/2013 16  6 - 23 mg/dL Final  . Creatinine, Ser 09/17/2013 1.1  0.4 - 1.5 mg/dL Final  . Total Bilirubin 09/17/2013 0.7  0.2 - 1.2 mg/dL Final  . Alkaline Phosphatase 09/17/2013 38* 39 - 117 U/L Final  . AST 09/17/2013 103* 0 - 37 U/L Final  . ALT 09/17/2013 70* 0 - 53 U/L Final  . Total Protein 09/17/2013 7.3  6.0 - 8.3 g/dL Final  . Albumin 09/17/2013 4.1  3.5 - 5.2 g/dL Final  . Calcium 09/17/2013 9.7  8.4 - 10.5 mg/dL Final  . GFR 09/17/2013 87.61  >60.00 mL/min Final  . Cholesterol 09/17/2013 174  0 - 200 mg/dL Final   ATP III Classification       Desirable:  < 200 mg/dL               Borderline High:  200 - 239 mg/dL          High:  > = 240 mg/dL  . Triglycerides 09/17/2013 142.0  0.0 - 149.0 mg/dL Final   Normal:  <150 mg/dLBorderline High:  150 - 199 mg/dL  . HDL 09/17/2013 57.90  >39.00 mg/dL Final  . VLDL 09/17/2013 28.4  0.0 - 40.0 mg/dL Final  . LDL Cholesterol 09/17/2013 88  0 - 99 mg/dL Final  . Total CHOL/HDL Ratio 09/17/2013 3   Final                  Men          Women1/2 Average Risk     3.4          3.3Average Risk          5.0          4.42X Average Risk          9.6          7.13X Average Risk          15.0          11.0                      . NonHDL 09/17/2013 116.10    Final  . WBC 09/17/2013 3.7* 4.0 - 10.5 K/uL Final  . RBC 09/17/2013 5.06  4.22 - 5.81 Mil/uL Final  . Platelets 09/17/2013 206.0  150.0 - 400.0 K/uL Final  . Hemoglobin 09/17/2013 14.2  13.0 - 17.0 g/dL Final  . HCT 09/17/2013 43.1  39.0 - 52.0 % Final  . MCV 09/17/2013 85.2  78.0 - 100.0 fl Final  . MCHC 09/17/2013 33.0  30.0 - 36.0 g/dL Final  . RDW 09/17/2013 14.5  11.5 - 15.5 % Final      Medication List       This list is accurate as of: 09/20/13  3:44 PM.  Always use your most recent med list.               acetaminophen 500 MG tablet  Commonly known as:  TYLENOL  Take 500 mg by mouth as needed. pain     amLODipine 10 MG tablet  Commonly known as:  NORVASC  Take 10 mg by mouth daily.     aspirin 81 MG EC tablet  Take 81 mg by mouth daily.     Azelastine HCl 0.15 % Soln     cabergoline 0.5 MG  tablet  Commonly known as:  DOSTINEX  Take 0.25 mg by mouth 2 (two) times a week. Take on Saturday and Monday     CIALIS 5 MG tablet  Generic drug:  tadalafil     doxazosin 2 MG tablet  Commonly known as:  CARDURA  Take 1 tablet (2 mg total) by mouth at bedtime.     glimepiride 4 MG tablet  Commonly known as:  AMARYL  Take two tablets by mouth every day     KLOR-CON M10 10 MEQ tablet  Generic drug:  potassium chloride  Take 10 mEq by mouth daily.     montelukast 10 MG tablet  Commonly known as:  SINGULAIR     NASONEX 50 MCG/ACT nasal spray  Generic drug:  mometasone     oxymetazoline 0.05 % nasal spray  Commonly known as:  AFRIN  - Place 2 sprays into the nose as needed. Sinuses  -   -      pioglitazone-metformin 15-850 MG per tablet  Commonly known as:  ACTOPLUS MET  Take 1 tablet by mouth 2 (two) times daily with a meal.     rosuvastatin 5 MG tablet  Commonly known as:  CRESTOR  Take 5 mg by mouth daily.     sildenafil 50 MG tablet  Commonly known as:  VIAGRA  Take 1 tablet (50 mg total) by mouth as needed for erectile dysfunction.      simvastatin 40 MG tablet  Commonly known as:  ZOCOR  Take 1 tablet (40 mg total) by mouth daily.     valsartan-hydrochlorothiazide 320-12.5 MG per tablet  Commonly known as:  DIOVAN-HCT  TAKE ONE TABLET BY MOUTH ONCE DAILY     Vitamin D-3 5000 UNITS Tabs  Take 1 capsule by mouth at bedtime.        Allergies: No Known Allergies  Past Medical History  Diagnosis Date  . Hypertension   . Diabetes mellitus   . Hyperlipemia     Past Surgical History  Procedure Laterality Date  . Joint replacement      No family history on file.  Social History:  reports that he has never smoked. He does not have any smokeless tobacco history on file. He reports that he does not drink alcohol or use illicit drugs.  Review of Systems:  Less allergies now, previously seen by allergist  Joint pain: he has had intermittent knee joint pains and sometimes has more significant pain making it difficult to walk. However he has previously been in a walking program. He wants a handicapped permit continued  Hypertension:  BP at home usually he is compliant with his medications but recently one of his medications probably Diovan HCT is not refilled  Lipids: LDL well controlled, has been compliant with Crestor  Lab Results  Component Value Date   CHOL 174 09/17/2013   HDL 57.90 09/17/2013   LDLCALC 88 09/17/2013   TRIG 142.0 09/17/2013   CHOLHDL 3 09/17/2013      He tends to have mild increase in liver functions, possibly from fatty liver. 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 He has had better libido with consistently treating this with Dostinex. Last prolactin level was 15.3 Also testosterone level previously has been normal He has had erectile dysfunction.   Examination:  BP 172/94  Pulse 76  Temp(Src)  98.5 F (36.9 C)  Resp 14  Ht 5' 10"  (1.778 m)  Wt 216 lb 9.6 oz (98.249 kg)  BMI 31.08 kg/m2  SpO2 95%  Body mass index is 31.08 kg/(m^2).    ASSESSMENT/ PLAN:   Diabetes type 2   Blood glucose control is relatively better with A1c now less than 7% Again does not bring his monitor for review and not clear what his blood sugar patterns are He has gained a little weight back, partly from decreased compliance with exercise He is asking about taking Victoza and this may be a good option as he may be able to lose weight more consistently and eliminate Amaryl Discussed with the patient the nature of GLP-1 drugs, the action on various organ systems, how they benefit blood glucose control, as well as the benefit of weight loss and  increase satiety . Explained possible side effects especially nausea and vomiting; discussed safety information in package insert. Described injection technique and dosage titration of Victoza  starting with 0.6 mg once a day at the same time for the first week and then increasing to 1.2 mg if no symptoms of nausea. Patient brochure on Victoza and co-pay card given  Will have him reduce his Amaryl to 2 mg in about a week and try to stop in about 2 weeks  Hypertension: blood pressure is much higher and he thinks this is from running out of his Diovan HCT a few days ago. Encouraged him to be better compliant with his medications He will continue to monitor at home also  Abnormal liver functions: Not clear if this is related to fatty liver or his statin drug. Will have him hold his, Needs recheck in a couple months Crestor until the next visit  Prolactinoma: continue same dose of Dostinex his prolactin level is stable  Maudene Stotler 09/20/2013, 3:44 PM

## 2013-09-25 ENCOUNTER — Telehealth: Payer: Self-pay | Admitting: Endocrinology

## 2013-09-25 NOTE — Telephone Encounter (Signed)
Pt called stated that [facility] told him that we need to do PA for victoza. Please advise.

## 2013-10-28 ENCOUNTER — Other Ambulatory Visit: Payer: Self-pay | Admitting: *Deleted

## 2013-10-28 MED ORDER — CABERGOLINE 0.5 MG PO TABS: 0.2500 mg | ORAL_TABLET | ORAL | Status: DC

## 2013-11-01 ENCOUNTER — Other Ambulatory Visit: Payer: Self-pay | Admitting: *Deleted

## 2013-11-05 ENCOUNTER — Telehealth: Payer: Self-pay

## 2013-11-05 NOTE — Telephone Encounter (Signed)
Diabetic Bundle. Left message to call back to set up appointment with Dr. Dwyane Dee.

## 2013-11-20 ENCOUNTER — Other Ambulatory Visit (INDEPENDENT_AMBULATORY_CARE_PROVIDER_SITE_OTHER): Payer: 59

## 2013-11-20 DIAGNOSIS — R945 Abnormal results of liver function studies: Secondary | ICD-10-CM

## 2013-11-20 DIAGNOSIS — R7989 Other specified abnormal findings of blood chemistry: Secondary | ICD-10-CM

## 2013-11-20 LAB — COMPREHENSIVE METABOLIC PANEL
ALT: 71 U/L — ABNORMAL HIGH (ref 0–53)
AST: 77 U/L — ABNORMAL HIGH (ref 0–37)
Albumin: 3.8 g/dL (ref 3.5–5.2)
Alkaline Phosphatase: 45 U/L (ref 39–117)
BILIRUBIN TOTAL: 0.5 mg/dL (ref 0.2–1.2)
BUN: 12 mg/dL (ref 6–23)
CO2: 29 mEq/L (ref 19–32)
CREATININE: 1.1 mg/dL (ref 0.4–1.5)
Calcium: 9.6 mg/dL (ref 8.4–10.5)
Chloride: 99 mEq/L (ref 96–112)
GFR: 83.1 mL/min (ref >60.00)
Glucose, Bld: 103 mg/dL — ABNORMAL HIGH (ref 70–99)
Potassium: 3.3 mEq/L — ABNORMAL LOW (ref 3.5–5.1)
Sodium: 137 mEq/L (ref 135–145)
Total Protein: 7.4 g/dL (ref 6.0–8.3)

## 2013-11-25 ENCOUNTER — Ambulatory Visit (INDEPENDENT_AMBULATORY_CARE_PROVIDER_SITE_OTHER): Payer: 59 | Admitting: Endocrinology

## 2013-11-25 ENCOUNTER — Encounter: Payer: Self-pay | Admitting: Endocrinology

## 2013-11-25 VITALS — BP 140/82 | HR 87 | Temp 98.1°F | Resp 16 | Ht 70.0 in | Wt 211.6 lb

## 2013-11-25 DIAGNOSIS — R945 Abnormal results of liver function studies: Secondary | ICD-10-CM

## 2013-11-25 DIAGNOSIS — I10 Essential (primary) hypertension: Secondary | ICD-10-CM

## 2013-11-25 DIAGNOSIS — D353 Benign neoplasm of craniopharyngeal duct: Secondary | ICD-10-CM

## 2013-11-25 DIAGNOSIS — IMO0001 Reserved for inherently not codable concepts without codable children: Secondary | ICD-10-CM

## 2013-11-25 DIAGNOSIS — E876 Hypokalemia: Secondary | ICD-10-CM

## 2013-11-25 DIAGNOSIS — R7989 Other specified abnormal findings of blood chemistry: Secondary | ICD-10-CM

## 2013-11-25 DIAGNOSIS — D352 Benign neoplasm of pituitary gland: Secondary | ICD-10-CM

## 2013-11-25 DIAGNOSIS — E1165 Type 2 diabetes mellitus with hyperglycemia: Principal | ICD-10-CM

## 2013-11-25 NOTE — Patient Instructions (Addendum)
Glimeperide 1/2 tab at dinner  Restart Doxazosin and potassium  Victoza 0.9mg  daily  Check to see if taking Simvastatin, stop this for now

## 2013-11-25 NOTE — Progress Notes (Signed)
Patient ID: Joshua Vargas, male   DOB: [birth year], 68 y.o.   MRN: [id number]   Reason for Appointment: follow-up of various issues  History of Present Illness   Diagnosis: Type 2 DIABETES MELITUS, date of diagnosis: 2002      Previous history: He was initially treated with metformin alone and then subsequently was also given Actos and Amaryl. He has had fairly good control but somewhat inconsistent because of difficulties with compliance with diet and exercise periodically. A1c previously has fluctuated between about 6.7-7.6  Recent history:  In 6/15 he was started on Victoza which he wanted to try because of difficulty losing weight and better control His last A1c was 6.8 With this he is able to follow his diet more consistently and had lost 5 pounds However he said he does not feel hungry at all. Eating his meals simply because if feels he has to eat and avoid hypoglycemia. No nausea. Also he has been to stop his Amaryl in the morning. He has brought his monitor for review today and he appears to have excellent blood sugars with only one reading over 150 Also has had one episode of mild hypoglycemia in the afternoon     Oral hypoglycemic drugs: Actoplusmet twice a day and Amaryl 4mg  in evening         Side effects from medications: None       Monitors blood glucose: ? Frequency    Glucometer:  One Touch.          Blood Glucose readings from monitor download: readings before breakfast: 87-160, nonfasting 61-149 Overall median 102 Hypoglycemia frequency:  only once after lunch        Meals: 3 meals per day.reducing sweets, snacks and meats         Physical activity: exercise:  Walking off and on           Dietician visit: Most recent: [id number]           Complications: are: Erectile dysfunction   Wt Readings from Last 3 Encounters:  11/25/13 211 lb 9.6 oz (95.981 kg)  09/20/13 216 lb 9.6 oz (98.249 kg)  05/20/13 212 lb 6.4 oz (96.344 kg)   LABS:  Lab Results  Component Value Date    HGBA1C 6.8* 09/17/2013   HGBA1C 7.0* 05/15/2013   HGBA1C 7.1* 12/20/2012   Lab Results  Component Value Date   MICROALBUR 1.7 09/17/2013   LDLCALC 88 09/17/2013   CREATININE 1.1 11/20/2013     Appointment on 11/20/2013  Component Date Value Ref Range Status  . Sodium 11/20/2013 137  135 - 145 mEq/L Final  . Potassium 11/20/2013 3.3* 3.5 - 5.1 mEq/L Final  . Chloride 11/20/2013 99  96 - 112 mEq/L Final  . CO2 11/20/2013 29  19 - 32 mEq/L Final  . Glucose, Bld 11/20/2013 103* 70 - 99 mg/dL Final  . BUN 11/20/2013 12  6 - 23 mg/dL Final  . Creatinine, Ser 11/20/2013 1.1  0.4 - 1.5 mg/dL Final  . Total Bilirubin 11/20/2013 0.5  0.2 - 1.2 mg/dL Final  . Alkaline Phosphatase 11/20/2013 45  39 - 117 U/L Final  . AST 11/20/2013 77* 0 - 37 U/L Final  . ALT 11/20/2013 71* 0 - 53 U/L Final  . Total Protein 11/20/2013 7.4  6.0 - 8.3 g/dL Final  . Albumin 11/20/2013 3.8  3.5 - 5.2 g/dL Final  . Calcium 11/20/2013 9.6  8.4 - 10.5 mg/dL Final  . GFR 11/20/2013 83.10  >  60.00 mL/min Final      Medication List       This list is accurate as of: 11/25/13  3:32 PM.  Always use your most recent med list.               acetaminophen 500 MG tablet  Commonly known as:  TYLENOL  Take 500 mg by mouth as needed. pain     amLODipine 10 MG tablet  Commonly known as:  NORVASC  Take 10 mg by mouth daily.     aspirin 81 MG EC tablet  Take 81 mg by mouth daily.     Azelastine HCl 0.15 % Soln     cabergoline 0.5 MG tablet  Commonly known as:  DOSTINEX  Take 0.5 tablets (0.25 mg total) by mouth 2 (two) times a week. Take on Saturday and Monday     CIALIS 5 MG tablet  Generic drug:  tadalafil     doxazosin 2 MG tablet  Commonly known as:  CARDURA  Take 1 tablet (2 mg total) by mouth at bedtime.     glimepiride 4 MG tablet  Commonly known as:  AMARYL  Take two tablets by mouth every day     KLOR-CON M10 10 MEQ tablet  Generic drug:  potassium chloride  Take 10 mEq by mouth daily.      Liraglutide 18 MG/3ML Sopn  Inject 1.2 mg daily     montelukast 10 MG tablet  Commonly known as:  SINGULAIR     sildenafil 50 MG tablet  Commonly known as:  VIAGRA  Take 1 tablet (50 mg total) by mouth as needed for erectile dysfunction.     simvastatin 40 MG tablet  Commonly known as:  ZOCOR     valsartan-hydrochlorothiazide 320-12.5 MG per tablet  Commonly known as:  DIOVAN-HCT  TAKE ONE TABLET BY MOUTH ONCE DAILY     Vitamin D-3 5000 UNITS Tabs  Take 1 capsule by mouth at bedtime.        Allergies:  Allergies  Allergen Reactions  . Atorvastatin Itching  . Dextran Rash    Other Reaction: Not Assessed    Past Medical History  Diagnosis Date  . Hypertension   . Diabetes mellitus   . Hyperlipemia     Past Surgical History  Procedure Laterality Date  . Joint replacement      No family history on file.  Social History:  reports that he has never smoked. He does not have any smokeless tobacco history on file. He reports that he does not drink alcohol or use illicit drugs.  Review of Systems:  Hypertension:  BP at home usually fairly well controlled He is again not sure which medication he is not taking, most likely not taking his doxazosin since his potassium is low from his Diovan HCTZ  Lipids: LDL well controlled, had been on Crestor  Lab Results  Component Value Date   CHOL 174 09/17/2013   HDL 57.90 09/17/2013   LDLCALC 88 09/17/2013   TRIG 142.0 09/17/2013   CHOLHDL 3 09/17/2013      He tends to have mild increase in liver functions, possibly from fatty liver although his ultrasound was normal in 2011. Evaluation from Dr. Collene Mares not available in the record He has not had any alcohol intake, nonsteroidal anti-inflammatory drugs On his last visit he was told to leave off Crestor but somehow he is getting simvastatin from an old prescription Liver functions are slightly better but not normal  He has had a prolactinoma since 2007 with baseline prolactin level of  101. He has had better libido with consistently treating this with Dostinex. Last prolactin level was 15.3 Also testosterone level previously has been normal He has had erectile dysfunction.   Examination:   BP 145/86  Pulse 87  Temp(Src) 98.1 F (36.7 C)  Resp 16  Ht 5\' 10"  (1.778 m)  Wt 211 lb 9.6 oz (95.981 kg)  BMI 30.36 kg/m2  SpO2 96%  Body mass index is 30.36 kg/(m^2).   Repeat blood pressure 140/82 No ankle edema present   ASSESSMENT/ PLAN:   Diabetes type 2   Blood glucose control is relatively better with starting VictozaAnd he also has lost some weight  He is complaining of anorexia with  the 1.2 mg dosage  and may do well with lower doses Demonstrated how to dial 5 clicks beyond the 0.6 mg dose Also has been able to reduce Amaryl Only once has had mild hypoglycemia He does need to be regular with his exercise regimen Also discussed checking postprandial readings consistently at various times A1c on the next visit  Hypertension: blood pressure is high normal today but he thinks it is better at home He is again somewhat confused about what medications he is taking and has not refilled his doxazosin most likely for now since blood pressure is only minimally increased and better at home will not restart doxazosin  Hypokalemia: This is likely to be from Diovan HCT and will need to resume his potassium supplementation which he is not doing  Abnormal liver functions: Again possibly from  statin drug, See discussion above.  Appears to be taking simvastatin from an older prescription; Crestor was stopped on his last visit. Will have him hold his simvastatin and will  recheck in a couple months Consider GI consultation again if persistently high  Patient Instructions  Glimeperide 1/2 tab at dinner  Restart Doxazosin and potassium  Victoza 0.9mg  daily  Check to see if taking Simvastatin, stop this for now     [facility] 11/25/2013, 3:32 PM

## 2013-12-05 ENCOUNTER — Other Ambulatory Visit: Payer: Self-pay | Admitting: *Deleted

## 2013-12-05 MED ORDER — LIRAGLUTIDE 18 MG/3ML ~~LOC~~ SOPN: PEN_INJECTOR | SUBCUTANEOUS | Status: DC

## 2014-01-31 ENCOUNTER — Other Ambulatory Visit: Payer: Self-pay | Admitting: Endocrinology

## 2014-01-31 ENCOUNTER — Telehealth: Payer: Self-pay | Admitting: Endocrinology

## 2014-01-31 NOTE — Telephone Encounter (Signed)
rx sent

## 2014-01-31 NOTE — Telephone Encounter (Signed)
Pt needs refill on blood pressure meds please pts blood pressure was 148/101 this am  Pt has had a headache for the past couple days.

## 2014-02-10 ENCOUNTER — Telehealth: Payer: Self-pay | Admitting: Endocrinology

## 2014-02-10 ENCOUNTER — Other Ambulatory Visit: Payer: Self-pay | Admitting: *Deleted

## 2014-02-10 MED ORDER — VALSARTAN-HYDROCHLOROTHIAZIDE 320-12.5 MG PO TABS: ORAL_TABLET | ORAL | Status: DC

## 2014-02-10 NOTE — Telephone Encounter (Signed)
Restart doxazosin 2 mg daily and schedule follow-up in about 2-3 weeks with labs, does not have an appointment

## 2014-02-10 NOTE — Telephone Encounter (Signed)
Patient states he restarted the doxazosin last Wednesday and it hasn't helped his bp. Please advise.

## 2014-02-10 NOTE — Telephone Encounter (Signed)
Please see below and advise.

## 2014-02-10 NOTE — Telephone Encounter (Signed)
Patient states that his blood pressure has been high  B/P 159-96 11:15am         186/96 last week  Patient has been experiencing headaches and sick to stomach   Please advise patient   Thank you

## 2014-02-10 NOTE — Telephone Encounter (Signed)
Is he taking amlodipine and valsartan HCTZ also. If he is will need to have him stop doxazosin and start Tenex 2 mg at bedtime and watch sodium intake

## 2014-02-10 NOTE — Telephone Encounter (Signed)
Restart Diovan, no Tenex

## 2014-02-10 NOTE — Telephone Encounter (Signed)
Patient said you told him to stop amlodipine when he started the Glimepiride, and he's not taking valsartan, patient has severe headaches. Please advise

## 2014-02-17 ENCOUNTER — Other Ambulatory Visit: Payer: Self-pay | Admitting: *Deleted

## 2014-02-17 MED ORDER — TADALAFIL 5 MG PO TABS: 5.0000 mg | ORAL_TABLET | Freq: Every day | ORAL | Status: DC | PRN

## 2014-02-21 ENCOUNTER — Other Ambulatory Visit: Payer: Self-pay | Admitting: *Deleted

## 2014-03-17 ENCOUNTER — Other Ambulatory Visit: Payer: Self-pay | Admitting: *Deleted

## 2014-03-17 MED ORDER — PIOGLITAZONE HCL-METFORMIN HCL 15-850 MG PO TABS: 1.0000 | ORAL_TABLET | Freq: Two times a day (BID) | ORAL | Status: DC

## 2014-03-28 ENCOUNTER — Other Ambulatory Visit (INDEPENDENT_AMBULATORY_CARE_PROVIDER_SITE_OTHER): Payer: 59

## 2014-03-28 DIAGNOSIS — E1165 Type 2 diabetes mellitus with hyperglycemia: Secondary | ICD-10-CM

## 2014-03-28 DIAGNOSIS — IMO0002 Reserved for concepts with insufficient information to code with codable children: Secondary | ICD-10-CM

## 2014-03-28 LAB — LIPID PANEL
CHOL/HDL RATIO: 4
Cholesterol: 161 mg/dL (ref 0–200)
HDL: 45.3 mg/dL (ref >39.00)
LDL CALC: 89 mg/dL (ref 0–99)
NonHDL: 115.7
Triglycerides: 132 mg/dL (ref 0.0–149.0)
VLDL: 26.4 mg/dL (ref 0.0–40.0)

## 2014-03-28 LAB — COMPREHENSIVE METABOLIC PANEL
ALBUMIN: 4 g/dL (ref 3.5–5.2)
ALT: 38 U/L (ref 0–53)
AST: 43 U/L — AB (ref 0–37)
Alkaline Phosphatase: 45 U/L (ref 39–117)
BUN: 11 mg/dL (ref 6–23)
CALCIUM: 9.4 mg/dL (ref 8.4–10.5)
CO2: 31 mEq/L (ref 19–32)
Chloride: 102 mEq/L (ref 96–112)
Creatinine, Ser: 1 mg/dL (ref 0.4–1.5)
GFR: 91.36 mL/min (ref >60.00)
GLUCOSE: 123 mg/dL — AB (ref 70–99)
Potassium: 3.7 mEq/L (ref 3.5–5.1)
Sodium: 140 mEq/L (ref 135–145)
Total Bilirubin: 0.6 mg/dL (ref 0.2–1.2)
Total Protein: 7.6 g/dL (ref 6.0–8.3)

## 2014-03-28 LAB — HEMOGLOBIN A1C: Hgb A1c MFr Bld: 7.1 % — ABNORMAL HIGH (ref 4.6–6.5)

## 2014-04-01 ENCOUNTER — Telehealth: Payer: Self-pay

## 2014-04-01 NOTE — Telephone Encounter (Signed)
Requested call back to discuss flu shot.

## 2014-04-03 ENCOUNTER — Other Ambulatory Visit: Payer: Self-pay | Admitting: *Deleted

## 2014-04-03 ENCOUNTER — Encounter: Payer: Self-pay | Admitting: Endocrinology

## 2014-04-03 ENCOUNTER — Ambulatory Visit (INDEPENDENT_AMBULATORY_CARE_PROVIDER_SITE_OTHER): Payer: 59 | Admitting: Endocrinology

## 2014-04-03 VITALS — BP 170/97 | HR 74 | Temp 98.1°F | Resp 14 | Ht 70.0 in | Wt 211.6 lb

## 2014-04-03 DIAGNOSIS — R413 Other amnesia: Secondary | ICD-10-CM

## 2014-04-03 DIAGNOSIS — IMO0002 Reserved for concepts with insufficient information to code with codable children: Secondary | ICD-10-CM

## 2014-04-03 DIAGNOSIS — R7989 Other specified abnormal findings of blood chemistry: Secondary | ICD-10-CM

## 2014-04-03 DIAGNOSIS — R945 Abnormal results of liver function studies: Secondary | ICD-10-CM

## 2014-04-03 DIAGNOSIS — I10 Essential (primary) hypertension: Secondary | ICD-10-CM

## 2014-04-03 DIAGNOSIS — D352 Benign neoplasm of pituitary gland: Secondary | ICD-10-CM

## 2014-04-03 DIAGNOSIS — E1165 Type 2 diabetes mellitus with hyperglycemia: Secondary | ICD-10-CM

## 2014-04-03 DIAGNOSIS — Z23 Encounter for immunization: Secondary | ICD-10-CM

## 2014-04-03 MED ORDER — AMLODIPINE BESYLATE 10 MG PO TABS: 10.0000 mg | ORAL_TABLET | Freq: Every day | ORAL | Status: DC

## 2014-04-03 NOTE — Progress Notes (Signed)
Patient ID: Joshua Vargas, male   DOB: 1946-02-25, 68 y.o.   MRN: [id number]   Reason for Appointment: follow-up of various issues  History of Present Illness   Diagnosis: Type 2 DIABETES MELITUS, date of diagnosis: 2002      Previous history: He was initially treated with metformin alone and then subsequently was also given Actos and Amaryl.  He has had fairly good control but somewhat inconsistent because of difficulties with compliance with diet and exercise periodically. A1c previously has fluctuated between about 6.7-7.6  Recent history:  In 6/15 he was started on Victoza which he wanted to try because of difficulty losing weight and better control With this he is able to follow his diet more consistently and had lost 5 pounds but this has leveled off now. His Victoza was reduced to 0.9 mg because of marked decreased appetite with 1.2 mg Continues to take Amaryl once in evening However he said he does not feel hungry at all. Eating his meals simply because if feels he has to eat and avoid hypoglycemia. No nausea. Also he has been to stop his Amaryl in the morning. He has brought his monitor for review today and he thinks his blood sugars are fairly good Lab glucose is fairly good at 123 However his A1c slightly higher than before     Oral hypoglycemic drugs: Actoplusmet twice a day and Amaryl 48m in evening         Side effects from medications: None       Monitors blood glucose: ? Frequency    Glucometer:  One Touch.          Blood Glucose readings from his recall : readings before breakfast: 90-129; PC upto 160, not clear how often he is checking blood sugar or how often he is doing post prandial readings  Hypoglycemia frequency: if late for a meal especially lunch       Meals: 3 meals per day.reducing sweets, snacks and meats         Physical activity: exercise:  Walking at times, less recently           Dietician visit: Most recent: [id number]          Complications: are:  Erectile dysfunction   Wt Readings from Last 3 Encounters:  04/03/14 211 lb 9.6 oz (95.981 kg)  11/25/13 211 lb 9.6 oz (95.981 kg)  09/20/13 216 lb 9.6 oz (98.249 kg)   LABS:  Lab Results  Component Value Date   HGBA1C 7.1* 03/28/2014   HGBA1C 6.8* 09/17/2013   HGBA1C 7.0* 05/15/2013   Lab Results  Component Value Date   MICROALBUR 1.7 09/17/2013   LDLCALC 89 03/28/2014   CREATININE 1.0 03/28/2014     Appointment on 03/28/2014  Component Date Value Ref Range Status  . Hgb A1c MFr Bld 03/28/2014 7.1* 4.6 - 6.5 % Final   Glycemic Control Guidelines for People with Diabetes:Non Diabetic:  <6%Goal of Therapy: <7%Additional Action Suggested:  >8%   . Sodium 03/28/2014 140  135 - 145 mEq/L Final  . Potassium 03/28/2014 3.7  3.5 - 5.1 mEq/L Final  . Chloride 03/28/2014 102  96 - 112 mEq/L Final  . CO2 03/28/2014 31  19 - 32 mEq/L Final  . Glucose, Bld 03/28/2014 123* 70 - 99 mg/dL Final  . BUN 03/28/2014 11  6 - 23 mg/dL Final  . Creatinine, Ser 03/28/2014 1.0  0.4 - 1.5 mg/dL Final  . Total Bilirubin 03/28/2014 0.6  0.2 -  1.2 mg/dL Final  . Alkaline Phosphatase 03/28/2014 45  39 - 117 U/L Final  . AST 03/28/2014 43* 0 - 37 U/L Final  . ALT 03/28/2014 38  0 - 53 U/L Final  . Total Protein 03/28/2014 7.6  6.0 - 8.3 g/dL Final  . Albumin 03/28/2014 4.0  3.5 - 5.2 g/dL Final  . Calcium 03/28/2014 9.4  8.4 - 10.5 mg/dL Final  . GFR 03/28/2014 91.36  >60.00 mL/min Final  . Cholesterol 03/28/2014 161  0 - 200 mg/dL Final   ATP III Classification       Desirable:  < 200 mg/dL               Borderline High:  200 - 239 mg/dL          High:  > = 240 mg/dL  . Triglycerides 03/28/2014 132.0  0.0 - 149.0 mg/dL Final   Normal:  <150 mg/dLBorderline High:  150 - 199 mg/dL  . HDL 03/28/2014 45.30  >39.00 mg/dL Final  . VLDL 03/28/2014 26.4  0.0 - 40.0 mg/dL Final  . LDL Cholesterol 03/28/2014 89  0 - 99 mg/dL Final  . Total CHOL/HDL Ratio 03/28/2014 4   Final                  Men           Women1/2 Average Risk     3.4          3.3Average Risk          5.0          4.42X Average Risk          9.6          7.13X Average Risk          15.0          11.0                      . NonHDL 03/28/2014 115.70   Final   NOTE:  Non-HDL goal should be 30 mg/dL higher than patient's LDL goal (i.e. LDL goal of < 70 mg/dL, would have non-HDL goal of < 100 mg/dL)      Medication List       This list is accurate as of: 04/03/14  3:06 PM.  Always use your most recent med list.               acetaminophen 500 MG tablet  Commonly known as:  TYLENOL  Take 500 mg by mouth as needed. pain     amLODipine 10 MG tablet  Commonly known as:  NORVASC  Take 1 tablet (10 mg total) by mouth daily.     aspirin 81 MG EC tablet  Take 81 mg by mouth daily.     Azelastine HCl 0.15 % Soln     cabergoline 0.5 MG tablet  Commonly known as:  DOSTINEX  Take 0.5 tablets (0.25 mg total) by mouth 2 (two) times a week. Take on Saturday and Monday     doxazosin 2 MG tablet  Commonly known as:  CARDURA  TAKE ONE TABLET BY MOUTH AT BEDTIME     glimepiride 4 MG tablet  Commonly known as:  AMARYL  Take two tablets by mouth every day     KLOR-CON M10 10 MEQ tablet  Generic drug:  potassium chloride  Take 10 mEq by mouth daily.     Liraglutide 18 MG/3ML Sopn  Inject 1.2 mg  daily     montelukast 10 MG tablet  Commonly known as:  SINGULAIR     pioglitazone-metformin 15-850 MG per tablet  Commonly known as:  ACTOPLUS MET  Take 1 tablet by mouth 2 (two) times daily with a meal.     sildenafil 50 MG tablet  Commonly known as:  VIAGRA  Take 1 tablet (50 mg total) by mouth as needed for erectile dysfunction.     simvastatin 40 MG tablet  Commonly known as:  ZOCOR     tadalafil 5 MG tablet  Commonly known as:  CIALIS  Take 1 tablet (5 mg total) by mouth daily as needed for erectile dysfunction.     valsartan-hydrochlorothiazide 320-12.5 MG per tablet  Commonly known as:  DIOVAN-HCT  TAKE ONE  TABLET BY MOUTH ONCE DAILY     Vitamin D-3 5000 UNITS Tabs  Take 1 capsule by mouth at bedtime.        Allergies:  Allergies  Allergen Reactions  . Atorvastatin Itching  . Dextran Rash    Other Reaction: Not Assessed    Past Medical History  Diagnosis Date  . Hypertension   . Diabetes mellitus   . Hyperlipemia     Past Surgical History  Procedure Laterality Date  . Joint replacement      No family history on file.  Social History:  reports that he has never smoked. He does not have any smokeless tobacco history on file. He reports that he does not drink alcohol or use illicit drugs.  Review of Systems:    He has several complaints today:    asking about feeling tired.  This is mostly in the evenings when he comes back from work.  Has no fatigue or decrease energy level  During the day  and has been sleeping fairly well.    He has some difficulty with short-term memory with forgetting for a short period of time what he was planning to do.  Not interested in medications for this.  Hypertension:  BP at home not checked.  Again he is somewhat inconsistent in taking his blood pressure medications. Last month he apparently had been out of his Diovan HCT which was restarted.  Earlier this month he did not get his Cardura filled because of confusion at the pharmacy.  Now he apparently has not been taking amlodipine for some unknown period of time No hypokalemia present  Lipids: LDL is still well controlled, had been on Crestor and simvastatin and these were stopped because of increased liver functions  Lab Results  Component Value Date   CHOL 161 03/28/2014   HDL 45.30 03/28/2014   LDLCALC 89 03/28/2014   TRIG 132.0 03/28/2014   CHOLHDL 4 03/28/2014      He tends to have mild increase in liver functions, possibly from fatty liver although his ultrasound was normal in 2011. Evaluation from Dr. Collene Mares not available in the record He has not had any alcohol intake,  nonsteroidal anti-inflammatory drugs Liver functions are nearly normal with stopping his simvastatin   He has had a prolactinoma since 2007 with baseline prolactin level of 101. He has had better libido with consistently treating this with Dostinex. Last prolactin level was 15.3 This will need to be reevaluated Also testosterone level previously has been normal  No results found for: TESTOSTERONE  No recent swelling of the feet   Examination:   BP 170/97 mmHg  Pulse 74  Temp(Src) 98.1 F (36.7 C)  Resp 14  Ht  5' 10" (1.778 m)  Wt 211 lb 9.6 oz (95.981 kg)  BMI 30.36 kg/m2  SpO2 97%  Body mass index is 30.36 kg/(m^2).   Exam not indicated   ASSESSMENT/ PLAN:   Diabetes type 2   Blood glucose control is still fairly good and has benefited from even a lower dose of Victoza. Has stabilized his weight better and is taking less Amaryl. However again not clear if he is having some postprandial hyperglycemia and he has not brought his monitor for review Recently has not been exercising and reminded him to do this consistently He will continue the same regimen for now Discussed timing of glucose monitoring and targets  Hypertension: blood pressure is poorly controlled and this may be related to his not taking amlodipine for uncertain duration of time He does not appear to be getting all his medications refilled consistently Will need to recheck his blood pressure in 6 weeks after restarting amlodipine and will continue the other medications  Hypokalemia: No hypokalemia with Diovan HCT at this time  Abnormal liver functions: Likely  from  statin drug, See discussion above.  Liver functions are nearly normal with stopping simvastatin Although he may have some fatty liver his liver functions were still relatively high when his A1c was 6.8  Hyperlipidemia: Since LDL is still below 100 will hold off on any statin drugs  Fatigue and memory loss: Will check B-12, testosterone and  thyroid levels on the next visit. Also consider sleep study   Patient Instructions  Restart Amlodipine  Restart exercise   Total visit time including evaluation of multiple problems and counseling = 25 minutes  KUMAR,AJAY 04/03/2014, 3:06 PM

## 2014-04-03 NOTE — Patient Instructions (Signed)
Restart Amlodipine  Restart exercise

## 2014-05-15 ENCOUNTER — Other Ambulatory Visit (INDEPENDENT_AMBULATORY_CARE_PROVIDER_SITE_OTHER): Payer: 59

## 2014-05-15 DIAGNOSIS — R413 Other amnesia: Secondary | ICD-10-CM

## 2014-05-15 DIAGNOSIS — E1165 Type 2 diabetes mellitus with hyperglycemia: Secondary | ICD-10-CM

## 2014-05-15 DIAGNOSIS — D352 Benign neoplasm of pituitary gland: Secondary | ICD-10-CM

## 2014-05-15 DIAGNOSIS — IMO0002 Reserved for concepts with insufficient information to code with codable children: Secondary | ICD-10-CM

## 2014-05-15 LAB — LIPID PANEL
CHOLESTEROL: 184 mg/dL (ref 0–200)
HDL: 65.9 mg/dL (ref >39.00)
LDL Cholesterol: 91 mg/dL (ref 0–99)
NonHDL: 118.1
Total CHOL/HDL Ratio: 3
Triglycerides: 137 mg/dL (ref 0.0–149.0)
VLDL: 27.4 mg/dL (ref 0.0–40.0)

## 2014-05-15 LAB — URINALYSIS, ROUTINE W REFLEX MICROSCOPIC
BILIRUBIN URINE: NEGATIVE
HGB URINE DIPSTICK: NEGATIVE
Ketones, ur: NEGATIVE
Leukocytes, UA: NEGATIVE
Nitrite: NEGATIVE
RBC / HPF: NONE SEEN (ref 0–?)
Specific Gravity, Urine: 1.015 (ref 1.000–1.030)
Total Protein, Urine: NEGATIVE
Urine Glucose: NEGATIVE
Urobilinogen, UA: 0.2 (ref 0.0–1.0)
pH: 6.5 (ref 5.0–8.0)

## 2014-05-15 LAB — VITAMIN B12: Vitamin B-12: 362 pg/mL (ref 211–911)

## 2014-05-15 LAB — COMPREHENSIVE METABOLIC PANEL
ALBUMIN: 4.2 g/dL (ref 3.5–5.2)
ALK PHOS: 50 U/L (ref 39–117)
ALT: 41 U/L (ref 0–53)
AST: 34 U/L (ref 0–37)
BILIRUBIN TOTAL: 0.5 mg/dL (ref 0.2–1.2)
BUN: 16 mg/dL (ref 6–23)
CO2: 26 mEq/L (ref 19–32)
Calcium: 10.1 mg/dL (ref 8.4–10.5)
Chloride: 102 mEq/L (ref 96–112)
Creatinine, Ser: 1.18 mg/dL (ref 0.40–1.50)
GFR: 78.94 mL/min (ref >60.00)
GLUCOSE: 105 mg/dL — AB (ref 70–99)
POTASSIUM: 4.2 meq/L (ref 3.5–5.1)
SODIUM: 141 meq/L (ref 135–145)
Total Protein: 7.7 g/dL (ref 6.0–8.3)

## 2014-05-15 LAB — MICROALBUMIN / CREATININE URINE RATIO
Creatinine,U: 165.9 mg/dL
Microalb Creat Ratio: 0.7 mg/g (ref 0.0–30.0)
Microalb, Ur: 1.2 mg/dL (ref 0.0–1.9)

## 2014-05-15 LAB — T4, FREE: Free T4: 0.66 ng/dL (ref 0.60–1.60)

## 2014-05-15 LAB — TSH: TSH: 2.33 u[IU]/mL (ref 0.35–4.50)

## 2014-05-15 LAB — HEMOGLOBIN A1C: Hgb A1c MFr Bld: 7.6 % — ABNORMAL HIGH (ref 4.6–6.5)

## 2014-05-15 LAB — TESTOSTERONE: Testosterone: 435.42 ng/dL (ref 300.00–890.00)

## 2014-05-16 LAB — PROLACTIN: Prolactin: 6.7 ng/mL (ref 2.1–17.1)

## 2014-05-21 ENCOUNTER — Encounter: Payer: Self-pay | Admitting: Endocrinology

## 2014-05-21 ENCOUNTER — Ambulatory Visit (INDEPENDENT_AMBULATORY_CARE_PROVIDER_SITE_OTHER): Payer: 59 | Admitting: Endocrinology

## 2014-05-21 VITALS — BP 123/76 | HR 77 | Temp 98.2°F | Resp 14 | Ht 70.0 in | Wt 212.2 lb

## 2014-05-21 DIAGNOSIS — I10 Essential (primary) hypertension: Secondary | ICD-10-CM

## 2014-05-21 DIAGNOSIS — N529 Male erectile dysfunction, unspecified: Secondary | ICD-10-CM

## 2014-05-21 DIAGNOSIS — IMO0002 Reserved for concepts with insufficient information to code with codable children: Secondary | ICD-10-CM

## 2014-05-21 DIAGNOSIS — Z23 Encounter for immunization: Secondary | ICD-10-CM

## 2014-05-21 DIAGNOSIS — E1165 Type 2 diabetes mellitus with hyperglycemia: Secondary | ICD-10-CM

## 2014-05-21 DIAGNOSIS — D352 Benign neoplasm of pituitary gland: Secondary | ICD-10-CM

## 2014-05-21 DIAGNOSIS — R945 Abnormal results of liver function studies: Secondary | ICD-10-CM

## 2014-05-21 DIAGNOSIS — Z0000 Encounter for general adult medical examination without abnormal findings: Secondary | ICD-10-CM

## 2014-05-21 DIAGNOSIS — R399 Unspecified symptoms and signs involving the genitourinary system: Secondary | ICD-10-CM

## 2014-05-21 DIAGNOSIS — R7989 Other specified abnormal findings of blood chemistry: Secondary | ICD-10-CM

## 2014-05-21 NOTE — Progress Notes (Signed)
Patient ID: Joshua Vargas, male   DOB: 13-Apr-1945, 69 y.o.   MRN: [id number]   Reason for Appointment: Complete physical exam and follow-up of various issues  History of Present Illness   Type 2 DIABETES MELITUS, date of diagnosis: 2002      Previous history: He was initially treated with metformin alone and then subsequently was also given Actos and Amaryl.  He has had fairly good control but somewhat inconsistent because of difficulties with compliance with diet and exercise periodically. A1c previously has fluctuated between about 6.7-7.6  Recent history:  In 6/15 he was started on Victoza which he wanted to try because of difficulty losing weight and better control With this he is able to follow his diet more consistently and had lost 5 pounds but this has leveled off now. His Victoza was reduced to 0.9 mg because of marked decreased appetite with 1.2 mg Continues to take Amaryl 4 mg in evening For some reason he is taking his Actoplusmet only once a day instead of twice a He thinks he has been inconsistent with his diet and not able to lose weight Also has not exercised despite reminders, does a little walking at work He did finally bring his blood sugar monitor for his home testing review and he has excellent readings in the mornings with lowest reading 68 without any symptoms Has only sporadic readings later in the day which are fairly good However his A1c is still high at 7.6     Oral hypoglycemic drugs: Actoplusmet once a day and Amaryl 71m in evening          Side effects from medications:  anorexia with 1.2 mg Victoza       Monitors blood glucose:   0.4 times a day   Glucometer:  One Touch.          Blood Glucose readings  Fasting = 68-167, median 104 and nonfasting 95-144 with only 3 readings  Hypoglycemia frequency:  none recently     Meals: 3 meals per day.reducing sweets, snacks and meats         Physical activity: exercise:  Walking some at work           Dietician  visit: Most recent: [id number]          Complications: are: Erectile dysfunction   Wt Readings from Last 3 Encounters:  05/21/14 212 lb 3.2 oz (96.253 kg)  04/03/14 211 lb 9.6 oz (95.981 kg)  11/25/13 211 lb 9.6 oz (95.981 kg)   LABS:  Lab Results  Component Value Date   HGBA1C 7.6* 05/15/2014   HGBA1C 7.1* 03/28/2014   HGBA1C 6.8* 09/17/2013   Lab Results  Component Value Date   MICROALBUR 1.2 05/15/2014   LDLCALC 91 05/15/2014   CREATININE 1.18 05/15/2014     PREVENTIVE CARE:  Diet: Generally controlling portions, variable compliance with low fat and low carbohydrate meals Exercise: None History of falls: Has had a couple of falls from going down the steps about 2 months ago  Annual hemoccults:           ?  2012   prostate exams:                             2012   Colonoscopy/sigmoidoscopy  06/2009  PSA  ok in 2011       Flu vaccine:   annual  Tetanus booster:    Zostavax: 5/10 Pneumovax:  2004    LABS:  Appointment on 05/15/2014  Component Date Value Ref Range Status  . Hgb A1c MFr Bld 05/15/2014 7.6* 4.6 - 6.5 % Final   Glycemic Control Guidelines for People with Diabetes:Non Diabetic:  <6%Goal of Therapy: <7%Additional Action Suggested:  >8%   . Sodium 05/15/2014 141  135 - 145 mEq/L Final  . Potassium 05/15/2014 4.2  3.5 - 5.1 mEq/L Final  . Chloride 05/15/2014 102  96 - 112 mEq/L Final  . CO2 05/15/2014 26  19 - 32 mEq/L Final  . Glucose, Bld 05/15/2014 105* 70 - 99 mg/dL Final  . BUN 05/15/2014 16  6 - 23 mg/dL Final  . Creatinine, Ser 05/15/2014 1.18  0.40 - 1.50 mg/dL Final  . Total Bilirubin 05/15/2014 0.5  0.2 - 1.2 mg/dL Final  . Alkaline Phosphatase 05/15/2014 50  39 - 117 U/L Final  . AST 05/15/2014 34  0 - 37 U/L Final  . ALT 05/15/2014 41  0 - 53 U/L Final  . Total Protein 05/15/2014 7.7  6.0 - 8.3 g/dL Final  . Albumin 05/15/2014 4.2  3.5 - 5.2 g/dL Final  . Calcium 05/15/2014 10.1  8.4 - 10.5 mg/dL Final  . GFR 05/15/2014 78.94  >60.00 mL/min Final   . Cholesterol 05/15/2014 184  0 - 200 mg/dL Final   ATP III Classification       Desirable:  < 200 mg/dL               Borderline High:  200 - 239 mg/dL          High:  > = 240 mg/dL  . Triglycerides 05/15/2014 137.0  0.0 - 149.0 mg/dL Final   Normal:  <150 mg/dLBorderline High:  150 - 199 mg/dL  . HDL 05/15/2014 65.90  >39.00 mg/dL Final  . VLDL 05/15/2014 27.4  0.0 - 40.0 mg/dL Final  . LDL Cholesterol 05/15/2014 91  0 - 99 mg/dL Final  . Total CHOL/HDL Ratio 05/15/2014 3   Final                  Men          Women1/2 Average Risk     3.4          3.3Average Risk          5.0          4.42X Average Risk          9.6          7.13X Average Risk          15.0          11.0                      . NonHDL 05/15/2014 118.10   Final   NOTE:  Non-HDL goal should be 30 mg/dL higher than patient's LDL goal (i.e. LDL goal of < 70 mg/dL, would have non-HDL goal of < 100 mg/dL)  . Color, Urine 05/15/2014 YELLOW  Yellow;Lt. Yellow Final  . APPearance 05/15/2014 CLEAR  Clear Final  . Specific Gravity, Urine 05/15/2014 1.015  1.000-1.030 Final  . pH 05/15/2014 6.5  5.0 - 8.0 Final  . Total Protein, Urine 05/15/2014 NEGATIVE  Negative Final  . Urine Glucose 05/15/2014 NEGATIVE  Negative Final  . Ketones, ur 05/15/2014 NEGATIVE  Negative Final  . Bilirubin Urine 05/15/2014 NEGATIVE  Negative Final  . Hgb urine dipstick 05/15/2014 NEGATIVE  Negative Final  .  Urobilinogen, UA 05/15/2014 0.2  0.0 - 1.0 Final  . Leukocytes, UA 05/15/2014 NEGATIVE  Negative Final  . Nitrite 05/15/2014 NEGATIVE  Negative Final  . WBC, UA 05/15/2014 0-2/hpf  0-2/hpf Final  . RBC / HPF 05/15/2014 none seen  0-2/hpf Final  . Squamous Epithelial / LPF 05/15/2014 Rare(0-4/hpf)  Rare(0-4/hpf) Final  . Microalb, Ur 05/15/2014 1.2  0.0 - 1.9 mg/dL Final  . Creatinine,U 05/15/2014 165.9   Final  . Microalb Creat Ratio 05/15/2014 0.7  0.0 - 30.0 mg/g Final  . TSH 05/15/2014 2.33  0.35 - 4.50 uIU/mL Final  . Free T4 05/15/2014 0.66   0.60 - 1.60 ng/dL Final  . Prolactin 05/15/2014 6.7  2.1 - 17.1 ng/mL Final   Comment:      Reference Ranges:                  Male:                       2.1 -  17.1 ng/ml                  Male:   Pregnant          9.7 - 208.5 ng/mL                            Non Pregnant      2.8 -  29.2 ng/mL                            Post Menopausal   1.8 -  20.3 ng/mL                      . Testosterone 05/15/2014 435.42  300.00 - 890.00 ng/dL Final  . Vitamin B-12 05/15/2014 362  211 - 911 pg/mL Final      Medication List       This list is accurate as of: 05/21/14 11:59 PM.  Always use your most recent med list.               acetaminophen 500 MG tablet  Commonly known as:  TYLENOL  Take 500 mg by mouth as needed. pain     amLODipine 10 MG tablet  Commonly known as:  NORVASC  Take 1 tablet (10 mg total) by mouth daily.     aspirin 81 MG EC tablet  Take 81 mg by mouth daily.     Azelastine HCl 0.15 % Soln     cabergoline 0.5 MG tablet  Commonly known as:  DOSTINEX  Take 0.5 tablets (0.25 mg total) by mouth 2 (two) times a week. Take on Saturday and Monday     doxazosin 2 MG tablet  Commonly known as:  CARDURA  TAKE ONE TABLET BY MOUTH AT BEDTIME     glimepiride 4 MG tablet  Commonly known as:  AMARYL  Take two tablets by mouth every day     KLOR-CON M10 10 MEQ tablet  Generic drug:  potassium chloride  Take 10 mEq by mouth daily.     Liraglutide 18 MG/3ML Sopn  Inject 1.2 mg daily     montelukast 10 MG tablet  Commonly known as:  SINGULAIR     pioglitazone-metformin 15-850 MG per tablet  Commonly known as:  ACTOPLUS MET  Take 1 tablet by mouth 2 (two) times daily with a meal.  sildenafil 50 MG tablet  Commonly known as:  VIAGRA  Take 1 tablet (50 mg total) by mouth as needed for erectile dysfunction.     simvastatin 40 MG tablet  Commonly known as:  ZOCOR     tadalafil 5 MG tablet  Commonly known as:  CIALIS  Take 1 tablet (5 mg total) by mouth daily  as needed for erectile dysfunction.     valsartan-hydrochlorothiazide 320-12.5 MG per tablet  Commonly known as:  DIOVAN-HCT  TAKE ONE TABLET BY MOUTH ONCE DAILY     Vitamin D-3 5000 UNITS Tabs  Take 1 capsule by mouth at bedtime.        Allergies:  Allergies  Allergen Reactions  . Atorvastatin Itching  . Dextran Rash    Other Reaction: Not Assessed    Past Medical History  Diagnosis Date  . Hypertension   . Diabetes mellitus   . Hyperlipemia     Past Surgical History  Procedure Laterality Date  . Joint replacement      Family History  Problem Relation Age of Onset  . Cancer Father   . Diabetes Brother     Social History:  reports that he has never smoked. He does not have any smokeless tobacco history on file. He reports that he does not drink alcohol or use illicit drugs.  Review of Systems:  He has some difficulty with short-term memory with forgetting for a short period of time what he is planning to do.   However has no difficulty with memory at any other time or with managing his work or finances. His thyroid and B-12 are checked and are normal  Hypertension:  BP at home checked recently reading about 120s/70s.  Last time he had high blood pressure reading from not getting his medications filled consistently but he is doing better with this now No hypokalemia present  Lipids: LDL is still well controlled, had been on Crestor and simvastatin and these were stopped because of increased liver functions  Lab Results  Component Value Date   CHOL 184 05/15/2014   HDL 65.90 05/15/2014   LDLCALC 91 05/15/2014   TRIG 137.0 05/15/2014   CHOLHDL 3 05/15/2014      He has had mild increase in liver functions, possibly from fatty liver although his ultrasound was normal in 2011.  Evaluation from Dr. Collene Mares not available in the record He has not had any alcohol intake, nonsteroidal anti-inflammatory drugs Liver functions are consistently normal with stopping his  simvastatin now   He has had a 4x 5-mm  prolactinoma since 2007 with baseline prolactin level of 101, treated with Dostinex consistently.  He has had better libido with using Dostinex. Last prolactin level was normal at 6.7 and his testosterone level has been normal also  Lab Results  Component Value Date   TESTOSTERONE 435.42 05/15/2014       No unusual headaches.     ENT: Previously had allergic rhinitis, treated with steroids and has seen an allergist.     Skin: No rash or infections.       Thyroid:  No unusual fatigue, just feels tired at the end of the day.     No swelling of feet.       No shortness of breath on exertion.     No chest tightness or pain on physical activity     Bowel habits: No change.  Has chronic constipation.     No abdominal pain or nausea.    He  has change in urinary stream which is slightly slower;  has nocturia up to 1-3 times, sometimes will have urgency also     He has had chronic erectile dysfunction treated symptomatically with Cialis      No joint pains.  Previously had pain in his neck running down the left arm.      Sleep: He is usually doing well but occasionally may have difficulty sleeping. Does not have any daytime somnolence.  Has history of snoring.   No complaints of anxiety or depression    Examination:   BP 123/76 mmHg  Pulse 77  Temp(Src) 98.2 F (36.8 C)  Resp 14  Ht 5' 10"  (1.778 m)  Wt 212 lb 3.2 oz (96.253 kg)  BMI 30.45 kg/m2  SpO2 97%  Body mass index is 30.45 kg/(m^2).   GENERAL: Mild generalized obesity present  No pallor, clubbing    Skin:  no rash or pigmentation.  EYES:  Externally normal.  Fundii:  normal discs and vessels, no retinopathy.  ENT: Oropharynx and tongue normal, mucous membranes normal  THYROID:  Not palpable.  CAROTIDS:  Normal character; no bruit.  HEART:  Normal apex, S1 and S2; no murmur or click.  CHEST:  Normal shape.  Lungs:  Vescicular breath sounds heard equally.  No  crepitations/ wheeze.  ABDOMEN:  No distention.  Liver and spleen not palpable.  No other mass or tenderness. No abnormal epigastric pulsation   RECTAL exam:  No mass palpable.  Prostate minimally enlarged and soft, smooth  NEUROLOGICAL:    Vibration sense at toes is mildly decreased.  Reflexes are absent bilaterally at ankles and biceps. Diabetic foot exam shows normal monofilament sensation in the toes and plantar surfaces, no skin lesions or ulcers on the feet and normal pedal pulses  Extremities: No edema present.  SPINE AND JOINTS:  No swelling or deformity of the peripheral joints and knees   ASSESSMENT/ PLAN:   Diabetes type 2   Blood glucose control is not as well controlled with higher A1c Not clear why his A1c is higher than expected as home blood sugars are excellent He does not check readings after meals and may be having postprandial hyperglycemia Also may benefit from taking Actoplusmet twice a day for insulin resistance Since his blood sugars may be low normal fasting he can reduce his Amaryl to half tablet Discussed timing and frequency of glucose monitoring and blood sugar targets May do better with inconsistent diet if monitoring postprandial readings also Also emphasized the need for regular exercise which he has not been motivated to do No change in Victoza as yet which he is tolerating, taking less than 1.2 mg  Hypertension: blood pressure is much better controlled with better compliance using medications He will also continue to monitor at home  Lower urinary tract symptoms: No significant prostate enlargement but symptoms are suggestive of prostatism and he may benefit from a higher dose of doxazosin.  He can try increasing this and at the same time reduce amlodipine for now as a trial.  Abnormal liver functions: Likely from statin drug but have been only mildly abnormal.   Liver functions are now consistently normal with stopping simvastatin Although he may  have some fatty liver his liver functions were still relatively high when his A1c was 6.8  Hyperlipidemia: Since LDL is still below 100 off medications will hold off on any statin drugs  Mild memory loss: This is minor and situational and he does not want to consider  any further management.  No reversible causes present.  History of falls: He thinks he only overbalanced going down steps and does not have any gait disturbance or balance issues  Preventive care: He needs Hemoccults and Prevnar today Will need to verify his last tetanus toxoid Colonoscopy be done later this year as recommended by gastroenterologist Discussed continuing annual eye exams, foot exams, dentist visits Needs annual flu vaccine   Patient Instructions  Glimeperide before supper, 1/2 tab only Actoplusmet twice dailywith food  Exercise 30 min, 3-4/ week  Doxasozin 2 of the 2 mg and Amlodipine 1/2 of 77m daily as trial for prostatism, call if this is effective  Please check blood sugars at least half the time about 2 hours after any meal and 2 times per week on waking up. Please bring blood sugar monitor to each visit. Recommended blood sugar levels about 2 hours after meal is 140-180 and on waking up 90-130     Total visit time including evaluation of multiple problems and counseling = 25 minutes  Ladelle Teodoro 05/22/2014, 11:38 AM

## 2014-05-21 NOTE — Patient Instructions (Addendum)
Glimeperide before supper, 1/2 tab only Actoplusmet twice dailywith food  Exercise 30 min, 3-4/ week  Doxasozin 2 of the 2 mg and Amlodipine 1/2 of 10mg  daily as trial for prostatism, call if this is effective  Please check blood sugars at least half the time about 2 hours after any meal and 2 times per week on waking up. Please bring blood sugar monitor to each visit. Recommended blood sugar levels about 2 hours after meal is 140-180 and on waking up 90-130

## 2014-07-28 ENCOUNTER — Other Ambulatory Visit: Payer: Self-pay | Admitting: Endocrinology

## 2014-08-04 ENCOUNTER — Other Ambulatory Visit: Payer: Self-pay | Admitting: *Deleted

## 2014-08-04 MED ORDER — GLUCOSE BLOOD VI STRP: ORAL_STRIP | Status: DC

## 2014-08-08 ENCOUNTER — Other Ambulatory Visit: Payer: Self-pay | Admitting: Internal Medicine

## 2014-08-08 DIAGNOSIS — Z800 Family history of malignant neoplasm of digestive organs: Secondary | ICD-10-CM

## 2014-08-11 ENCOUNTER — Other Ambulatory Visit: Payer: 59

## 2014-08-12 ENCOUNTER — Ambulatory Visit
Admission: RE | Admit: 2014-08-12 | Discharge: 2014-08-12 | Disposition: A | Discharge status: Home / Self Care | Payer: 59 | Source: Ambulatory Visit | Attending: Internal Medicine | Admitting: Internal Medicine

## 2014-08-12 DIAGNOSIS — Z800 Family history of malignant neoplasm of digestive organs: Secondary | ICD-10-CM

## 2014-08-15 ENCOUNTER — Other Ambulatory Visit: Payer: 59

## 2014-08-18 ENCOUNTER — Other Ambulatory Visit: Payer: Self-pay | Admitting: Internal Medicine

## 2014-08-18 DIAGNOSIS — R109 Unspecified abdominal pain: Secondary | ICD-10-CM

## 2014-08-18 DIAGNOSIS — Z800 Family history of malignant neoplasm of digestive organs: Secondary | ICD-10-CM

## 2014-08-18 DIAGNOSIS — R140 Abdominal distension (gaseous): Secondary | ICD-10-CM

## 2014-08-20 ENCOUNTER — Other Ambulatory Visit: Payer: 59

## 2014-08-20 ENCOUNTER — Ambulatory Visit: Payer: 59 | Admitting: Endocrinology

## 2014-08-21 ENCOUNTER — Ambulatory Visit
Admission: RE | Admit: 2014-08-21 | Discharge: 2014-08-21 | Disposition: A | Discharge status: Home / Self Care | Payer: 59 | Source: Ambulatory Visit | Attending: Internal Medicine | Admitting: Internal Medicine

## 2014-08-21 DIAGNOSIS — Z800 Family history of malignant neoplasm of digestive organs: Secondary | ICD-10-CM

## 2014-08-21 DIAGNOSIS — R140 Abdominal distension (gaseous): Secondary | ICD-10-CM

## 2014-08-21 DIAGNOSIS — R109 Unspecified abdominal pain: Secondary | ICD-10-CM

## 2014-08-21 MED ORDER — IOHEXOL 300 MG/ML  SOLN
125.0000 mL | Freq: Once | INTRAMUSCULAR | Status: AC | PRN
  Administered 2014-08-21: 125 mL via INTRAVENOUS

## 2014-09-03 ENCOUNTER — Other Ambulatory Visit: Payer: Self-pay | Admitting: Endocrinology

## 2014-09-09 ENCOUNTER — Other Ambulatory Visit: Payer: Self-pay | Admitting: Endocrinology

## 2014-09-12 ENCOUNTER — Other Ambulatory Visit (INDEPENDENT_AMBULATORY_CARE_PROVIDER_SITE_OTHER): Payer: 59

## 2014-09-12 DIAGNOSIS — IMO0002 Reserved for concepts with insufficient information to code with codable children: Secondary | ICD-10-CM

## 2014-09-12 DIAGNOSIS — E1165 Type 2 diabetes mellitus with hyperglycemia: Secondary | ICD-10-CM

## 2014-09-12 LAB — COMPREHENSIVE METABOLIC PANEL
ALBUMIN: 4 g/dL (ref 3.5–5.2)
ALT: 37 U/L (ref 0–53)
AST: 37 U/L (ref 0–37)
Alkaline Phosphatase: 48 U/L (ref 39–117)
BILIRUBIN TOTAL: 0.4 mg/dL (ref 0.2–1.2)
BUN: 14 mg/dL (ref 6–23)
CO2: 30 mEq/L (ref 19–32)
Calcium: 9.6 mg/dL (ref 8.4–10.5)
Chloride: 98 mEq/L (ref 96–112)
Creatinine, Ser: 1.05 mg/dL (ref 0.40–1.50)
GFR: 90.23 mL/min (ref >60.00)
Glucose, Bld: 158 mg/dL — ABNORMAL HIGH (ref 70–99)
POTASSIUM: 3.4 meq/L — AB (ref 3.5–5.1)
Sodium: 136 mEq/L (ref 135–145)
Total Protein: 7.3 g/dL (ref 6.0–8.3)

## 2014-09-12 LAB — HEMOGLOBIN A1C: HEMOGLOBIN A1C: 7.1 % — AB (ref 4.6–6.5)

## 2014-09-17 ENCOUNTER — Encounter: Payer: Self-pay | Admitting: Endocrinology

## 2014-09-17 ENCOUNTER — Other Ambulatory Visit: Payer: Self-pay | Admitting: *Deleted

## 2014-09-17 ENCOUNTER — Ambulatory Visit (INDEPENDENT_AMBULATORY_CARE_PROVIDER_SITE_OTHER): Payer: 59 | Admitting: Endocrinology

## 2014-09-17 VITALS — BP 140/76 | HR 83 | Temp 98.2°F | Resp 14 | Ht 70.0 in | Wt 218.0 lb

## 2014-09-17 DIAGNOSIS — R7989 Other specified abnormal findings of blood chemistry: Secondary | ICD-10-CM

## 2014-09-17 DIAGNOSIS — R945 Abnormal results of liver function studies: Secondary | ICD-10-CM

## 2014-09-17 DIAGNOSIS — E1165 Type 2 diabetes mellitus with hyperglycemia: Secondary | ICD-10-CM

## 2014-09-17 DIAGNOSIS — D352 Benign neoplasm of pituitary gland: Secondary | ICD-10-CM

## 2014-09-17 DIAGNOSIS — IMO0002 Reserved for concepts with insufficient information to code with codable children: Secondary | ICD-10-CM

## 2014-09-17 MED ORDER — POTASSIUM CHLORIDE CRYS ER 10 MEQ PO TBCR: 10.0000 meq | EXTENDED_RELEASE_TABLET | Freq: Every day | ORAL | Status: DC

## 2014-09-17 MED ORDER — LIRAGLUTIDE 18 MG/3ML ~~LOC~~ SOPN: PEN_INJECTOR | SUBCUTANEOUS | Status: DC

## 2014-09-17 NOTE — Patient Instructions (Addendum)
Check blood sugars on waking up ..3  .. times a week Also check blood sugars about 2 hours after a meal and do this after different meals by rotation  Recommended blood sugar levels on waking up is 90-130 and about 2 hours after meal is 140-180 Please bring blood sugar monitor to each visit.  Start walking program  Victoza 0.6mg   Potassium 1/day

## 2014-09-17 NOTE — Progress Notes (Signed)
Patient ID: Joshua Vargas, male   DOB: Jun 10, 1945, 69 y.o.   MRN: [id number]   Reason for Appointment: follow-up of various issues  History of Present Illness   Type 2 DIABETES MELITUS, date of diagnosis: 2002      Previous history: He was initially treated with metformin alone and then subsequently was also given Actos and Amaryl.  He has had fairly good control but somewhat inconsistent because of difficulties with compliance with diet and exercise periodically. A1c previously has fluctuated between about 6.7-7.6  Recent history:  In 6/15 he was started on Victoza which he wanted to try because of difficulty losing weight and better control With this he is able to follow his diet more consistently and had lost 5 pounds  However he has stopped taking this since his last visit because of fear of pancreatitis and pancreatic cancer because of family history.  He thinks he was having some abdominal discomfort but previously had tolerated it very well; he was taking 0.9 mg With stopping this his weight has gone up significantly Also he was told to take Actoplusmet twice a day again since his A1c had gone up to 7.6 His A1c is better at 7.1 However does not bring his monitor usually to his office visit and not clear how often he is monitoring Continues to take Amaryl 4 mg in evening He thinks he has been watching his portions Exercise: None     Oral hypoglycemic drugs: Actoplusmet twice a day and Amaryl 63m in evening          Side effects from medications:  anorexia with 1.2 mg Victoza       Monitors blood glucose: Sporadically   Glucometer:  One Touch.          Blood Glucose readings <120 am PC 130  Hypoglycemia frequency:  none      Meals: 3 meals per day.reducing sweets, snacks and meats         Physical activity: exercise:  Walking some at work           Dietician visit: Most recent: [id number]          Complications: are: Erectile dysfunction   Wt Readings from Last 3 Encounters:   09/17/14 218 lb (98.884 kg)  05/21/14 212 lb 3.2 oz (96.253 kg)  04/03/14 211 lb 9.6 oz (95.981 kg)   LABS:  Lab Results  Component Value Date   HGBA1C 7.1* 09/12/2014   HGBA1C 7.6* 05/15/2014   HGBA1C 7.1* 03/28/2014   Lab Results  Component Value Date   MICROALBUR 1.2 05/15/2014   LDLCALC 91 05/15/2014   CREATININE 1.05 09/12/2014        LABS:  Lab on 09/12/2014  Component Date Value Ref Range Status  . Hgb A1c MFr Bld 09/12/2014 7.1* 4.6 - 6.5 % Final   Glycemic Control Guidelines for People with Diabetes:Non Diabetic:  <6%Goal of Therapy: <7%Additional Action Suggested:  >8%   . Sodium 09/12/2014 136  135 - 145 mEq/L Final  . Potassium 09/12/2014 3.4* 3.5 - 5.1 mEq/L Final  . Chloride 09/12/2014 98  96 - 112 mEq/L Final  . CO2 09/12/2014 30  19 - 32 mEq/L Final  . Glucose, Bld 09/12/2014 158* 70 - 99 mg/dL Final  . BUN 09/12/2014 14  6 - 23 mg/dL Final  . Creatinine, Ser 09/12/2014 1.05  0.40 - 1.50 mg/dL Final  . Total Bilirubin 09/12/2014 0.4  0.2 - 1.2 mg/dL Final  . Alkaline Phosphatase  09/12/2014 48  39 - 117 U/L Final  . AST 09/12/2014 37  0 - 37 U/L Final  . ALT 09/12/2014 37  0 - 53 U/L Final  . Total Protein 09/12/2014 7.3  6.0 - 8.3 g/dL Final  . Albumin 09/12/2014 4.0  3.5 - 5.2 g/dL Final  . Calcium 09/12/2014 9.6  8.4 - 10.5 mg/dL Final  . GFR 09/12/2014 90.23  >60.00 mL/min Final      Medication List       This list is accurate as of: 09/17/14 11:59 PM.  Always use your most recent med list.               acetaminophen 500 MG tablet  Commonly known as:  TYLENOL  Take 500 mg by mouth as needed. pain     amLODipine 10 MG tablet  Commonly known as:  NORVASC  Take 1 tablet (10 mg total) by mouth daily.     aspirin 81 MG EC tablet  Take 81 mg by mouth daily.     Azelastine HCl 0.15 % Soln     cabergoline 0.5 MG tablet  Commonly known as:  DOSTINEX  Take 0.5 tablets (0.25 mg total) by mouth 2 (two) times a week. Take on Saturday and  Monday     doxazosin 2 MG tablet  Commonly known as:  CARDURA  TAKE ONE TABLET BY MOUTH AT BEDTIME     glimepiride 4 MG tablet  Commonly known as:  AMARYL  TAKE TWO TABLETS BY MOUTH ONCE DAILY     glucose blood test strip  Commonly known as:  ONE TOUCH ULTRA TEST  Use as instructed to check blood sugar 3 times daily E11.65     Liraglutide 18 MG/3ML Sopn  Inject 1.2 mg daily     montelukast 10 MG tablet  Commonly known as:  SINGULAIR     pioglitazone-metformin 15-850 MG per tablet  Commonly known as:  ACTOPLUS MET  Take 1 tablet by mouth 2 (two) times daily with a meal.     potassium chloride 10 MEQ tablet  Commonly known as:  KLOR-CON M10  Take 1 tablet (10 mEq total) by mouth daily.     sildenafil 50 MG tablet  Commonly known as:  VIAGRA  Take 1 tablet (50 mg total) by mouth as needed for erectile dysfunction.     simvastatin 40 MG tablet  Commonly known as:  ZOCOR     tadalafil 5 MG tablet  Commonly known as:  CIALIS  Take 1 tablet (5 mg total) by mouth daily as needed for erectile dysfunction.     valsartan-hydrochlorothiazide 320-12.5 MG per tablet  Commonly known as:  DIOVAN-HCT  TAKE ONE TABLET BY MOUTH ONCE DAILY     Vitamin D-3 5000 UNITS Tabs  Take 1 capsule by mouth at bedtime.        Allergies:  Allergies  Allergen Reactions  . Atorvastatin Itching  . Dextran Rash    Other Reaction: Not Assessed    Past Medical History  Diagnosis Date  . Hypertension   . Diabetes mellitus   . Hyperlipemia     Past Surgical History  Procedure Laterality Date  . Joint replacement      Family History  Problem Relation Age of Onset  . Cancer Father   . Diabetes Brother     Social History:  reports that he has never smoked. He does not have any smokeless tobacco history on file. He reports that he does not  drink alcohol or use illicit drugs.  Review of Systems:   Hypertension:  BP at home checked recently reading about 120s-130s/70s.  Last time he  had high blood pressure reading from not getting his medications filled consistently but he is doing better with this now No hypokalemia present  Lipids: LDL is still well controlled, had been on Crestor and simvastatin and these were stopped because of increased liver functions  Lab Results  Component Value Date   CHOL 184 05/15/2014   HDL 65.90 05/15/2014   LDLCALC 91 05/15/2014   TRIG 137.0 05/15/2014   CHOLHDL 3 05/15/2014    Abnormal liver functions:  He has had mild increase in liver functions, possibly from fatty liver although his ultrasound was normal in 2011.  Evaluation from Dr. Collene Mares not available in the record He has not had any alcohol intake, nonsteroidal anti-inflammatory drugs Liver functions are consistently normal with stopping his simvastatin   Likely from statin drug but have been only mildly abnormal.   Liver functions are now consistently normal with stopping simvastatin Although he may have some fatty liver his liver functions were still relatively high when his A1c was 6.8   He has had a 4x 5-mm  prolactinoma since 2007 with baseline prolactin level of 101, treated with Dostinex consistently.  He has had better libido with using Dostinex. Last prolactin level was normal at 6.7 and his testosterone level has been normal also  Lab Results  Component Value Date   TESTOSTERONE 435.42 05/15/2014      Examination:   BP 140/76 mmHg  Pulse 83  Temp(Src) 98.2 F (36.8 C)  Resp 14  Ht 5' 10"  (1.778 m)  Wt 218 lb (98.884 kg)  BMI 31.28 kg/m2  SpO2 96%  Body mass index is 31.28 kg/(m^2).     ASSESSMENT/ PLAN:   Diabetes type 2   Blood glucose control is improved with increasing Actoplusmet to twice a day However he is gaining weight and agrees to start 0.6 mg of Victoza. Reassured him that it does not cause pancreatitis or cancer according to numerous studies He will also need to start an exercise program Reminded him to bring his monitor for download  on each visit  Liver function abnormalities: Resolved.  If he needs a statin drug may consider Lescol XL  Hypertension: blood pressure is controlled with continued compliance using medications He will also continue to monitor at home    Patient Instructions  Check blood sugars on waking up ..3  .. times a week Also check blood sugars about 2 hours after a meal and do this after different meals by rotation  Recommended blood sugar levels on waking up is 90-130 and about 2 hours after meal is 140-180 Please bring blood sugar monitor to each visit.  Start walking program  Victoza 0.81m  Potassium 1/day    Elaine Middleton 09/18/2014, 9:11 AM

## 2014-11-18 ENCOUNTER — Other Ambulatory Visit: Payer: Self-pay | Admitting: Endocrinology

## 2014-12-02 ENCOUNTER — Other Ambulatory Visit: Payer: Self-pay | Admitting: Endocrinology

## 2014-12-18 ENCOUNTER — Other Ambulatory Visit: Payer: 59

## 2014-12-24 ENCOUNTER — Ambulatory Visit: Payer: 59 | Admitting: Endocrinology

## 2015-01-02 ENCOUNTER — Other Ambulatory Visit: Payer: Self-pay | Admitting: Endocrinology

## 2015-01-07 ENCOUNTER — Other Ambulatory Visit: Payer: Self-pay | Admitting: Endocrinology

## 2015-02-09 ENCOUNTER — Other Ambulatory Visit: Payer: Self-pay | Admitting: Endocrinology

## 2015-04-10 ENCOUNTER — Other Ambulatory Visit: Payer: Self-pay | Admitting: Endocrinology

## 2015-04-13 ENCOUNTER — Other Ambulatory Visit: Payer: Self-pay | Admitting: Endocrinology

## 2015-04-20 ENCOUNTER — Other Ambulatory Visit: Payer: Self-pay | Admitting: Endocrinology

## 2015-06-01 ENCOUNTER — Other Ambulatory Visit (INDEPENDENT_AMBULATORY_CARE_PROVIDER_SITE_OTHER): Payer: 59

## 2015-06-01 DIAGNOSIS — IMO0002 Reserved for concepts with insufficient information to code with codable children: Secondary | ICD-10-CM

## 2015-06-01 DIAGNOSIS — E1165 Type 2 diabetes mellitus with hyperglycemia: Secondary | ICD-10-CM | POA: Diagnosis not present

## 2015-06-01 DIAGNOSIS — D352 Benign neoplasm of pituitary gland: Secondary | ICD-10-CM

## 2015-06-01 LAB — COMPREHENSIVE METABOLIC PANEL
ALT: 32 U/L (ref 0–53)
AST: 34 U/L (ref 0–37)
Albumin: 4.2 g/dL (ref 3.5–5.2)
Alkaline Phosphatase: 56 U/L (ref 39–117)
BUN: 10 mg/dL (ref 6–23)
CHLORIDE: 101 meq/L (ref 96–112)
CO2: 31 meq/L (ref 19–32)
CREATININE: 1.01 mg/dL (ref 0.40–1.50)
Calcium: 9.8 mg/dL (ref 8.4–10.5)
GFR: 94.17 mL/min (ref >60.00)
Glucose, Bld: 90 mg/dL (ref 70–99)
Potassium: 3.9 mEq/L (ref 3.5–5.1)
SODIUM: 140 meq/L (ref 135–145)
Total Bilirubin: 0.5 mg/dL (ref 0.2–1.2)
Total Protein: 7.8 g/dL (ref 6.0–8.3)

## 2015-06-01 LAB — HEMOGLOBIN A1C: Hgb A1c MFr Bld: 6.5 % (ref 4.6–6.5)

## 2015-06-02 LAB — PROLACTIN: PROLACTIN: 15.2 ng/mL (ref 4.0–15.2)

## 2015-06-04 ENCOUNTER — Encounter: Payer: Self-pay | Admitting: Endocrinology

## 2015-06-04 ENCOUNTER — Ambulatory Visit (INDEPENDENT_AMBULATORY_CARE_PROVIDER_SITE_OTHER): Payer: 59 | Admitting: Endocrinology

## 2015-06-04 VITALS — BP 150/78 | HR 76 | Ht 70.0 in | Wt 208.0 lb

## 2015-06-04 DIAGNOSIS — E1165 Type 2 diabetes mellitus with hyperglycemia: Secondary | ICD-10-CM | POA: Diagnosis not present

## 2015-06-04 DIAGNOSIS — D352 Benign neoplasm of pituitary gland: Secondary | ICD-10-CM

## 2015-06-04 DIAGNOSIS — I10 Essential (primary) hypertension: Secondary | ICD-10-CM | POA: Diagnosis not present

## 2015-06-04 NOTE — Patient Instructions (Addendum)
Check blood sugars on waking up   times a week Also check blood sugars about 2 hours after a meal and do this after different meals by rotation  Recommended blood sugar levels on waking up is 90-130 and about 2 hours after meal is 130-160  Please bring your blood sugar monitor to each visit, thank you  Cut Glimeperide in 1/2 in pms  Victoza 0.6 mg

## 2015-06-04 NOTE — Progress Notes (Signed)
Patient ID: Joshua Vargas, male   DOB: 09-14-1945, 70 y.o.   MRN: [id number]   Reason for Appointment: follow-up of various issues  History of Present Illness   Type 2 DIABETES MELITUS, date of diagnosis: 2002      Previous history: He was initially treated with metformin alone and then subsequently was also given Actos and Amaryl.  He has had fairly good control but somewhat inconsistent because of difficulties with compliance with diet and exercise periodically. A1c previously has fluctuated between about 6.7-7.6 In 6/15 he was started on Victoza which he wanted to try because of difficulty losing weight and better control  Recent history:   Oral hypoglycemic drugs: Actoplusmet twice a day and Amaryl 51m in evening           He has not been seen in follow-up since 09/2014  With Victoza he is able to follow his diet more consistently and keep his weight down Currently taking 0.9 mg He will however  not take it occasionally if he is not eating supper However he thinks he is eating relatively small portions and has lost significant weight since his last visit His A1c has not come down to 6.5 which is better than usual  Also he is on Actoplusmet twice a day  His A1c is better at 7.1 However does not bring his monitor usually to his office visit and not clear how often he is monitoring Continues to take Amaryl 4 mg in evening at suppertime but if he is not eating supper he will skip this     Side effects from medications:  anorexia with 1.2 mg Victoza       Monitors blood glucose: Sporadically   Glucometer:  One Touch.          Blood Glucose readings by recall: 75-98 am, acs 90s   Hypoglycemia frequency:  periodically, occasionally at 3 AM or may breakup with a low sugar      Meals: 3 meals per day.reducing sweets, snacks and meats          Physical activity: exercise:  Walking some regularly now              Dietician visit: Most recent: 2007             Complications: are: Erectile dysfunction   Wt Readings from Last 3 Encounters:  06/04/15 208 lb (94.348 kg)  09/17/14 218 lb (98.884 kg)  05/21/14 212 lb 3.2 oz (96.253 kg)   LABS:  Lab Results  Component Value Date   HGBA1C 6.5 06/01/2015   HGBA1C 7.1* 09/12/2014   HGBA1C 7.6* 05/15/2014   Lab Results  Component Value Date   MICROALBUR 1.2 05/15/2014   LDLCALC 91 05/15/2014   CREATININE 1.01 06/01/2015     OTHER problems are discussed in review of systems   LABS:  Lab on 06/01/2015  Component Date Value Ref Range Status  . Hgb A1c MFr Bld 06/01/2015 6.5  4.6 - 6.5 % Final   Glycemic Control Guidelines for People with Diabetes:Non Diabetic:  <6%Goal of Therapy: <7%Additional Action Suggested:  >8%   . Sodium 06/01/2015 140  135 - 145 mEq/L Final  . Potassium 06/01/2015 3.9  3.5 - 5.1 mEq/L Final  . Chloride 06/01/2015 101  96 - 112 mEq/L Final  . CO2 06/01/2015 31  19 - 32 mEq/L Final  . Glucose, Bld 06/01/2015 90  70 - 99 mg/dL Final  . BUN 06/01/2015 10  6 -  23 mg/dL Final  . Creatinine, Ser 06/01/2015 1.01  0.40 - 1.50 mg/dL Final  . Total Bilirubin 06/01/2015 0.5  0.2 - 1.2 mg/dL Final  . Alkaline Phosphatase 06/01/2015 56  39 - 117 U/L Final  . AST 06/01/2015 34  0 - 37 U/L Final  . ALT 06/01/2015 32  0 - 53 U/L Final  . Total Protein 06/01/2015 7.8  6.0 - 8.3 g/dL Final  . Albumin 06/01/2015 4.2  3.5 - 5.2 g/dL Final  . Calcium 06/01/2015 9.8  8.4 - 10.5 mg/dL Final  . GFR 06/01/2015 94.17  >60.00 mL/min Final  . Prolactin 06/01/2015 15.2  4.0 - 15.2 ng/mL Final      Medication List       This list is accurate as of: 06/04/15  8:16 PM.  Always use your most recent med list.               acetaminophen 500 MG tablet  Commonly known as:  TYLENOL  Take 500 mg by mouth as needed. pain     amLODipine 10 MG tablet  Commonly known as:  NORVASC  TAKE ONE TABLET BY MOUTH DAILY     aspirin 81 MG EC tablet  Take 81 mg by mouth daily.     Azelastine  HCl 0.15 % Soln     cabergoline 0.5 MG tablet  Commonly known as:  DOSTINEX  TAKE ONE-HALF TABLET BY MOUTH TWICE A WEEK (ON  MONDAY  AND  SATURDAY)  SCHEDULE  APPOINTMENT  FOR  FURTHER  REFILLS     doxazosin 2 MG tablet  Commonly known as:  CARDURA  TAKE ONE TABLET BY MOUTH AT BEDTIME     glimepiride 4 MG tablet  Commonly known as:  AMARYL  TAKE TWO TABLETS BY MOUTH ONCE DAILY     glucose blood test strip  Commonly known as:  ONE TOUCH ULTRA TEST  Use as instructed to check blood sugar 3 times daily E11.65     KLOR-CON M10 10 MEQ tablet  Generic drug:  potassium chloride  TAKE ONE TABLET BY MOUTH ONCE DAILY     Liraglutide 18 MG/3ML Sopn  Inject 1.2 mg daily     montelukast 10 MG tablet  Commonly known as:  SINGULAIR     pioglitazone-metformin 15-850 MG tablet  Commonly known as:  ACTOPLUS MET  TAKE ONE TABLET BY MOUTH TWICE DAILY WITH A MEAL     sildenafil 50 MG tablet  Commonly known as:  VIAGRA  Take 1 tablet (50 mg total) by mouth as needed for erectile dysfunction.     simvastatin 40 MG tablet  Commonly known as:  ZOCOR     tadalafil 5 MG tablet  Commonly known as:  CIALIS  Take 1 tablet (5 mg total) by mouth daily as needed for erectile dysfunction.     valsartan-hydrochlorothiazide 320-12.5 MG tablet  Commonly known as:  DIOVAN-HCT  TAKE ONE TABLET BY MOUTH ONCE DAILY     valsartan-hydrochlorothiazide 320-12.5 MG tablet  Commonly known as:  DIOVAN-HCT  TAKE ONE TABLET BY MOUTH ONCE DAILY     Vitamin D-3 5000 UNITS Tabs  Take 1 capsule by mouth at bedtime.        Allergies:  Allergies  Allergen Reactions  . Atorvastatin Itching  . Dextran Rash    Other Reaction: Not Assessed    Past Medical History  Diagnosis Date  . Hypertension   . Diabetes mellitus   . Hyperlipemia  Past Surgical History  Procedure Laterality Date  . Joint replacement      Family History  Problem Relation Age of Onset  . Cancer Father   . Diabetes Brother      Social History:  reports that he has never smoked. He does not have any smokeless tobacco history on file. He reports that he does not drink alcohol or use illicit drugs.  Review of Systems:   Hypertension:  BP at home checked periodically, recently reading 128/78 He has not taken any medication for 2 days since he ran out Currently on a regimen of Diovan HCTZ and Norvasc  Renal function and electrolytes are normal  Lab Results  Component Value Date   CREATININE 1.01 06/01/2015   BUN 10 06/01/2015   NA 140 06/01/2015   K 3.9 06/01/2015   CL 101 06/01/2015   CO2 31 06/01/2015     Lipids: He had been on Crestor and simvastatin and these were stopped because of increased liver functions  Lab Results  Component Value Date   CHOL 184 05/15/2014   HDL 65.90 05/15/2014   LDLCALC 91 05/15/2014   TRIG 137.0 05/15/2014   CHOLHDL 3 05/15/2014    Abnormal liver functions:  He has had mild increase in liver functions, possibly from fatty liver although his ultrasound was normal in 2011.  He has not had any alcohol intake, nonsteroidal anti-inflammatory drugs Liver functions are consistently normal with stopping his simvastatin    He has had a 4x 5-mm  prolactinoma since 2007 with baseline prolactin level of 101, treated with Dostinex .  He has had better libido with using Dostinex.  Last prolactin level was normal at 6.7 but now it is up to about 15.  He thinks that he will occasionally miss his medication  No history of hypogonadism recently  Lab Results  Component Value Date   TESTOSTERONE 435.42 05/15/2014   Erectile dysfunction treated with Cialis   Examination:   BP 150/78 mmHg  Pulse 76  Ht 5' 10"  (1.778 m)  Wt 208 lb (94.348 kg)  BMI 29.84 kg/m2  SpO2 98%  Body mass index is 29.84 kg/(m^2).    Repeat blood pressure 150/78  ASSESSMENT/ PLAN:   Diabetes type 2  See history of present illness for detailed discussion of current diabetes management, blood  sugar patterns and problems identified He is being followed up after several months interval now He has lost weight and probably is eating overall less with using even 0.9 Victoza Also he is getting some tendency to overnight hypoglycemia. His glucose in the office is 139 but he thinks his blood sugars are usually normal before breakfast and supper Again does not bring his monitor as directed for evaluation  For now will reduce his Amaryl to half a tablet and Victoza to 0.6 mg Discussed needing to check readings after meals also Continue increasing exercise Continue ACTOplus met  Liver function abnormalities: Resolved.  If he needs a statin drug may consider Lescol XL  Hypertension: blood pressure is usually controlled with better compliance with medications and higher today because he ran out 2 days ago He will start back on both his medications He will also continue to monitor at home  PROLACTINOMA: Although he has been on medications for several years his prolactin is upper normal now with his being occasionally forgetting medication and continue to treat him with Dostinex   Patient Instructions  Check blood sugars on waking up   times a week Also  check blood sugars about 2 hours after a meal and do this after different meals by rotation  Recommended blood sugar levels on waking up is 90-130 and about 2 hours after meal is 130-160  Please bring your blood sugar monitor to each visit, thank you  Cut Glimeperide in 1/2 in pms  Victoza 0.6 mg     Counseling time on subjects discussed above is over 50% of today's 25 minute visit  Davyd Podgorski 06/04/2015, 8:16 PM

## 2015-06-08 ENCOUNTER — Other Ambulatory Visit: Payer: Self-pay | Admitting: Endocrinology

## 2015-06-14 ENCOUNTER — Other Ambulatory Visit: Payer: Self-pay | Admitting: Endocrinology

## 2015-06-29 ENCOUNTER — Ambulatory Visit: Payer: Self-pay | Admitting: Allergy and Immunology

## 2015-07-13 ENCOUNTER — Other Ambulatory Visit: Payer: Self-pay | Admitting: Endocrinology

## 2015-07-28 LAB — HM DIABETES EYE EXAM

## 2015-07-31 ENCOUNTER — Ambulatory Visit (INDEPENDENT_AMBULATORY_CARE_PROVIDER_SITE_OTHER): Payer: 59 | Admitting: Allergy and Immunology

## 2015-07-31 ENCOUNTER — Encounter: Payer: Self-pay | Admitting: Allergy and Immunology

## 2015-07-31 VITALS — BP 150/90 | HR 88 | Temp 98.8°F | Resp 16 | Ht 69.29 in | Wt 213.8 lb

## 2015-07-31 DIAGNOSIS — E0800 Diabetes mellitus due to underlying condition with hyperosmolarity without nonketotic hyperglycemic-hyperosmolar coma (NKHHC): Secondary | ICD-10-CM | POA: Diagnosis not present

## 2015-07-31 DIAGNOSIS — J3089 Other allergic rhinitis: Secondary | ICD-10-CM | POA: Diagnosis not present

## 2015-07-31 DIAGNOSIS — J339 Nasal polyp, unspecified: Secondary | ICD-10-CM | POA: Diagnosis not present

## 2015-07-31 MED ORDER — BECLOMETHASONE DIPROPIONATE 80 MCG/ACT NA AERS: 1.0000 | INHALATION_SPRAY | Freq: Two times a day (BID) | NASAL | Status: DC

## 2015-07-31 MED ORDER — METHYLPREDNISOLONE ACETATE 80 MG/ML IJ SUSP
80.0000 mg | Freq: Once | INTRAMUSCULAR | Status: AC
  Administered 2015-07-31: 80 mg via INTRAMUSCULAR

## 2015-07-31 MED ORDER — PREDNISONE 1 MG PO TABS
10.0000 mg | ORAL_TABLET | ORAL | Status: DC
Start: 2015-07-31 — End: 2015-10-29

## 2015-07-31 NOTE — Assessment & Plan Note (Signed)
   Aeroallergen avoidance measures have been discussed and provided in written form.  A sample and prescription have been provided for Qnasl 80 g, one actuation per nostril twice daily as needed.  Proper technique has been discussed and demonstrated.  I have also recommended nasal saline spray (i.e. Simply Saline) as needed prior to medicated nasal sprays.  If needed, he may add azelastine nasal spray, 1-2 sprays twice daily as needed.  If allergen avoidance measures and medications fail to adequately relieve symptoms, aeroallergen immunotherapy will be considered.

## 2015-07-31 NOTE — Progress Notes (Signed)
New Patient Note  RE: Joshua Vargas MRN: [id number] DOB: 1946-01-07 Date of Office Visit: 07/31/2015  Referring provider: Seward Carol, MD Primary care provider: Kandice Hams, MD  Chief Complaint: Allergic Rhinitis ; Sinus Problem; and Nasal Polyps   History of present illness: HPI Comments: Joshua Vargas is a 70 y.o. male presenting today for consultation of rhinitis.  He complains of severe persistent nasal congestion as well as clear rhinorrhea and occasional frontal sinus pressure.  These symptoms occur year around but her more severe in the springtime.  He uses azelastine nasal spray and montelukast without adequate symptom relief.  He was evaluated by an otolaryngologist last year and told that he has nasal polyps.  He denies anosmia.  He is currently not using a nasal steroid spray.   Assessment and plan: Perennial and seasonal allergic rhinitis  Aeroallergen avoidance measures have been discussed and provided in written form.  A sample and prescription have been provided for Qnasl 80 g, one actuation per nostril twice daily as needed.  Proper technique has been discussed and demonstrated.  I have also recommended nasal saline spray (i.e. Simply Saline) as needed prior to medicated nasal sprays.  If needed, he may add azelastine nasal spray, 1-2 sprays twice daily as needed.  If allergen avoidance measures and medications fail to adequately relieve symptoms, aeroallergen immunotherapy will be considered.  Nasal polyps  Depo-Medrol 80 mg was administered in the office.  Prednisone has been provided and is to be started tomorrow as follows: 20 mg daily x 4 days, 10 mg x1 day, then stop.  A prescription has been provided for Qnasl (as above).  The importance of daily use of intranasal steroid has been discussed and emphasized.  For now, continue montelukast 10 mg daily.  If symptoms persist or progress, polypectomy be may be considered.  Diabetes mellitus  ([place])  The patient has been asked to carefully monitor blood glucose levels while on prednisone.  He has verbalized understanding and has agreed to do so.    Meds ordered this encounter  Medications  . methylPREDNISolone acetate (DEPO-MEDROL) injection 80 mg    Sig:   . Beclomethasone Dipropionate (QNASL) 80 MCG/ACT AERS    Sig: Place 1 spray into both nostrils 2 (two) times daily.    Dispense:  8.7 g    Refill:  5  . predniSONE (DELTASONE) tablet 10 mg    Sig:     Diagnositics: Epicutaneous testing: Positive to tree pollen. Intradermal testing: Positive to grass pollens, ragweed pollen, molds, cockroach antigen, and dust mite antigen.    Physical examination: Blood pressure 150/90, pulse 88, temperature 98.8 F (37.1 C), temperature source Oral, resp. rate 16, height 5' 9.29" (1.76 m), weight 213 lb 13.5 oz (97 kg).  General: Alert, interactive, in no acute distress. HEENT: TMs pearly gray, turbinates edematous with thick discharge, post-pharynx moderately erythematous.   Neck: Supple without lymphadenopathy. Lungs: Clear to auscultation without wheezing, rhonchi or rales. CV: Normal S1, S2 without murmurs. Abdomen: Nondistended, nontender. Skin: Warm and dry, without lesions or rashes. Extremities:  No clubbing, cyanosis or edema. Neuro:   Grossly intact.  Review of systems:  Review of Systems  Constitutional: Negative for fever, chills and weight loss.  HENT: Positive for congestion. Negative for nosebleeds.   Eyes: Negative for blurred vision.  Respiratory: Negative for hemoptysis, shortness of breath and wheezing.   Cardiovascular: Negative for chest pain.  Gastrointestinal: Negative for diarrhea and constipation.  Genitourinary: Negative for dysuria.  Musculoskeletal: Negative for myalgias and joint pain.  Skin: Negative for itching and rash.  Neurological: Positive for headaches. Negative for dizziness.  Endo/Heme/Allergies: Positive for environmental  allergies. Does not bruise/bleed easily.    Past medical history:  Past Medical History  Diagnosis Date  . Hypertension   . Diabetes mellitus   . Hyperlipemia     Past surgical history:  Past Surgical History  Procedure Laterality Date  . Joint replacement      Family history: Family History  Problem Relation Age of Onset  . Cancer Father   . Diabetes Brother     Social history: Social History   Social History  . Marital Status: Married    Spouse Name: N/A  . Number of Children: N/A  . Years of Education: N/A   Occupational History  . Not on file.   Social History Main Topics  . Smoking status: Never Smoker   . Smokeless tobacco: Not on file  . Alcohol Use: No  . Drug Use: No  . Sexual Activity: No   Other Topics Concern  . Not on file   Social History Narrative   Environmental History: The patient lives in a 69 year old house with carpeting throughout, gas heat, and central air.  He is a nonsmoker without pets.    Medication List       This list is accurate as of: 07/31/15  3:30 PM.  Always use your most recent med list.               acetaminophen 500 MG tablet  Commonly known as:  TYLENOL  Take 500 mg by mouth as needed. pain     amLODipine 10 MG tablet  Commonly known as:  NORVASC  TAKE ONE TABLET BY MOUTH DAILY     aspirin 81 MG EC tablet  Take 81 mg by mouth daily.     Azelastine HCl 0.15 % Soln  Place 1 spray into both nostrils as needed.     Beclomethasone Dipropionate 80 MCG/ACT Aers  Commonly known as:  QNASL  Place 1 spray into both nostrils 2 (two) times daily.     cabergoline 0.5 MG tablet  Commonly known as:  DOSTINEX  TAKE ONE-HALF TABLET BY MOUTH TWICE A WEEK (ON  MONDAY  AND  SATURDAY)  SCHEDULE  APPOINTMENT  FOR  FURTHER  REFILLS     CIALIS 5 MG tablet  Generic drug:  tadalafil  TAKE ONE TABLET BY MOUTH DAILY AS NEEDED FOR  ERECTILE  DYSFUNCTION     doxazosin 2 MG tablet  Commonly known as:  CARDURA  TAKE ONE  TABLET BY MOUTH AT BEDTIME     glimepiride 4 MG tablet  Commonly known as:  AMARYL  TAKE TWO TABLETS BY MOUTH ONCE DAILY**PARTIAL PRESCRIPTION GIVEN SINCE PATIENT NEEDS FOLLOW-UP APPOINTMENT BEFORE ANY REFILLS**     glucose blood test strip  Commonly known as:  ONE TOUCH ULTRA TEST  Use as instructed to check blood sugar 3 times daily E11.65     KLOR-CON M10 10 MEQ tablet  Generic drug:  potassium chloride  TAKE ONE TABLET BY MOUTH ONCE DAILY     montelukast 10 MG tablet  Commonly known as:  SINGULAIR     pioglitazone-metformin 15-850 MG tablet  Commonly known as:  ACTOPLUS MET  TAKE ONE TABLET BY MOUTH TWICE DAILY WITH A MEAL     sildenafil 50 MG tablet  Commonly known as:  VIAGRA  Take 1 tablet (50 mg total) by  mouth as needed for erectile dysfunction.     simvastatin 40 MG tablet  Commonly known as:  ZOCOR  Reported on 07/31/2015     valsartan-hydrochlorothiazide 320-12.5 MG tablet  Commonly known as:  DIOVAN-HCT  TAKE ONE TABLET BY MOUTH ONCE DAILY     VICTOZA 18 MG/3ML Sopn  Generic drug:  Liraglutide  INJECT 1.2 MG DAILY     Vitamin D-3 5000 UNITS Tabs  Take 1 capsule by mouth at bedtime.        Known medication allergies: Allergies  Allergen Reactions  . Atorvastatin Itching  . Dextran Rash    Other Reaction: Not Assessed    I appreciate the opportunity to take part in this Masai's care. Please do not hesitate to contact me with questions.  Sincerely,   R. Edgar Frisk, MD

## 2015-07-31 NOTE — Assessment & Plan Note (Signed)
   The patient has been asked to carefully monitor blood glucose levels while on prednisone.  He has verbalized understanding and has agreed to do so. 

## 2015-07-31 NOTE — Patient Instructions (Addendum)
Perennial and seasonal allergic rhinitis  Aeroallergen avoidance measures have been discussed and provided in written form.  A sample and prescription have been provided for Qnasl 80 g, one actuation per nostril twice daily as needed.  Proper technique has been discussed and demonstrated.  I have also recommended nasal saline spray (i.e. Simply Saline) as needed prior to medicated nasal sprays.  If needed, he may add azelastine nasal spray, 1-2 sprays twice daily as needed.  If allergen avoidance measures and medications fail to adequately relieve symptoms, aeroallergen immunotherapy will be considered.  Nasal polyps  Depo-Medrol 80 mg was administered in the office.  Prednisone has been provided and is to be started tomorrow as follows: 20 mg daily x 4 days, 10 mg x1 day, then stop.  A prescription has been provided for Qnasl (as above).  The importance of daily use of intranasal steroid has been discussed and emphasized.  For now, continue montelukast 10 mg daily.  If symptoms persist or progress, polypectomy be may be considered.  Diabetes mellitus ([place])  The patient has been asked to carefully monitor blood glucose levels while on prednisone.  He has verbalized understanding and has agreed to do so.    Return in about 4 months (around 11/30/2015), or if symptoms worsen or fail to improve.  Reducing Pollen Exposure  The American Academy of Allergy, Asthma and Immunology suggests the following steps to reduce your exposure to pollen during allergy seasons.    1. Do not hang sheets or clothing out to dry; pollen may collect on these items. 2. Do not mow lawns or spend time around freshly cut grass; mowing stirs up pollen. 3. Keep windows closed at night.  Keep car windows closed while driving. 4. Minimize morning activities outdoors, a time when pollen counts are usually at their highest. 5. Stay indoors as much as possible when pollen counts or humidity is high and on  windy days when pollen tends to remain in the air longer. 6. Use air conditioning when possible.  Many air conditioners have filters that trap the pollen spores. 7. Use a HEPA room air filter to remove pollen form the indoor air you breathe.   Control of House Dust Mite Allergen  House dust mites play a major role in allergic asthma and rhinitis.  They occur in environments with high humidity wherever human skin, the food for dust mites is found. High levels have been detected in dust obtained from mattresses, pillows, carpets, upholstered furniture, bed covers, clothes and soft toys.  The principal allergen of the house dust mite is found in its feces.  A gram of dust may contain 1,000 mites and 250,000 fecal particles.  Mite antigen is easily measured in the air during house cleaning activities.    1. Encase mattresses, including the box spring, and pillow, in an air tight cover.  Seal the zipper end of the encased mattresses with wide adhesive tape. 2. Wash the bedding in water of 130 degrees Farenheit weekly.  Avoid cotton comforters/quilts and flannel bedding: the most ideal bed covering is the dacron comforter. 3. Remove all upholstered furniture from the bedroom. 4. Remove carpets, carpet padding, rugs, and non-washable window drapes from the bedroom.  Wash drapes weekly or use plastic window coverings. 5. Remove all non-washable stuffed toys from the bedroom.  Wash stuffed toys weekly. 6. Have the room cleaned frequently with a vacuum cleaner and a damp dust-mop.  The patient should not be in a room which is being cleaned  and should wait 1 hour after cleaning before going into the room. 7. Close and seal all heating outlets in the bedroom.  Otherwise, the room will become filled with dust-laden air.  An electric heater can be used to heat the room. 8. Reduce indoor humidity to less than 50%.  Do not use a humidifier.  Control of Mold Allergen  Mold and fungi can grow on a variety of  surfaces provided certain temperature and moisture conditions exist.  Outdoor molds grow on plants, decaying vegetation and soil.  The major outdoor mold, Alternaria and Cladosporium, are found in very high numbers during hot and dry conditions.  Generally, a late Summer - Fall peak is seen for common outdoor fungal spores.  Rain will temporarily lower outdoor mold spore count, but counts rise rapidly when the rainy period ends.  The most important indoor molds are Aspergillus and Penicillium.  Dark, humid and poorly ventilated basements are ideal sites for mold growth.  The next most common sites of mold growth are the bathroom and the kitchen.  Outdoor [employer] 1. Use air conditioning and keep windows closed 2. Avoid exposure to decaying vegetation. 3. Avoid leaf raking. 4. Avoid grain handling. 5. Consider wearing a face mask if working in moldy areas.  Indoor Mold Control 1. Maintain humidity below 50%. 2. Clean washable surfaces with 5% bleach solution. 3. Remove sources e.g. Contaminated carpets.  Control of Cockroach Allergen  Cockroach allergen has been identified as an important cause of acute attacks of asthma, especially in urban settings.  There are fifty-five species of cockroach that exist in the [place], however only three, the [place], [place] species produce allergen that can affect patients with Asthma.  Allergens can be obtained from fecal particles, egg casings and secretions from cockroaches.    1. Remove food sources. 2. Reduce access to water. 3. Seal access and entry points. 4. Spray runways with 0.5-1% Diazinon or Chlorpyrifos 5. Blow boric acid power under stoves and refrigerator. 6. Place bait stations (hydramethylnon) at feeding sites.

## 2015-07-31 NOTE — Assessment & Plan Note (Addendum)
   Depo-Medrol 80 mg was administered in the office.  Prednisone has been provided and is to be started tomorrow as follows: 20 mg daily x 4 days, 10 mg x1 day, then stop.  A prescription has been provided for Qnasl (as above).  The importance of daily use of intranasal steroid has been discussed and emphasized.  For now, continue montelukast 10 mg daily.  If symptoms persist or progress, polypectomy be may be considered.

## 2015-08-11 ENCOUNTER — Encounter: Payer: Self-pay | Admitting: *Deleted

## 2015-08-17 ENCOUNTER — Other Ambulatory Visit: Payer: Self-pay | Admitting: Endocrinology

## 2015-08-17 MED ORDER — CABERGOLINE 0.5 MG PO TABS: ORAL_TABLET | ORAL | Status: DC

## 2015-08-17 NOTE — Addendum Note (Signed)
Addended by: Verlin Grills T on: 08/17/2015 09:17 AM   Modules accepted: Orders

## 2015-09-13 ENCOUNTER — Other Ambulatory Visit: Payer: Self-pay | Admitting: Endocrinology

## 2015-09-14 ENCOUNTER — Other Ambulatory Visit: Payer: Self-pay | Admitting: *Deleted

## 2015-09-17 ENCOUNTER — Other Ambulatory Visit: Payer: Self-pay | Admitting: Internal Medicine

## 2015-09-17 DIAGNOSIS — R1084 Generalized abdominal pain: Secondary | ICD-10-CM

## 2015-09-23 ENCOUNTER — Inpatient Hospital Stay: Admission: RE | Admit: 2015-09-23 | Payer: 59 | Source: Ambulatory Visit

## 2015-09-28 ENCOUNTER — Other Ambulatory Visit: Payer: 59

## 2015-10-01 ENCOUNTER — Ambulatory Visit: Payer: 59 | Admitting: Endocrinology

## 2015-10-26 ENCOUNTER — Other Ambulatory Visit (INDEPENDENT_AMBULATORY_CARE_PROVIDER_SITE_OTHER): Payer: 59

## 2015-10-26 DIAGNOSIS — E1165 Type 2 diabetes mellitus with hyperglycemia: Secondary | ICD-10-CM | POA: Diagnosis not present

## 2015-10-26 LAB — COMPREHENSIVE METABOLIC PANEL
ALBUMIN: 4.1 g/dL (ref 3.5–5.2)
ALK PHOS: 60 U/L (ref 39–117)
ALT: 24 U/L (ref 0–53)
AST: 30 U/L (ref 0–37)
BUN: 13 mg/dL (ref 6–23)
CALCIUM: 9.5 mg/dL (ref 8.4–10.5)
CO2: 28 mEq/L (ref 19–32)
Chloride: 98 mEq/L (ref 96–112)
Creatinine, Ser: 0.97 mg/dL (ref 0.40–1.50)
GFR: 98.55 mL/min (ref >60.00)
Glucose, Bld: 190 mg/dL — ABNORMAL HIGH (ref 70–99)
POTASSIUM: 3.2 meq/L — AB (ref 3.5–5.1)
Sodium: 134 mEq/L — ABNORMAL LOW (ref 135–145)
TOTAL PROTEIN: 7.6 g/dL (ref 6.0–8.3)
Total Bilirubin: 0.5 mg/dL (ref 0.2–1.2)

## 2015-10-26 LAB — LIPID PANEL
CHOLESTEROL: 224 mg/dL — AB (ref 0–200)
HDL: 47 mg/dL (ref >39.00)
NonHDL: 177.4
Total CHOL/HDL Ratio: 5
Triglycerides: 324 mg/dL — ABNORMAL HIGH (ref 0.0–149.0)
VLDL: 64.8 mg/dL — ABNORMAL HIGH (ref 0.0–40.0)

## 2015-10-26 LAB — LDL CHOLESTEROL, DIRECT: Direct LDL: 98 mg/dL

## 2015-10-26 LAB — MICROALBUMIN / CREATININE URINE RATIO
CREATININE, U: 150.7 mg/dL
MICROALB UR: 5.8 mg/dL — AB (ref 0.0–1.9)
MICROALB/CREAT RATIO: 3.8 mg/g (ref 0.0–30.0)

## 2015-10-26 LAB — HEMOGLOBIN A1C: Hgb A1c MFr Bld: 7.4 % — ABNORMAL HIGH (ref 4.6–6.5)

## 2015-10-27 ENCOUNTER — Ambulatory Visit (INDEPENDENT_AMBULATORY_CARE_PROVIDER_SITE_OTHER): Payer: 59 | Admitting: Allergy and Immunology

## 2015-10-27 ENCOUNTER — Encounter: Payer: Self-pay | Admitting: Allergy and Immunology

## 2015-10-27 VITALS — BP 146/92 | HR 72 | Resp 18

## 2015-10-27 DIAGNOSIS — E0800 Diabetes mellitus due to underlying condition with hyperosmolarity without nonketotic hyperglycemic-hyperosmolar coma (NKHHC): Secondary | ICD-10-CM

## 2015-10-27 DIAGNOSIS — J339 Nasal polyp, unspecified: Secondary | ICD-10-CM | POA: Diagnosis not present

## 2015-10-27 DIAGNOSIS — J3089 Other allergic rhinitis: Secondary | ICD-10-CM | POA: Diagnosis not present

## 2015-10-27 MED ORDER — METHYLPREDNISOLONE ACETATE 80 MG/ML IJ SUSP
80.0000 mg | Freq: Once | INTRAMUSCULAR | Status: AC
  Administered 2015-10-27: 80 mg via INTRAMUSCULAR

## 2015-10-27 MED ORDER — AZELASTINE HCL 0.15 % NA SOLN: NASAL | Status: DC

## 2015-10-27 MED ORDER — FLUTICASONE PROPIONATE 50 MCG/ACT NA SUSP: 1.0000 | Freq: Two times a day (BID) | NASAL | Status: DC

## 2015-10-27 MED ORDER — MONTELUKAST SODIUM 10 MG PO TABS: 10.0000 mg | ORAL_TABLET | Freq: Every day | ORAL | Status: DC

## 2015-10-27 NOTE — Progress Notes (Signed)
Follow-up Note  Referring Provider: Seward Carol, MD Primary Provider: Kandice Hams, MD Date of Office Visit: 10/27/2015  Subjective:   Joshua Vargas (DOB: [birth year]) is a 70 y.o. male who returns to the [facility] on 10/27/2015 in re-evaluation of the following:  HPI: Philo returns to this clinic in reevaluation of his allergic rhinoconjunctivitis and nasal polyps. Apparently he's been diagnosed with nasal polyps by a ear nose and throat physician at some point in the past and he requires administration of systemic steroids about 3 times per year to get rid of his severe nasal congestion. Over the course of the past 3 weeks he's developed severe nasal congestion and complete anosmia and can't sleep at nighttime secondary to this issue. He wants another systemic steroid to treat this inflammation as it is only steroids that results in an opening of his airway. It should be noted that he saw Dr. Verlin Fester in April of this year and also received a systemic steroid injection but he did not follow-up with consistent use of the nasal steroids or the use of a montelukast tablet. He thinks that he may of been skin tested in the past but is not entirely sure. He does not appear to perform any type of allergen avoidance measures.    Medication List           acetaminophen 500 MG tablet  Commonly known as:  TYLENOL  Take 500 mg by mouth as needed. pain     amLODipine 10 MG tablet  Commonly known as:  NORVASC  TAKE ONE TABLET BY MOUTH ONCE DAILY     aspirin 81 MG EC tablet  Take 81 mg by mouth daily.     Azelastine HCl 0.15 % Soln  Place 1 spray into both nostrils as needed.     Beclomethasone Dipropionate 80 MCG/ACT Aers  Commonly known as:  QNASL  Place 1 spray into both nostrils 2 (two) times daily.     cabergoline 0.5 MG tablet  Commonly known as:  DOSTINEX  Take one half-tablet by mouth twice a week (on Monday and Saturday)     CIALIS 5 MG tablet    Generic drug:  tadalafil  TAKE ONE TABLET BY MOUTH DAILY AS NEEDED FOR  ERECTILE  DYSFUNCTION     doxazosin 2 MG tablet  Commonly known as:  CARDURA  TAKE ONE TABLET BY MOUTH AT BEDTIME     glimepiride 4 MG tablet  Commonly known as:  AMARYL  TAKE TWO TABLETS BY MOUTH ONCE DAILY**PARTIAL PRESCRIPTION GIVEN SINCE PATIENT NEEDS FOLLOW-UP APPOINTMENT BEFORE ANY REFILLS**     glucose blood test strip  Commonly known as:  ONE TOUCH ULTRA TEST  Use as instructed to check blood sugar 3 times daily E11.65     KLOR-CON M10 10 MEQ tablet  Generic drug:  potassium chloride  TAKE ONE TABLET BY MOUTH ONCE DAILY     montelukast 10 MG tablet  Commonly known as:  SINGULAIR     pioglitazone-metformin 15-850 MG tablet  Commonly known as:  ACTOPLUS MET  TAKE ONE TABLET BY MOUTH TWICE DAILY WITH A MEAL     sildenafil 50 MG tablet  Commonly known as:  VIAGRA  Take 1 tablet (50 mg total) by mouth as needed for erectile dysfunction.     simvastatin 40 MG tablet  Commonly known as:  ZOCOR  Reported on 07/31/2015     valsartan-hydrochlorothiazide 320-12.5 MG tablet  Commonly known as:  DIOVAN-HCT  TAKE ONE TABLET BY MOUTH ONCE DAILY     VICTOZA 18 MG/3ML Sopn  Generic drug:  Liraglutide  INJECT 1.2 MG DAILY     Vitamin D-3 5000 UNITS Tabs  Take 1 capsule by mouth at bedtime.        Past Medical History  Diagnosis Date  . Hypertension   . Diabetes mellitus   . Hyperlipemia     Past Surgical History  Procedure Laterality Date  . Joint replacement      Allergies  Allergen Reactions  . Atorvastatin Itching  . Dextran Rash    Other Reaction: Not Assessed    Review of systems negative except as noted in HPI / PMHx or noted below:  Review of Systems  Constitutional: Negative.   HENT: Negative.   Eyes: Negative.   Respiratory: Negative.   Cardiovascular: Negative.   Gastrointestinal: Negative.   Genitourinary: Negative.   Musculoskeletal: Negative.   Skin: Negative.    Neurological: Negative.   Endo/Heme/Allergies: Negative.   Psychiatric/Behavioral: Negative.      Objective:   Filed Vitals:   10/27/15 1151  BP: 146/92  Pulse: 72  Resp: 18          Physical Exam  Constitutional: He is well-developed, well-nourished, and in no distress.  Nasal voice  HENT:  Head: Normocephalic.  Right Ear: Tympanic membrane, external ear and ear canal normal.  Left Ear: Tympanic membrane, external ear and ear canal normal.  Nose: Mucosal edema (Slightly erythematous. No nasal polyps seen) present. No rhinorrhea.  Mouth/Throat: Uvula is midline, oropharynx is clear and moist and mucous membranes are normal. No oropharyngeal exudate.  Eyes: Conjunctivae are normal.  Neck: Trachea normal. No tracheal tenderness present. No tracheal deviation present. No thyromegaly present.  Cardiovascular: Normal rate, regular rhythm, S1 normal, S2 normal and normal heart sounds.   No murmur heard. Pulmonary/Chest: Breath sounds normal. No stridor. No respiratory distress. He has no wheezes. He has no rales.  Musculoskeletal: He exhibits no edema.  Lymphadenopathy:       Head (right side): No tonsillar adenopathy present.       Head (left side): No tonsillar adenopathy present.    He has no cervical adenopathy.  Neurological: He is alert. Gait normal.  Skin: No rash noted. He is not diaphoretic. No erythema. Nails show no clubbing.  Psychiatric: Mood and affect normal.    Diagnostics: None  .    Assessment and Plan:   1. Perennial and seasonal allergic rhinitis   2. Nasal polyps   3. Diabetes mellitus due to underlying condition with hyperosmolarity without coma, without long-term current use of insulin (HCC)     1. Depo-Medrol 80 IM delivered in clinic today  2. Every day, regardless of symptoms, use the following:   A. Flonase one spray each nostril twice a day  B. Azelastine one spray each nostril twice a day  C. montelukast 10 mg one tablet once a  day  3. Check blood sugars secondary to steroid use  4. Blood - area 2 aero allergen profile. Possible immunotherapy?  5. Can add nasal saline and over-the-counter antihistamine if needed  6. Further evaluation and treatment?  7. Return to clinic in 6 months or earlier if problem  8. Obtain fall flu vaccine  Xzaviar needs to use preventative medications in the hope of preventing him from developing recurrent problems with significant nasal mucosal swelling and possibly nasal polyps. I think it would be worthwhile for him to attempt to perform allergen  avoidance measures and we'll see what type of immunologic hypersensitivity has directed against allergens by checking an area 2 aero allergen profile and possibly considering a course of immunotherapy. I've also had a talk with her today about the fact that he has diabetes and is now using a systemic steroid to treat his inflammatory condition. He needs to follow his blood sugars. I talked about the side effects of using recurrent steroids throughout the year including cataract formation and osteoporosis and loss of control regarding his hyperglycemia.  Allena Katz, MD [facility]

## 2015-10-27 NOTE — Patient Instructions (Addendum)
  1. Depo-Medrol 80 IM delivered in clinic today  2. Every day, regardless of symptoms, use the following:   A. Flonase one spray each nostril twice a day  B. Azelastine one spray each nostril twice a day  C. montelukast 10 mg one tablet once a day  3. Check blood sugars secondary to steroid use  4. Blood - area 2 aero allergen profile. Possible immunotherapy?  5. Can add nasal saline and over-the-counter antihistamine if needed  6. Further evaluation and treatment?  7. Return to clinic in 6 months or earlier if problem  8. Obtain fall flu vaccine

## 2015-10-29 ENCOUNTER — Encounter: Payer: Self-pay | Admitting: Endocrinology

## 2015-10-29 ENCOUNTER — Ambulatory Visit (INDEPENDENT_AMBULATORY_CARE_PROVIDER_SITE_OTHER): Payer: 59 | Admitting: Endocrinology

## 2015-10-29 VITALS — BP 146/74 | HR 92 | Ht 69.0 in | Wt 212.0 lb

## 2015-10-29 DIAGNOSIS — E782 Mixed hyperlipidemia: Secondary | ICD-10-CM

## 2015-10-29 DIAGNOSIS — E1165 Type 2 diabetes mellitus with hyperglycemia: Secondary | ICD-10-CM

## 2015-10-29 DIAGNOSIS — E876 Hypokalemia: Secondary | ICD-10-CM

## 2015-10-29 DIAGNOSIS — D352 Benign neoplasm of pituitary gland: Secondary | ICD-10-CM

## 2015-10-29 MED ORDER — LIRAGLUTIDE 18 MG/3ML ~~LOC~~ SOPN: PEN_INJECTOR | SUBCUTANEOUS | Status: DC

## 2015-10-29 MED ORDER — GLIMEPIRIDE 4 MG PO TABS: ORAL_TABLET | ORAL | Status: DC

## 2015-10-29 MED ORDER — POTASSIUM CHLORIDE CRYS ER 10 MEQ PO TBCR: 10.0000 meq | EXTENDED_RELEASE_TABLET | Freq: Every day | ORAL | Status: DC

## 2015-10-29 MED ORDER — PIOGLITAZONE HCL-METFORMIN HCL 15-850 MG PO TABS: 1.0000 | ORAL_TABLET | Freq: Two times a day (BID) | ORAL | Status: DC

## 2015-10-29 MED ORDER — AMLODIPINE BESYLATE 10 MG PO TABS
10.0000 mg | ORAL_TABLET | Freq: Every day | ORAL | Status: DC
Start: 2015-10-29 — End: 2016-04-19

## 2015-10-29 MED ORDER — CABERGOLINE 0.5 MG PO TABS: ORAL_TABLET | ORAL | Status: DC

## 2015-10-29 MED ORDER — AMLODIPINE BESYLATE 10 MG PO TABS: 10.0000 mg | ORAL_TABLET | Freq: Every day | ORAL | Status: DC

## 2015-10-29 MED ORDER — VALSARTAN-HYDROCHLOROTHIAZIDE 320-12.5 MG PO TABS: ORAL_TABLET | ORAL | Status: DC

## 2015-10-29 NOTE — Progress Notes (Signed)
Patient ID: Joshua Vargas, male   DOB: [birth year], 70 y.o.   MRN: [id number]   Reason for Appointment: follow-up of various issues  History of Present Illness   Type 2 DIABETES MELITUS, date of diagnosis: 2002      Previous history: He was initially treated with metformin alone and then subsequently was also given Actos and Amaryl.  He has had fairly good control but somewhat inconsistent because of difficulties with compliance with diet and exercise periodically. A1c previously has fluctuated between about 6.7-7.6 In 6/15 he was started on Victoza which he wanted to try because of difficulty losing weight and better control  Recent history:   Oral hypoglycemic drugs: Actoplusmet twice a day and Amaryl 82m in evening           He has not been seen in follow-up since 2/17  Current management, blood sugar patterns and problems identified:  His A1c has been mostly over 7%  It is higher now with his being noncompliant with his medications and running out of medications including Victoza  He still has difficulty losing weight  Does not bring his monitor and does not check his readings after meals consistently.  He has only some exercise  No hypoglycemia with taking Amaryl  He was taking 0.9 mg Victoza but more recently taking only 0.6 mg     Side effects from medications:  anorexia with 1.2 mg Victoza       Monitors blood glucose: Sporadically   Glucometer:  One Touch.          Blood Glucose readings by recall:  On rx 110   Hypoglycemia, none   Meals: 3 meals per day.reducing sweets, snacks and meats          Physical activity: exercise:  Walking some, 10-20 min              Dietician visit: Most recent: [id number]          Complications: are: Erectile dysfunction   Wt Readings from Last 3 Encounters:  10/29/15 212 lb (96.163 kg)  07/31/15 213 lb 13.5 oz (97 kg)  06/04/15 208 lb (94.348 kg)   LABS:  Lab Results  Component Value Date   HGBA1C 7.4*  10/26/2015   HGBA1C 6.5 06/01/2015   HGBA1C 7.1* 09/12/2014   Lab Results  Component Value Date   MICROALBUR 5.8* 10/26/2015   [address] 05/15/2014   CREATININE 0.97 10/26/2015     OTHER problems are discussed in review of systems   LABS:  Lab on 10/26/2015  Component Date Value Ref Range Status  . Hgb A1c MFr Bld 10/26/2015 7.4* 4.6 - 6.5 % Final   Glycemic Control Guidelines for People with Diabetes:Non Diabetic:  <6%Goal of Therapy: <7%Additional Action Suggested:  >8%   . Sodium 10/26/2015 134* 135 - 145 mEq/L Final  . Potassium 10/26/2015 3.2* 3.5 - 5.1 mEq/L Final  . Chloride 10/26/2015 98  96 - 112 mEq/L Final  . CO2 10/26/2015 28  19 - 32 mEq/L Final  . Glucose, Bld 10/26/2015 190* 70 - 99 mg/dL Final  . BUN 10/26/2015 13  6 - 23 mg/dL Final  . Creatinine, Ser 10/26/2015 0.97  0.40 - 1.50 mg/dL Final  . Total Bilirubin 10/26/2015 0.5  0.2 - 1.2 mg/dL Final  . Alkaline Phosphatase 10/26/2015 60  39 - 117 U/L Final  . AST 10/26/2015 30  0 - 37 U/L Final  . ALT 10/26/2015 24  0 - 53  U/L Final  . Total Protein 10/26/2015 7.6  6.0 - 8.3 g/dL Final  . Albumin 10/26/2015 4.1  3.5 - 5.2 g/dL Final  . Calcium 10/26/2015 9.5  8.4 - 10.5 mg/dL Final  . GFR 10/26/2015 98.55  >60.00 mL/min Final  . Microalb, Ur 10/26/2015 5.8* 0.0 - 1.9 mg/dL Final  . Creatinine,U 10/26/2015 150.7   Final  . Microalb Creat Ratio 10/26/2015 3.8  0.0 - 30.0 mg/g Final  . Cholesterol 10/26/2015 224* 0 - 200 mg/dL Final   ATP III Classification       Desirable:  < 200 mg/dL               Borderline High:  200 - 239 mg/dL          High:  > = 240 mg/dL  . Triglycerides 10/26/2015 324.0* 0.0 - 149.0 mg/dL Final   Normal:  <150 mg/dLBorderline High:  150 - 199 mg/dL  . HDL 10/26/2015 47.00  >39.00 mg/dL Final  . VLDL 10/26/2015 64.8* 0.0 - 40.0 mg/dL Final  . Total CHOL/HDL Ratio 10/26/2015 5   Final                  Men          Women1/2 Average Risk     3.4          3.3Average Risk          5.0           4.42X Average Risk          9.6          7.13X Average Risk          15.0          11.0                      . NonHDL 10/26/2015 177.40   Final   NOTE:  Non-HDL goal should be 30 mg/dL higher than patient's LDL goal (i.e. LDL goal of < 70 mg/dL, would have non-HDL goal of < 100 mg/dL)  . Direct LDL 10/26/2015 98.0   Final   Optimal:  <100 mg/dLNear or Above Optimal:  100-129 mg/dLBorderline High:  130-159 mg/dLHigh:  160-189 mg/dLVery High:  >190 mg/dL      Medication List       This list is accurate as of: 10/29/15  8:55 PM.  Always use your most recent med list.               acetaminophen 500 MG tablet  Commonly known as:  TYLENOL  Take 500 mg by mouth as needed. pain     amLODipine 10 MG tablet  Commonly known as:  NORVASC  Take 1 tablet (10 mg total) by mouth daily.     aspirin 81 MG EC tablet  Take 81 mg by mouth daily.     Azelastine HCl 0.15 % Soln  Use one spray in each nostril twice daily.     cabergoline 0.5 MG tablet  Commonly known as:  DOSTINEX  Take one half-tablet by mouth twice a week (on Monday and Saturday)     CIALIS 5 MG tablet  Generic drug:  tadalafil  TAKE ONE TABLET BY MOUTH DAILY AS NEEDED FOR  ERECTILE  DYSFUNCTION     doxazosin 2 MG tablet  Commonly known as:  CARDURA  TAKE ONE TABLET BY MOUTH AT BEDTIME     fluticasone 50 MCG/ACT nasal spray  Commonly  known as:  FLONASE  Place 1 spray into both nostrils 2 (two) times daily.     glimepiride 4 MG tablet  Commonly known as:  AMARYL  TAKE TWO TABLETS BY MOUTH ONCE DAILY     glucose blood test strip  Commonly known as:  ONE TOUCH ULTRA TEST  Use as instructed to check blood sugar 3 times daily E11.65     Liraglutide 18 MG/3ML Sopn  Commonly known as:  VICTOZA  INJECT 1.2 MG DAILY     montelukast 10 MG tablet  Commonly known as:  SINGULAIR  Take 1 tablet (10 mg total) by mouth daily.     pioglitazone-metformin 15-850 MG tablet  Commonly known as:  ACTOPLUS MET  Take 1 tablet  by mouth 2 (two) times daily with a meal.     potassium chloride 10 MEQ tablet  Commonly known as:  KLOR-CON M10  Take 1 tablet (10 mEq total) by mouth daily.     sildenafil 50 MG tablet  Commonly known as:  VIAGRA  Take 1 tablet (50 mg total) by mouth as needed for erectile dysfunction.     valsartan-hydrochlorothiazide 320-12.5 MG tablet  Commonly known as:  DIOVAN-HCT  TAKE ONE TABLET BY MOUTH ONCE DAILY     Vitamin D-3 5000 UNITS Tabs  Take 1 capsule by mouth at bedtime.        Allergies:  Allergies  Allergen Reactions  . Atorvastatin Itching  . Dextran Rash    Other Reaction: Not Assessed    Past Medical History  Diagnosis Date  . Hypertension   . Diabetes mellitus   . Hyperlipemia     Past Surgical History  Procedure Laterality Date  . Joint replacement      Family History  Problem Relation Age of Onset  . Cancer Father   . Diabetes Brother     Social History:  reports that he has never smoked. He does not have any smokeless tobacco history on file. He reports that he does not drink alcohol or use illicit drugs.  Review of Systems:   Hypertension:  BP at home not checked   Currently on a regimen of Diovan HCTZ and Norvasc  He ran out of potassium at some point and is potassium level is low.  He does not understand the importance of continuing this long-term with his HCTZ  Lab Results  Component Value Date   CREATININE 0.97 10/26/2015   BUN 13 10/26/2015   NA 134* 10/26/2015   K 3.2* 10/26/2015   CL 98 10/26/2015   CO2 28 10/26/2015     Lipids: He had been on Crestor and simvastatin and these were stopped because of increased liver functions  Nonfasting triglycerides are high but LDL is below 100  Lab Results  Component Value Date   CHOL 224* 10/26/2015   HDL 47.00 10/26/2015   LDLCALC 91 05/15/2014   LDLDIRECT 98.0 10/26/2015   TRIG 324.0* 10/26/2015   CHOLHDL 5 10/26/2015    Abnormal liver functions:  He has had mild increase in  liver functions, possibly from fatty liver although his ultrasound was normal in 2011.  He has not had any alcohol intake, nonsteroidal anti-inflammatory drugs Liver functions are consistently normal with stopping his simvastatin  Lab Results  Component Value Date   ALT 24 10/26/2015     He has had a 4x 5-mm  prolactinoma since 2007 with baseline prolactin level of 101, treated with Dostinex .  He has had better libido with  using Dostinex.  Last prolactin level was normal at 15.  He thinks that he will occasionally miss his medication  Testosterone level normal in 2016  Lab Results  Component Value Date   TESTOSTERONE 435.42 05/15/2014   Erectile dysfunction treated with Cialis   Examination:   BP 146/74 mmHg  Pulse 92  Ht 5' 9"  (1.753 m)  Wt 212 lb (96.163 kg)  BMI 31.29 kg/m2  SpO2 96%  Body mass index is 31.29 kg/(m^2).      ASSESSMENT/ PLAN:   Diabetes type 2  See history of present illness for detailed discussion of current diabetes management, blood sugar patterns and problems identified He is being followed up after several months interval now His diabetes is not as well controlled with A1c 7.4 and generally A1c over 7% Most of his recent high reading may be related to running out of his medications including Victoza He has gained weight and also has been taking only 0.6 mg Victoza instead of 0.9 He can do better with more consistent diet and exercise also Again not clear how often he is checking his blood sugar and does not bring his monitor for review Does also need to be followed more regularly  Liver function abnormalities: Resolved.  If he needs a statin drug may consider Lescol XL  Hypertension: blood pressure is usually controlled, slightly higher today Recommended checking BP regularly at home Discussed blood pressure targets May improve with regular exercise and weight loss  HYPERLIPIDEMIA: Not on any statin drug.  Nonfasting triglycerides are high  and will need to be checked again fasting Discussed importance of lipid control including triglycerides Discussed avoiding large carbohydrate or high-fat meals  Hypokalemia: He needs to start back on potassium supplements and take consistently  FATIGUE and insomnia: He will discuss with PCP.  Will also check TSH  Counseling time on subjects discussed above is over 50% of today's 25 minute visit    Patient Instructions  Restart potassium  If sugars stay >735 go up 5 clicks aboive 0.6 on Victoza  Check blood sugars on waking up 2  times a week Also check blood sugars about 2 hours after a meal and do this after different meals by rotation  Recommended blood sugar levels on waking up is 90-130 and about 2 hours after meal is 130-160  Please bring your blood sugar monitor to each visit, thank you      [facility] 10/29/2015, 8:55 PM

## 2015-10-29 NOTE — Patient Instructions (Signed)
Restart potassium  If sugars stay 0000000 go up 5 clicks aboive 0.6 on Victoza  Check blood sugars on waking up 2  times a week Also check blood sugars about 2 hours after a meal and do this after different meals by rotation  Recommended blood sugar levels on waking up is 90-130 and about 2 hours after meal is 130-160  Please bring your blood sugar monitor to each visit, thank you

## 2015-11-18 ENCOUNTER — Other Ambulatory Visit: Payer: Self-pay | Admitting: *Deleted

## 2015-11-18 MED ORDER — MONTELUKAST SODIUM 10 MG PO TABS
10.0000 mg | ORAL_TABLET | Freq: Every day | ORAL | 1 refills | Status: DC
Start: 2015-11-18 — End: 2016-08-31

## 2015-11-24 ENCOUNTER — Telehealth: Payer: Self-pay | Admitting: *Deleted

## 2015-11-24 MED ORDER — PIOGLITAZONE HCL-METFORMIN HCL 15-850 MG PO TABS: 1.0000 | ORAL_TABLET | Freq: Two times a day (BID) | ORAL | 1 refills | Status: DC

## 2015-11-24 NOTE — Telephone Encounter (Signed)
Received fax requesting 90 day supply of actoplusmet 15/850 1 tablet twice a day. Refill sent.

## 2015-11-27 ENCOUNTER — Other Ambulatory Visit: Payer: Self-pay

## 2015-11-27 MED ORDER — PIOGLITAZONE HCL-METFORMIN HCL 15-850 MG PO TABS: 1.0000 | ORAL_TABLET | Freq: Two times a day (BID) | ORAL | 1 refills | Status: DC

## 2015-11-30 ENCOUNTER — Ambulatory Visit: Payer: 59 | Admitting: Allergy and Immunology

## 2015-11-30 ENCOUNTER — Ambulatory Visit: Payer: 59 | Admitting: Allergy

## 2015-12-18 ENCOUNTER — Ambulatory Visit
Admission: RE | Admit: 2015-12-18 | Discharge: 2015-12-18 | Disposition: A | Discharge status: Home / Self Care | Payer: 59 | Source: Ambulatory Visit | Attending: Internal Medicine | Admitting: Internal Medicine

## 2015-12-18 DIAGNOSIS — R1084 Generalized abdominal pain: Secondary | ICD-10-CM

## 2015-12-18 MED ORDER — IOPAMIDOL (ISOVUE-300) INJECTION 61%
125.0000 mL | Freq: Once | INTRAVENOUS | Status: AC | PRN
  Administered 2015-12-18: 125 mL via INTRAVENOUS

## 2015-12-24 ENCOUNTER — Telehealth: Payer: Self-pay | Admitting: Endocrinology

## 2015-12-24 NOTE — Telephone Encounter (Signed)
pioglitazone-metformin (ACTOPLUS MET) 15-850 MG tablet 180 tablet 1   [id number] [place] [address] [phone](Phone) [phone](Fax)   90 day supply

## 2015-12-25 ENCOUNTER — Other Ambulatory Visit: Payer: 59

## 2015-12-29 ENCOUNTER — Other Ambulatory Visit: Payer: 59

## 2015-12-30 ENCOUNTER — Ambulatory Visit: Payer: 59 | Admitting: Endocrinology

## 2015-12-31 ENCOUNTER — Other Ambulatory Visit (INDEPENDENT_AMBULATORY_CARE_PROVIDER_SITE_OTHER): Payer: 59

## 2015-12-31 ENCOUNTER — Ambulatory Visit: Payer: 59 | Admitting: Endocrinology

## 2015-12-31 DIAGNOSIS — E782 Mixed hyperlipidemia: Secondary | ICD-10-CM

## 2015-12-31 DIAGNOSIS — E1165 Type 2 diabetes mellitus with hyperglycemia: Secondary | ICD-10-CM

## 2015-12-31 DIAGNOSIS — D352 Benign neoplasm of pituitary gland: Secondary | ICD-10-CM | POA: Diagnosis not present

## 2015-12-31 LAB — LIPID PANEL
CHOL/HDL RATIO: 4
Cholesterol: 212 mg/dL — ABNORMAL HIGH (ref 0–200)
HDL: 48.7 mg/dL (ref >39.00)
NONHDL: 163.6
TRIGLYCERIDES: 233 mg/dL — AB (ref 0.0–149.0)
VLDL: 46.6 mg/dL — ABNORMAL HIGH (ref 0.0–40.0)

## 2015-12-31 LAB — COMPREHENSIVE METABOLIC PANEL
ALT: 23 U/L (ref 0–53)
AST: 22 U/L (ref 0–37)
Albumin: 3.9 g/dL (ref 3.5–5.2)
Alkaline Phosphatase: 55 U/L (ref 39–117)
BILIRUBIN TOTAL: 0.5 mg/dL (ref 0.2–1.2)
BUN: 13 mg/dL (ref 6–23)
CALCIUM: 9.3 mg/dL (ref 8.4–10.5)
CHLORIDE: 102 meq/L (ref 96–112)
CO2: 32 meq/L (ref 19–32)
CREATININE: 1.04 mg/dL (ref 0.40–1.50)
GFR: 90.89 mL/min (ref >60.00)
GLUCOSE: 105 mg/dL — AB (ref 70–99)
Potassium: 3.9 mEq/L (ref 3.5–5.1)
SODIUM: 139 meq/L (ref 135–145)
Total Protein: 7.5 g/dL (ref 6.0–8.3)

## 2015-12-31 LAB — TSH: TSH: 2.04 u[IU]/mL (ref 0.35–4.50)

## 2015-12-31 LAB — LDL CHOLESTEROL, DIRECT: Direct LDL: 118 mg/dL

## 2016-01-01 LAB — PROLACTIN: Prolactin: 13.1 ng/mL (ref 4.0–15.2)

## 2016-01-05 ENCOUNTER — Encounter: Payer: Self-pay | Admitting: *Deleted

## 2016-01-05 ENCOUNTER — Ambulatory Visit: Payer: 59 | Admitting: Endocrinology

## 2016-01-05 DIAGNOSIS — Z0289 Encounter for other administrative examinations: Secondary | ICD-10-CM

## 2016-04-19 ENCOUNTER — Other Ambulatory Visit: Payer: Self-pay | Admitting: Endocrinology

## 2016-04-19 MED ORDER — AMLODIPINE BESYLATE 10 MG PO TABS: 10.0000 mg | ORAL_TABLET | Freq: Every day | ORAL | 2 refills | Status: DC

## 2016-05-02 ENCOUNTER — Ambulatory Visit: Payer: 59 | Admitting: Endocrinology

## 2016-05-02 ENCOUNTER — Other Ambulatory Visit: Payer: Self-pay

## 2016-05-02 MED ORDER — AMLODIPINE BESYLATE 10 MG PO TABS: 10.0000 mg | ORAL_TABLET | Freq: Every day | ORAL | 2 refills | Status: DC

## 2016-05-19 ENCOUNTER — Other Ambulatory Visit: Payer: 59

## 2016-05-19 ENCOUNTER — Encounter: Payer: 59 | Admitting: Endocrinology

## 2016-05-19 NOTE — Progress Notes (Signed)
This encounter was created in error - please disregard.

## 2016-05-20 ENCOUNTER — Other Ambulatory Visit: Payer: Self-pay | Admitting: Endocrinology

## 2016-06-06 ENCOUNTER — Other Ambulatory Visit: Payer: Self-pay

## 2016-06-06 MED ORDER — TADALAFIL 5 MG PO TABS: ORAL_TABLET | ORAL | 0 refills | Status: DC

## 2016-06-08 ENCOUNTER — Other Ambulatory Visit: Payer: Self-pay

## 2016-06-08 MED ORDER — TADALAFIL 5 MG PO TABS: ORAL_TABLET | ORAL | 0 refills | Status: DC

## 2016-06-10 ENCOUNTER — Other Ambulatory Visit (INDEPENDENT_AMBULATORY_CARE_PROVIDER_SITE_OTHER): Payer: 59

## 2016-06-10 ENCOUNTER — Other Ambulatory Visit: Payer: Self-pay

## 2016-06-10 ENCOUNTER — Telehealth: Payer: Self-pay | Admitting: Endocrinology

## 2016-06-10 DIAGNOSIS — D352 Benign neoplasm of pituitary gland: Secondary | ICD-10-CM

## 2016-06-10 DIAGNOSIS — E1165 Type 2 diabetes mellitus with hyperglycemia: Secondary | ICD-10-CM | POA: Diagnosis not present

## 2016-06-10 LAB — COMPREHENSIVE METABOLIC PANEL
ALBUMIN: 3.9 g/dL (ref 3.5–5.2)
ALT: 29 U/L (ref 0–53)
AST: 33 U/L (ref 0–37)
Alkaline Phosphatase: 58 U/L (ref 39–117)
BUN: 13 mg/dL (ref 6–23)
CALCIUM: 9.5 mg/dL (ref 8.4–10.5)
CHLORIDE: 100 meq/L (ref 96–112)
CO2: 30 mEq/L (ref 19–32)
CREATININE: 1.06 mg/dL (ref 0.40–1.50)
GFR: 88.8 mL/min (ref >60.00)
Glucose, Bld: 149 mg/dL — ABNORMAL HIGH (ref 70–99)
POTASSIUM: 3.9 meq/L (ref 3.5–5.1)
SODIUM: 138 meq/L (ref 135–145)
Total Bilirubin: 0.5 mg/dL (ref 0.2–1.2)
Total Protein: 7.1 g/dL (ref 6.0–8.3)

## 2016-06-10 LAB — HEMOGLOBIN A1C: Hgb A1c MFr Bld: 7.6 % — ABNORMAL HIGH (ref 4.6–6.5)

## 2016-06-10 LAB — TESTOSTERONE: TESTOSTERONE: 308.63 ng/dL (ref 300.00–890.00)

## 2016-06-10 LAB — LDL CHOLESTEROL, DIRECT: LDL DIRECT: 102 mg/dL

## 2016-06-10 MED ORDER — POTASSIUM CHLORIDE CRYS ER 10 MEQ PO TBCR: 10.0000 meq | EXTENDED_RELEASE_TABLET | Freq: Every day | ORAL | 3 refills | Status: DC

## 2016-06-10 NOTE — Telephone Encounter (Signed)
Pt needs his Potassium Script refilled and sent to the [facility] on [address] for a 90 days supply.

## 2016-06-10 NOTE — Telephone Encounter (Signed)
Ordered

## 2016-06-11 LAB — PROLACTIN: Prolactin: 10.3 ng/mL (ref 4.0–15.2)

## 2016-06-14 ENCOUNTER — Other Ambulatory Visit: Payer: Self-pay

## 2016-06-14 MED ORDER — TADALAFIL 5 MG PO TABS: ORAL_TABLET | ORAL | 0 refills | Status: DC

## 2016-06-15 ENCOUNTER — Encounter: Payer: Self-pay | Admitting: Endocrinology

## 2016-06-15 ENCOUNTER — Other Ambulatory Visit: Payer: Self-pay

## 2016-06-15 ENCOUNTER — Ambulatory Visit (INDEPENDENT_AMBULATORY_CARE_PROVIDER_SITE_OTHER): Payer: 59 | Admitting: Endocrinology

## 2016-06-15 VITALS — BP 140/80 | HR 87 | Wt 216.0 lb

## 2016-06-15 DIAGNOSIS — D352 Benign neoplasm of pituitary gland: Secondary | ICD-10-CM

## 2016-06-15 DIAGNOSIS — I10 Essential (primary) hypertension: Secondary | ICD-10-CM | POA: Diagnosis not present

## 2016-06-15 DIAGNOSIS — E782 Mixed hyperlipidemia: Secondary | ICD-10-CM

## 2016-06-15 DIAGNOSIS — E1165 Type 2 diabetes mellitus with hyperglycemia: Secondary | ICD-10-CM | POA: Diagnosis not present

## 2016-06-15 MED ORDER — DULAGLUTIDE 0.75 MG/0.5ML ~~LOC~~ SOAJ: SUBCUTANEOUS | 0 refills | Status: DC

## 2016-06-15 MED ORDER — GLUCOSE BLOOD VI STRP: ORAL_STRIP | 5 refills | Status: DC

## 2016-06-15 NOTE — Patient Instructions (Signed)
Check blood sugars on waking up 2-3 x per week   Also check blood sugars about 2 hours after a meal and do this after different meals by rotation  Recommended blood sugar levels on waking up is 90-130 and about 2 hours after meal is 130-160  Please bring your blood sugar monitor to each visit, thank you  Start TRULICITYwith the pen as shown once weekly on the same day of the week.  You may inject in the stomach, thigh or arm as indicated in the brochure given.   You will feel fullness of the stomach with starting the medication and should try to keep the portions at meals small.   You may experience nausea in the first few days which usually gets better over time   If any questions or concerns are present call the office or the  [facility] at [phone]. Also visit [email or web address] website for more useful information

## 2016-06-15 NOTE — Progress Notes (Signed)
Patient ID: Joshua Vargas, male   DOB: 15-Feb-1946, 71 y.o.   MRN: [id number]   Reason for Appointment: follow-up of various  problems  History of Present Illness   Type 2 DIABETES MELITUS, date of diagnosis: 2002      Previous history: He was initially treated with metformin alone and then subsequently was also given Actos and Amaryl.  He has had fairly good control but somewhat inconsistent because of difficulties with compliance with diet and exercise periodically. A1c previously has fluctuated between about 6.7-7.6 In 6/15 he was started on Victoza which he wanted to try because of difficulty losing weight and better control  Recent history:   Oral hypoglycemic drugs: Actoplusmet twice a day and Amaryl 75m in evening           He has not been seen in follow-up since 7/17  Current management, blood sugar patterns and problems identified:  His A1c has been mostly over 7% and now 7.6.  He as not taken Victoza again for a few months despite reminders on the last visit for him to restart  Does not bring his monitor and does not check his readings after meals usually.  He has not being motivated or compliant with exercise, he says he is busy working  He has gained a little weight.  He may not be controlling his portions and snacks as well without Victoza  No hypoglycemia with taking Amaryl, this is being taken in the evening     Side effects from medications:  anorexia with 1.2 mg Victoza       Monitors blood glucose: Sporadically   Glucometer:  One Touch.          Blood Glucose readings by recall:  Am 140-150 PC 160  Hypoglycemia, none           Physical activity: exercise:  Walking only occasionally              Dietician visit: Most recent: 21007          Complications: are: Erectile dysfunction   Wt Readings from Last 3 Encounters:  06/15/16 216 lb (98 kg)  10/29/15 212 lb (96.2 kg)  07/31/15 213 lb 13.5 oz (97 kg)   LABS:  Lab Results    Component Value Date   HGBA1C 7.6 (H) 06/10/2016   HGBA1C 7.4 (H) 10/26/2015   HGBA1C 6.5 06/01/2015   Lab Results  Component Value Date   MICROALBUR 5.8 (H) 10/26/2015   LDLCALC 91 05/15/2014   CREATININE 1.06 06/10/2016     OTHER problems are discussed in review of systems   LABS:  Lab on 06/10/2016  Component Date Value Ref Range Status  . Hgb A1c MFr Bld 06/10/2016 7.6* 4.6 - 6.5 % Final  . Sodium 06/10/2016 138  135 - 145 mEq/L Final  . Potassium 06/10/2016 3.9  3.5 - 5.1 mEq/L Final  . Chloride 06/10/2016 100  96 - 112 mEq/L Final  . CO2 06/10/2016 30  19 - 32 mEq/L Final  . Glucose, Bld 06/10/2016 149* 70 - 99 mg/dL Final  . BUN 06/10/2016 13  6 - 23 mg/dL Final  . Creatinine, Ser 06/10/2016 1.06  0.40 - 1.50 mg/dL Final  . Total Bilirubin 06/10/2016 0.5  0.2 - 1.2 mg/dL Final  . Alkaline Phosphatase 06/10/2016 58  39 - 117 U/L Final  . AST 06/10/2016 33  0 - 37 U/L Final  . ALT 06/10/2016 29  0 - 53 U/L Final  .  Total Protein 06/10/2016 7.1  6.0 - 8.3 g/dL Final  . Albumin 06/10/2016 3.9  3.5 - 5.2 g/dL Final  . Calcium 06/10/2016 9.5  8.4 - 10.5 mg/dL Final  . GFR 06/10/2016 88.80  >60.00 mL/min Final  . Testosterone 06/10/2016 308.63  300.00 - 890.00 ng/dL Final  . Prolactin 06/10/2016 10.3  4.0 - 15.2 ng/mL Final  . Direct LDL 06/10/2016 102.0  mg/dL Final    Allergies as of 06/15/2016      Reactions   Atorvastatin Itching   Dextran Rash   Other Reaction: Not Assessed      Medication List       Accurate as of 06/15/16  8:27 PM. Always use your most recent med list.          acetaminophen 500 MG tablet Commonly known as:  TYLENOL Take 500 mg by mouth as needed. pain   amLODipine 10 MG tablet Commonly known as:  NORVASC Take 1 tablet (10 mg total) by mouth daily.   aspirin 81 MG EC tablet Take 81 mg by mouth daily.   Azelastine HCl 0.15 % Soln Use one spray in each nostril twice daily.   cabergoline 0.5 MG tablet Commonly known as:   DOSTINEX Take one half-tablet by mouth twice a week (on Monday and Saturday)   doxazosin 2 MG tablet Commonly known as:  CARDURA TAKE ONE TABLET BY MOUTH AT BEDTIME   Dulaglutide 0.75 MG/0.5ML Sopn Commonly known as:  TRULICITY Inject in the abdominal skin as directed once a week   fluticasone 50 MCG/ACT nasal spray Commonly known as:  FLONASE Place 1 spray into both nostrils 2 (two) times daily.   glimepiride 4 MG tablet Commonly known as:  AMARYL TAKE TWO TABLETS BY MOUTH ONCE DAILY   glucose blood test strip Commonly known as:  ONE TOUCH ULTRA TEST Use as instructed to check blood sugar 1 times daily E11.65   montelukast 10 MG tablet Commonly known as:  SINGULAIR Take 1 tablet (10 mg total) by mouth daily.   pioglitazone-metformin 15-850 MG tablet Commonly known as:  ACTOPLUS MET Take 1 tablet by mouth 2 (two) times daily with a meal.   potassium chloride 10 MEQ tablet Commonly known as:  KLOR-CON M10 Take 1 tablet (10 mEq total) by mouth daily.   sildenafil 50 MG tablet Commonly known as:  VIAGRA Take 1 tablet (50 mg total) by mouth as needed for erectile dysfunction.   tadalafil 5 MG tablet Commonly known as:  CIALIS TAKE ONE TABLET BY MOUTH DAILY AS NEEDED FOR  ERECTILE  DYSFUNCTION   valsartan-hydrochlorothiazide 320-12.5 MG tablet Commonly known as:  DIOVAN-HCT TAKE ONE TABLET BY MOUTH ONCE DAILY   Vitamin D-3 5000 units Tabs Take 1 capsule by mouth at bedtime.       Allergies:  Allergies  Allergen Reactions  . Atorvastatin Itching  . Dextran Rash    Other Reaction: Not Assessed    Past Medical History:  Diagnosis Date  . Diabetes mellitus   . Hyperlipemia   . Hypertension     Past Surgical History:  Procedure Laterality Date  . JOINT REPLACEMENT      Family History  Problem Relation Age of Onset  . Cancer Father   . Diabetes Brother     Social History:  reports that he has never smoked. He has never used smokeless tobacco. He  reports that he does not drink alcohol or use drugs.  Review of Systems:   Hypertension:  BP at  home Recently 130/80-84  Currently on a regimen of Diovan 320/12.5, HCTZ, doxazosin and Norvasc Potassium is back to normal with taking the supplement   Lab Results  Component Value Date   CREATININE 1.06 06/10/2016   BUN 13 06/10/2016   NA 138 06/10/2016   K 3.9 06/10/2016   CL 100 06/10/2016   CO2 30 06/10/2016     Lipids: He had been on Crestor and simvastatin and these were stopped because of increased liver functions No statins have been prescribed since then  Nonfasting triglycerides are high but LDL  which was under 100 is relatively higher now He thinks he has not been watching his diet consistently   Lab Results  Component Value Date   CHOL 212 (H) 12/31/2015   HDL 48.70 12/31/2015   LDLCALC 91 05/15/2014   LDLDIRECT 102.0 06/10/2016   TRIG 233.0 (H) 12/31/2015   CHOLHDL 4 12/31/2015    Abnormal liver functions:  He has had mild increase in liver functions, possibly from fatty liver although his ultrasound was normal in 2011.  He has not had any alcohol intake, nonsteroidal anti-inflammatory drugs Liver functions are consistently normal with stopping his simvastatin  Lab Results  Component Value Date   ALT 29 06/10/2016     He has had a 4x 5-mm  prolactinoma since 2007 with baseline prolactin level of 101, treated with Dostinex .  He has had better libido with using Dostinex Half tablet twice a week.  His prolactin is again consistently normal   Testosterone level normal    Lab Results  Component Value Date   TESTOSTERONE 308.63 06/10/2016   Erectile dysfunction treated with Cialis   Examination:   BP 140/80 (BP Location: Left Arm, Patient Position: Sitting)   Pulse 87   Wt 216 lb (98 kg)   SpO2 98%   BMI 31.90 kg/m   Body mass index is 31.9 kg/m.     Diabetic Foot Exam - Simple   Simple Foot Form Diabetic Foot exam was performed with the  following findings:  Yes   Visual Inspection No deformities, no ulcerations, no other skin breakdown bilaterally:  Yes Sensation Testing Intact to touch and monofilament testing bilaterally:  Yes Pulse Check Posterior Tibialis and Dorsalis pulse intact bilaterally:  Yes Comments      ASSESSMENT/ PLAN:   Diabetes type 2  See history of present illness for detailed discussion of current diabetes management, blood sugar patterns and problems identified  He has been irregular with his follow-up Also not taking Victoza again as discussed in the last visit Although his blood sugar reportedly on 140 fasting at home he has an average estimated by A1c of about 170 Has not had consistent diet or exercise program Has gained a little weight also  Recommendations:  Need to cut back on portions and high-fat snacks and carbohydrates  Consultation with dietitian.  He needs to check more readings after meals especially after supper, reminded him to bring his meter for download, test strips are prescribed again today  Start more walking regularly Discussed with the patient the nature of GLP-1 drugs, the action on various organ systems, how they benefit blood glucose control, as well as the benefit of weight loss and  increase satiety . Explained possible side effects, particularly nausea and vomiting that usually resolve over time; discussed safety information in package insert. Demonstrated the medication injection device and injection technique to the patient.  Showed patient where to inject the medication. To start with 0.75  mg dosage weekly for the first 4 weeks Patient brochure on Trulicity with enclosed co-pay card given  Discussed that this may be more easy to comply with the daily injections   Follow-up in 1 month to assess blood sugar patterns  Liver function abnormalities: Resolved.   Hypertension: blood pressure is usually controlled, slightly higher  systolic today Recommended  checking BP regularly at home No change in medications at this time Also continue potassium supplements  HYPERLIPIDEMIA: Not on any statin drug.  Nonfasting triglycerides are high He may be able to do better again with reducing saturated fat as his LDL was below 100 previously and now is 102 Consider using Lescol XL if LDL status persistently high  PROLACTINOMA: His level is maintained in the mid normal range with Dostinex and will continue for now Testosterone level normal and he is not complaining of unusual fatigue  Counseling time on subjects discussed above is over 50% of today's 25 minute visit    Patient Instructions  Check blood sugars on waking up 2-3 x per week   Also check blood sugars about 2 hours after a meal and do this after different meals by rotation  Recommended blood sugar levels on waking up is 90-130 and about 2 hours after meal is 130-160  Please bring your blood sugar monitor to each visit, thank you  Start TRULICITYwith the pen as shown once weekly on the same day of the week.  You may inject in the stomach, thigh or arm as indicated in the brochure given.   You will feel fullness of the stomach with starting the medication and should try to keep the portions at meals small.   You may experience nausea in the first few days which usually gets better over time   If any questions or concerns are present call the office or the  [facility] at [phone]. Also visit [email or web address] website for more useful information        Garrit Marrow 06/15/2016, 8:27 PM

## 2016-06-23 ENCOUNTER — Other Ambulatory Visit: Payer: Self-pay

## 2016-06-24 ENCOUNTER — Other Ambulatory Visit: Payer: Self-pay

## 2016-07-07 ENCOUNTER — Encounter: Payer: 59 | Attending: Endocrinology | Admitting: Dietician

## 2016-07-07 ENCOUNTER — Encounter: Payer: Self-pay | Admitting: Dietician

## 2016-07-07 DIAGNOSIS — Z713 Dietary counseling and surveillance: Secondary | ICD-10-CM | POA: Insufficient documentation

## 2016-07-07 DIAGNOSIS — E118 Type 2 diabetes mellitus with unspecified complications: Secondary | ICD-10-CM

## 2016-07-07 DIAGNOSIS — E1165 Type 2 diabetes mellitus with hyperglycemia: Secondary | ICD-10-CM | POA: Insufficient documentation

## 2016-07-07 NOTE — Patient Instructions (Signed)
Stop the evening coffee. Work on other tips to help you have better quality and more sleep. Rethink your breakfast.   Be more active.  Aim for 30 minutes most days of the week. Avoid skipping meals.    Aim for 3 Carb Choices per meal (45 grams) +/- 1 either way  Aim for 0-1 Carbs per snack if hungry  Include protein in moderation with your meals and snacks Consider reading food labels for Total Carbohydrate and Fat Grams of foods Continuechecking BG at alternate times per day as directed by MD  Continuetaking medication as directed by MD

## 2016-07-07 NOTE — Progress Notes (Signed)
Diabetes Self-Management Education  Visit Type: First/Initial  Appt. Start Time: 1415 Appt. End Time: 1443  07/07/2016  [name], identified by name and date of birth, is a 71 y.o. male with a diagnosis of Diabetes: Type 2. Other hx includes hyperlipidemia, HTN, decreased appetite for the past 6 months.  He does not sleep well.  Sometimes will sleep in a recliner with the TV on, sometimes in bed, interrupted by bathroom.  Sleeps 4-5 hours most nights.  He drinks 5 cups coffee per day some of it in the evening. Medications include glimiperide, trulicity and actoplus He uses a [employer] meter.  Patient lives with his wife.  His wife shops and cooks.  She cooks a lot of plant based foods but patient does not prefer these.  He eats breakfast out most mornings and we identified that it was very high fat and high in carbohydrates most mornings and then he will gradually reduce carbohydrates throughout the day and skips dinner at times.  He is lactose intolerant.  He works as a Freight forwarder for [employer].  ASSESSMENT  Height 5\' 10"  (1.778 m), weight 215 lb (97.5 kg). Body mass index is 30.85 kg/m.      Diabetes Self-Management Education - 07/07/16 1431      Visit Information   Visit Type First/Initial     Initial Visit   Diabetes Type Type 2   Are you currently following a meal plan? No   Are you taking your medications as prescribed? Yes   Date Diagnosed 2002     Health Coping   How would you rate your overall health? Good     Psychosocial Assessment   Patient Belief/Attitude about Diabetes Motivated to manage diabetes   Self-care barriers None   Self-management support Doctor's office;Family   Other persons present Patient;Spouse/SO   Patient Concerns Nutrition/Meal planning;Glycemic Control   Special Needs None   Preferred Learning Style No preference indicated   Learning Readiness Ready   How often do you need to have someone help you when you read instructions, pamphlets,  or other written materials from your doctor or pharmacy? 1 - Never   What is the last grade level you completed in school? 4 years college     Pre-Education Assessment   Patient understands the diabetes disease and treatment process. Needs Review   Patient understands incorporating nutritional management into lifestyle. Needs Review   Patient undertands incorporating physical activity into lifestyle. Needs Review   Patient understands using medications safely. Needs Review   Patient understands monitoring blood glucose, interpreting and using results Needs Review   Patient understands prevention, detection, and treatment of acute complications. Needs Review   Patient understands prevention, detection, and treatment of chronic complications. Needs Review   Patient understands how to develop strategies to address psychosocial issues. Needs Review   Patient understands how to develop strategies to promote health/change behavior. Needs Review     Complications   Last HgB A1C per patient/outside source 7.6 %  06/10/13   How often do you check your blood sugar? 1-2 times/day  2-3 times daily   Fasting Blood glucose range (mg/dL) 70-129   Postprandial Blood glucose range (mg/dL) 130-179   Number of hypoglycemic episodes per month 2   Can you tell when your blood sugar is low? Yes   What do you do if your blood sugar is low? 15 grams fast acting carbohydrate   Number of hyperglycemic episodes per week 0   Have you had a  dilated eye exam in the past 12 months? Yes   Have you had a dental exam in the past 12 months? Yes   Are you checking your feet? Yes   How many days per week are you checking your feet? 7     Dietary Intake   Breakfast 2 ham biscuit with 2 packs strawberry jam from Splenda, 4 cups coffee with splenda  7:45   Snack (morning) none   Lunch veges, meat (small portions)    Snack (afternoon) none   Dinner coffee with splenda and NABS OR pinto's   Snack (evening) cookies    Beverage(s) 5 cups coffee with splenda per day, water, seldom (regular), unsweetened tea with splenda     Exercise   Exercise Type Light (walking / raking leaves)  mows own yard   How many days per week to you exercise? 1   How many minutes per day do you exercise? 1   Total minutes per week of exercise 1     Patient Education   Previous Diabetes Education Yes (please comment)  years ago   Disease state  Definition of diabetes, type 1 and 2, and the diagnosis of diabetes   Nutrition management  Role of diet in the treatment of diabetes and the relationship between the three main macronutrients and blood glucose level;Food label reading, portion sizes and measuring food.;Carbohydrate counting;Meal options for control of blood glucose level and chronic complications.;Information on hints to eating out and maintain blood glucose control.   Physical activity and exercise  Role of exercise on diabetes management, blood pressure control and cardiac health.   Monitoring Identified appropriate SMBG and/or A1C goals.;Purpose and frequency of SMBG.   Psychosocial adjustment Role of stress on diabetes;Worked with patient to identify barriers to care and solutions   Personal strategies to promote health Lifestyle issues that need to be addressed for better diabetes care     Individualized Goals (developed by patient)   Nutrition General guidelines for healthy choices and portions discussed   Physical Activity Exercise 5-7 days per week;30 minutes per day   Medications take my medication as prescribed   Monitoring  test my blood glucose as discussed   Problem Solving healthier options for breakfast   Reducing Risk examine blood glucose patterns;Other (comment)  increase non starchy vegetables   Health Coping discuss diabetes with (comment)  MD/RD     Post-Education Assessment   Patient understands the diabetes disease and treatment process. Demonstrates understanding / competency   Patient  understands incorporating nutritional management into lifestyle. Demonstrates understanding / competency   Patient undertands incorporating physical activity into lifestyle. Demonstrates understanding / competency   Patient understands using medications safely. Demonstrates understanding / competency   Patient understands monitoring blood glucose, interpreting and using results Demonstrates understanding / competency   Patient understands prevention, detection, and treatment of acute complications. Demonstrates understanding / competency   Patient understands prevention, detection, and treatment of chronic complications. Demonstrates understanding / competency   Patient understands how to develop strategies to address psychosocial issues. Demonstrates understanding / competency   Patient understands how to develop strategies to promote health/change behavior. Demonstrates understanding / competency     Outcomes   Expected Outcomes Demonstrated interest in learning. Expect positive outcomes   Future DMSE 2 months   Program Status Completed      Individualized Plan for Diabetes Self-Management Training:   Learning Objective:  Patient will have a greater understanding of diabetes self-management. Patient education plan is to attend  individual and/or group sessions per assessed needs and concerns.   Plan:   Patient Instructions  Stop the evening coffee. Work on other tips to help you have better quality and more sleep. Rethink your breakfast.   Be more active.  Aim for 30 minutes most days of the week. Avoid skipping meals.    Aim for 3 Carb Choices per meal (45 grams) +/- 1 either way  Aim for 0-1 Carbs per snack if hungry  Include protein in moderation with your meals and snacks Consider reading food labels for Total Carbohydrate and Fat Grams of foods Continuechecking BG at alternate times per day as directed by MD  Continuetaking medication as directed by MD      Expected  Outcomes:  Demonstrated interest in learning. Expect positive outcomes  Education material provided: Living Well with Diabetes, Food label handouts, A1C conversion sheet, Meal plan card, My Plate and Snack sheet, breakfast ideas, eating out tips. Also discussed Calorie [email or web address]   If problems or questions, patient to contact team via:  Phone and Email  Future DSME appointment: 2 months

## 2016-07-12 ENCOUNTER — Other Ambulatory Visit: Payer: 59

## 2016-07-13 ENCOUNTER — Other Ambulatory Visit (INDEPENDENT_AMBULATORY_CARE_PROVIDER_SITE_OTHER): Payer: 59

## 2016-07-13 DIAGNOSIS — E1165 Type 2 diabetes mellitus with hyperglycemia: Secondary | ICD-10-CM

## 2016-07-13 LAB — GLUCOSE, RANDOM: Glucose, Bld: 104 mg/dL — ABNORMAL HIGH (ref 70–99)

## 2016-07-14 LAB — FRUCTOSAMINE: FRUCTOSAMINE: 261 umol/L (ref 0–285)

## 2016-07-15 ENCOUNTER — Ambulatory Visit: Payer: 59 | Admitting: Endocrinology

## 2016-07-19 ENCOUNTER — Encounter: Payer: Self-pay | Admitting: Endocrinology

## 2016-07-19 ENCOUNTER — Ambulatory Visit (INDEPENDENT_AMBULATORY_CARE_PROVIDER_SITE_OTHER): Payer: 59 | Admitting: Endocrinology

## 2016-07-19 VITALS — BP 138/68 | HR 72 | Ht 70.0 in | Wt 216.0 lb

## 2016-07-19 DIAGNOSIS — E1165 Type 2 diabetes mellitus with hyperglycemia: Secondary | ICD-10-CM

## 2016-07-19 MED ORDER — DULAGLUTIDE 0.75 MG/0.5ML ~~LOC~~ SOAJ: SUBCUTANEOUS | 2 refills | Status: DC

## 2016-07-19 NOTE — Patient Instructions (Signed)
Glimeperide to 1/2

## 2016-07-19 NOTE — Progress Notes (Signed)
Patient ID: Joshua Vargas, male   DOB: 1946/02/02, 71 y.o.   MRN: [id number]   Reason for Appointment: follow-up of various  problems  History of Present Illness   Type 2 DIABETES MELITUS, date of diagnosis: 2002      Previous history: He was initially treated with metformin alone and then subsequently was also given Actos and Amaryl.  He has had fairly good control but somewhat inconsistent because of difficulties with compliance with diet and exercise periodically. A1c previously has fluctuated between about 6.7-7.6 In 6/15 he was started on Victoza which he wanted to try because of difficulty losing weight and better control  Recent history:   Non-insulin hypoglycemic drugs: Actoplusmet twice a day and Amaryl 93m in evening, Trulicity 00.38mg weekly           His A1c has been mostly over 7% and since it was 7.6 in March he was started on Trulicity  Current management, blood sugar patterns and problems identified:  He has had overall better blood sugars with starting Trulicity and has not had any tendency to significant nausea or anorexia, he may have had some tendency to anorexia with Victoza previously  He is using a new sugar monitor from his insurance and identify patterns on this  He has had a couple of documented low blood sugars and may be getting more tendency to low sugars before lunch and supper, has not reduced his Amaryl with starting Trulicity  He has able to cut back on portions somewhat but has not lost any weight     Side effects from medications:  anorexia with 1.2 mg Victoza       Monitors blood glucose: Sporadically   Glucometer:  One Touch.          Blood Glucose readings by review of his monitor:  AVERAGE for 30 days = 111 Blood sugar range 54-160 Has had hypoglycemia once before dinner and once after breakfast on his record Has only a couple of readings over 140, once after breakfast and once after afternoon snack         Physical activity:  exercise:  Walking some               Dietician visit: Most recent: 28828          Complications: are: Erectile dysfunction   Wt Readings from Last 3 Encounters:  07/19/16 216 lb (98 kg)  07/07/16 215 lb (97.5 kg)  06/15/16 216 lb (98 kg)   LABS:  Lab Results  Component Value Date   HGBA1C 7.6 (H) 06/10/2016   HGBA1C 7.4 (H) 10/26/2015   HGBA1C 6.5 06/01/2015   Lab Results  Component Value Date   MICROALBUR 5.8 (H) 10/26/2015   LDLCALC 91 05/15/2014   CREATININE 1.06 06/10/2016     OTHER problems are discussed in review of systems   LABS:  Lab on 07/13/2016  Component Date Value Ref Range Status  . Fructosamine 07/13/2016 261  0 - 285 umol/L Final   Comment: Published reference interval for apparently healthy subjects between age 7825and 631is 240- 285 umol/L and in a poorly controlled diabetic population is 228 - 563 umol/L with a mean of 396 umol/L.   .Marland KitchenGlucose, Bld 07/13/2016 104* 70 - 99 mg/dL Final    Allergies as of 07/19/2016      Reactions   Atorvastatin Itching   Dextran Rash   Other Reaction: Not Assessed  Medication List       Accurate as of 07/19/16  9:21 PM. Always use your most recent med list.          acetaminophen 500 MG tablet Commonly known as:  TYLENOL Take 500 mg by mouth as needed. pain   amLODipine 10 MG tablet Commonly known as:  NORVASC Take 1 tablet (10 mg total) by mouth daily.   aspirin 81 MG EC tablet Take 81 mg by mouth daily.   Azelastine HCl 0.15 % Soln Use one spray in each nostril twice daily.   cabergoline 0.5 MG tablet Commonly known as:  DOSTINEX Take one half-tablet by mouth twice a week (on Monday and Saturday)   doxazosin 2 MG tablet Commonly known as:  CARDURA TAKE ONE TABLET BY MOUTH AT BEDTIME   Dulaglutide 0.75 MG/0.5ML Sopn Commonly known as:  TRULICITY Inject in the abdominal skin as directed once a week   fluticasone 50 MCG/ACT nasal spray Commonly known as:  FLONASE Place 1 spray into  both nostrils 2 (two) times daily.   glimepiride 4 MG tablet Commonly known as:  AMARYL TAKE TWO TABLETS BY MOUTH ONCE DAILY   glucose blood test strip Commonly known as:  ONE TOUCH ULTRA TEST Use as instructed to check blood sugar 1 times daily E11.65   montelukast 10 MG tablet Commonly known as:  SINGULAIR Take 1 tablet (10 mg total) by mouth daily.   pioglitazone-metformin 15-850 MG tablet Commonly known as:  ACTOPLUS MET Take 1 tablet by mouth 2 (two) times daily with a meal.   potassium chloride 10 MEQ tablet Commonly known as:  KLOR-CON M10 Take 1 tablet (10 mEq total) by mouth daily.   sildenafil 50 MG tablet Commonly known as:  VIAGRA Take 1 tablet (50 mg total) by mouth as needed for erectile dysfunction.   tadalafil 5 MG tablet Commonly known as:  CIALIS TAKE ONE TABLET BY MOUTH DAILY AS NEEDED FOR  ERECTILE  DYSFUNCTION   valsartan-hydrochlorothiazide 320-12.5 MG tablet Commonly known as:  DIOVAN-HCT TAKE ONE TABLET BY MOUTH ONCE DAILY   Vitamin D-3 5000 units Tabs Take 1 capsule by mouth at bedtime.       Allergies:  Allergies  Allergen Reactions  . Atorvastatin Itching  . Dextran Rash    Other Reaction: Not Assessed    Past Medical History:  Diagnosis Date  . Diabetes mellitus   . Hyperlipemia   . Hypertension     Past Surgical History:  Procedure Laterality Date  . JOINT REPLACEMENT      Family History  Problem Relation Age of Onset  . Cancer Father   . Diabetes Brother     Social History:  reports that he has never smoked. He has never used smokeless tobacco. He reports that he does not drink alcohol or use drugs.  Review of Systems:   Hypertension:  BP at home upto 138/66   Currently on a regimen of Diovan 320/12.5, HCTZ, doxazosin and Norvasc Potassium is back to normal with taking the supplement   Lab Results  Component Value Date   CREATININE 1.06 06/10/2016   BUN 13 06/10/2016   NA 138 06/10/2016   K 3.9 06/10/2016     CL 100 06/10/2016   CO2 30 06/10/2016     Lipids: He had been on Crestor and simvastatin and these were stopped because of increased liver functions No statins have been prescribed since then  Nonfasting triglycerides are high but LDL  which was under 100  is relatively higher now  He thinks he has not been watching his diet consistently   Lab Results  Component Value Date   CHOL 212 (H) 12/31/2015   HDL 48.70 12/31/2015   LDLCALC 91 05/15/2014   LDLDIRECT 102.0 06/10/2016   TRIG 233.0 (H) 12/31/2015   CHOLHDL 4 12/31/2015    Abnormal liver functions:  He has had mild increase in liver functions, possibly from fatty liver although his ultrasound was normal in 2011.  He has not had any alcohol intake, nonsteroidal anti-inflammatory drugs Liver functions are consistently normal with stopping his simvastatin  Lab Results  Component Value Date   ALT 29 06/10/2016     He has had a 4x 5-mm  prolactinoma since 2007 with baseline prolactin level of 101, treated with Dostinex .  He has had better libido with using Dostinex Half tablet twice a week.  His prolactin is again consistently normal   Testosterone level normal    Lab Results  Component Value Date   TESTOSTERONE 308.63 06/10/2016   Erectile dysfunction treated with Cialis   Examination:   BP 138/68   Pulse 72   Ht 5' 10"  (1.778 m)   Wt 216 lb (98 kg)   BMI 30.99 kg/m   Body mass index is 30.99 kg/m.       ASSESSMENT/ PLAN:   Diabetes type 2  See history of present illness for detailed discussion of current diabetes management, blood sugar patterns and problems identified  With starting Trulicity his blood sugars are overall better and he is getting fairly normal sugars most of the time Fructosamine is fairly good at 261  With Amaryl 4 mg he is tending to get money hypoglycemic during the day also He has finally started checking his blood sugars more consistently and is doing them at various times  of the help of a new monitor However this cannot be downloaded and records reviewed from his printout So far has not lost any weight   Recommendations: Need to reduce Amaryl to 2 mg Continue Trulicity 9.19 mg weekly He will try to see what her insurance will cover as far as glucose monitors   Hypertension: blood pressure is controlled and reassured him that  that his diastolic being low is not a problem  HYPERLIPIDEMIA: Not on any statin drug.  We will discuss plans after repeat levels   Patient Instructions  Glimeperide to 1/2     Amarian Botero 07/19/2016, 9:21 PM

## 2016-07-25 LAB — HM DIABETES EYE EXAM

## 2016-08-16 ENCOUNTER — Other Ambulatory Visit: Payer: Self-pay

## 2016-08-16 MED ORDER — DULAGLUTIDE 0.75 MG/0.5ML ~~LOC~~ SOAJ: SUBCUTANEOUS | 2 refills | Status: DC

## 2016-08-18 ENCOUNTER — Ambulatory Visit: Payer: 59 | Admitting: Dietician

## 2016-08-31 ENCOUNTER — Other Ambulatory Visit: Payer: Self-pay | Admitting: Allergy and Immunology

## 2016-09-20 ENCOUNTER — Other Ambulatory Visit (INDEPENDENT_AMBULATORY_CARE_PROVIDER_SITE_OTHER): Payer: 59

## 2016-09-20 DIAGNOSIS — E1165 Type 2 diabetes mellitus with hyperglycemia: Secondary | ICD-10-CM

## 2016-09-20 LAB — COMPREHENSIVE METABOLIC PANEL
ALK PHOS: 59 U/L (ref 39–117)
ALT: 32 U/L (ref 0–53)
AST: 35 U/L (ref 0–37)
Albumin: 4.1 g/dL (ref 3.5–5.2)
BUN: 10 mg/dL (ref 6–23)
CALCIUM: 9.7 mg/dL (ref 8.4–10.5)
CO2: 31 meq/L (ref 19–32)
Chloride: 103 mEq/L (ref 96–112)
Creatinine, Ser: 1.03 mg/dL (ref 0.40–1.50)
GFR: 91.72 mL/min (ref >60.00)
Glucose, Bld: 110 mg/dL — ABNORMAL HIGH (ref 70–99)
Potassium: 4 mEq/L (ref 3.5–5.1)
Sodium: 139 mEq/L (ref 135–145)
Total Bilirubin: 0.4 mg/dL (ref 0.2–1.2)
Total Protein: 7.6 g/dL (ref 6.0–8.3)

## 2016-09-20 LAB — LIPID PANEL
CHOL/HDL RATIO: 4
Cholesterol: 191 mg/dL (ref 0–200)
HDL: 47.3 mg/dL (ref >39.00)
NonHDL: 143.21
TRIGLYCERIDES: 219 mg/dL — AB (ref 0.0–149.0)
VLDL: 43.8 mg/dL — AB (ref 0.0–40.0)

## 2016-09-20 LAB — MICROALBUMIN / CREATININE URINE RATIO
Creatinine,U: 141.1 mg/dL
Microalb Creat Ratio: 4.2 mg/g (ref 0.0–30.0)
Microalb, Ur: 5.9 mg/dL — ABNORMAL HIGH (ref 0.0–1.9)

## 2016-09-20 LAB — HEMOGLOBIN A1C: HEMOGLOBIN A1C: 6.5 % (ref 4.6–6.5)

## 2016-09-20 LAB — LDL CHOLESTEROL, DIRECT: Direct LDL: 83 mg/dL

## 2016-09-23 ENCOUNTER — Ambulatory Visit: Payer: 59 | Admitting: Endocrinology

## 2016-09-23 DIAGNOSIS — Z0289 Encounter for other administrative examinations: Secondary | ICD-10-CM

## 2016-09-29 NOTE — Progress Notes (Signed)
Please call to let patient know that we need to see him for follow-up

## 2016-11-07 ENCOUNTER — Encounter: Payer: Self-pay | Admitting: Endocrinology

## 2016-11-07 ENCOUNTER — Ambulatory Visit (INDEPENDENT_AMBULATORY_CARE_PROVIDER_SITE_OTHER): Payer: 59 | Admitting: Endocrinology

## 2016-11-07 VITALS — BP 142/78 | HR 83 | Ht 70.0 in | Wt 207.8 lb

## 2016-11-07 DIAGNOSIS — D352 Benign neoplasm of pituitary gland: Secondary | ICD-10-CM | POA: Diagnosis not present

## 2016-11-07 DIAGNOSIS — E782 Mixed hyperlipidemia: Secondary | ICD-10-CM | POA: Diagnosis not present

## 2016-11-07 DIAGNOSIS — I10 Essential (primary) hypertension: Secondary | ICD-10-CM | POA: Diagnosis not present

## 2016-11-07 DIAGNOSIS — E1165 Type 2 diabetes mellitus with hyperglycemia: Secondary | ICD-10-CM

## 2016-11-07 MED ORDER — OLMESARTAN MEDOXOMIL-HCTZ 40-12.5 MG PO TABS: 1.0000 | ORAL_TABLET | Freq: Every day | ORAL | 1 refills | Status: DC

## 2016-11-07 NOTE — Progress Notes (Signed)
Patient ID: Joshua Vargas, male   DOB: 06-06-1945, 71 y.o.   MRN: [id number]   Reason for Appointment: follow-up of various  problems  History of Present Illness   Type 2 DIABETES MELITUS, date of diagnosis: 2002      Previous history: He was initially treated with metformin alone and then subsequently was also given Actos and Amaryl.  He has had fairly good control but somewhat inconsistent because of difficulties with compliance with diet and exercise periodically. A1c previously has fluctuated between about 6.7-7.6 In 6/15 he was started on Victoza which he wanted to try because of difficulty losing weight and better control  Recent history:   Non-insulin hypoglycemic drugs: Actoplusmet once a day and Amaryl 99m in evening, Trulicity 07.82mg weekly           His A1c has been mostly over 7% previously and now 6.5%  Current management, blood sugar patterns and problems identified:  He has had overall better blood sugars with starting Trulicity and also seeing the dietitian earlier this year  He has lost a significant amount of weight  He has been unable to cut back on portions with Trulicity and not having any significant nausea with this and he is encouraged by this and was to continue losing weight.  He thinks he is walking up to a mile a day both at work and home but not at consistent program  He says that he was tending to have some feelings of hypoglycemia during the day and he cut back his Actoplusmet to once a day only  However still taking 4 mg Amaryl in the evening  Again did not bring his monitor for download but he thinks his blood sugars are all under 120 even after meals     Side effects from medications:  anorexia with 1.2 mg Victoza       Monitors blood glucose: Sporadically   Glucometer:  One Touch.          Blood Glucose readings by recall fasting: 103-110 <120 pc  Physical activity: exercise:  Walking some more              Dietician visit:  Most recent: 39/5621           Complications: are: Erectile dysfunction   Wt Readings from Last 3 Encounters:  11/07/16 207 lb 12.8 oz (94.3 kg)  07/19/16 216 lb (98 kg)  07/07/16 215 lb (97.5 kg)   LABS:  Lab Results  Component Value Date   HGBA1C 6.5 09/20/2016   HGBA1C 7.6 (H) 06/10/2016   HGBA1C 7.4 (H) 10/26/2015   Lab Results  Component Value Date   MICROALBUR 5.9 (H) 09/20/2016   LDLCALC 91 05/15/2014   CREATININE 1.03 09/20/2016     OTHER problems are discussed in review of systems   LABS:  No visits with results within 1 Week(s) from this visit.  Latest known visit with results is:  Lab on 09/20/2016  Component Date Value Ref Range Status  . Hgb A1c MFr Bld 09/20/2016 6.5  4.6 - 6.5 % Final   Glycemic Control Guidelines for People with Diabetes:Non Diabetic:  <6%Goal of Therapy: <7%Additional Action Suggested:  >8%   . Sodium 09/20/2016 139  135 - 145 mEq/L Final  . Potassium 09/20/2016 4.0  3.5 - 5.1 mEq/L Final  . Chloride 09/20/2016 103  96 - 112 mEq/L Final  . CO2 09/20/2016 31  19 - 32 mEq/L Final  . Glucose, Bld  09/20/2016 110* 70 - 99 mg/dL Final  . BUN 09/20/2016 10  6 - 23 mg/dL Final  . Creatinine, Ser 09/20/2016 1.03  0.40 - 1.50 mg/dL Final  . Total Bilirubin 09/20/2016 0.4  0.2 - 1.2 mg/dL Final  . Alkaline Phosphatase 09/20/2016 59  39 - 117 U/L Final  . AST 09/20/2016 35  0 - 37 U/L Final  . ALT 09/20/2016 32  0 - 53 U/L Final  . Total Protein 09/20/2016 7.6  6.0 - 8.3 g/dL Final  . Albumin 09/20/2016 4.1  3.5 - 5.2 g/dL Final  . Calcium 09/20/2016 9.7  8.4 - 10.5 mg/dL Final  . GFR 09/20/2016 91.72  >60.00 mL/min Final  . Cholesterol 09/20/2016 191  0 - 200 mg/dL Final   ATP III Classification       Desirable:  < 200 mg/dL               Borderline High:  200 - 239 mg/dL          High:  > = 240 mg/dL  . Triglycerides 09/20/2016 219.0* 0.0 - 149.0 mg/dL Final   Normal:  <150 mg/dLBorderline High:  150 - 199 mg/dL  . HDL 09/20/2016 47.30   >39.00 mg/dL Final  . VLDL 09/20/2016 43.8* 0.0 - 40.0 mg/dL Final  . Total CHOL/HDL Ratio 09/20/2016 4   Final                  Men          Women1/2 Average Risk     3.4          3.3Average Risk          5.0          4.42X Average Risk          9.6          7.13X Average Risk          15.0          11.0                      . NonHDL 09/20/2016 143.21   Final   NOTE:  Non-HDL goal should be 30 mg/dL higher than patient's LDL goal (i.e. LDL goal of < 70 mg/dL, would have non-HDL goal of < 100 mg/dL)  . Microalb, Ur 09/20/2016 5.9* 0.0 - 1.9 mg/dL Final  . Creatinine,U 09/20/2016 141.1  mg/dL Final  . Microalb Creat Ratio 09/20/2016 4.2  0.0 - 30.0 mg/g Final  . Direct LDL 09/20/2016 83.0  mg/dL Final   Optimal:  <100 mg/dLNear or Above Optimal:  100-129 mg/dLBorderline High:  130-159 mg/dLHigh:  160-189 mg/dLVery High:  >190 mg/dL    Allergies as of 11/07/2016      Reactions   Atorvastatin Itching   Dextran Rash   Other Reaction: Not Assessed      Medication List       Accurate as of 11/07/16  9:44 PM. Always use your most recent med list.          acetaminophen 500 MG tablet Commonly known as:  TYLENOL Take 500 mg by mouth as needed. pain   amLODipine 10 MG tablet Commonly known as:  NORVASC Take 1 tablet (10 mg total) by mouth daily.   aspirin 81 MG EC tablet Take 81 mg by mouth daily.   Azelastine HCl 0.15 % Soln Use one spray in each nostril twice daily.   cabergoline 0.5 MG tablet  Commonly known as:  DOSTINEX Take one half-tablet by mouth twice a week (on Monday and Saturday)   doxazosin 2 MG tablet Commonly known as:  CARDURA TAKE ONE TABLET BY MOUTH AT BEDTIME   Dulaglutide 0.75 MG/0.5ML Sopn Commonly known as:  TRULICITY Inject in the abdominal skin as directed once a week   fluticasone 50 MCG/ACT nasal spray Commonly known as:  FLONASE Place 1 spray into both nostrils 2 (two) times daily.   glimepiride 4 MG tablet Commonly known as:  AMARYL TAKE TWO  TABLETS BY MOUTH ONCE DAILY   glucose blood test strip Commonly known as:  ONE TOUCH ULTRA TEST Use as instructed to check blood sugar 1 times daily E11.65   montelukast 10 MG tablet Commonly known as:  SINGULAIR TAKE 1 TABLET BY MOUTH EVERY DAY   olmesartan-hydrochlorothiazide 40-12.5 MG tablet Commonly known as:  BENICAR HCT Take 1 tablet by mouth daily.   pioglitazone-metformin 15-850 MG tablet Commonly known as:  ACTOPLUS MET Take 1 tablet by mouth 2 (two) times daily with a meal.   potassium chloride 10 MEQ tablet Commonly known as:  KLOR-CON M10 Take 1 tablet (10 mEq total) by mouth daily.   sildenafil 50 MG tablet Commonly known as:  VIAGRA Take 1 tablet (50 mg total) by mouth as needed for erectile dysfunction.   tadalafil 5 MG tablet Commonly known as:  CIALIS TAKE ONE TABLET BY MOUTH DAILY AS NEEDED FOR  ERECTILE  DYSFUNCTION   Vitamin D-3 5000 units Tabs Take 1 capsule by mouth at bedtime.       Allergies:  Allergies  Allergen Reactions  . Atorvastatin Itching  . Dextran Rash    Other Reaction: Not Assessed    Past Medical History:  Diagnosis Date  . Diabetes mellitus   . Hyperlipemia   . Hypertension     Past Surgical History:  Procedure Laterality Date  . JOINT REPLACEMENT      Family History  Problem Relation Age of Onset  . Cancer Father   . Diabetes Brother     Social History:  reports that he has never smoked. He has never used smokeless tobacco. He reports that he does not drink alcohol or use drugs.  Review of Systems:   Hypertension:   Currently on a regimen of Diovan 320/12.5, doxazosin 2 mg and Norvasc Potassium is normal with taking the10 mEq supplement  BP at home upto ?  148/70, he does not remember his readings  Lab Results  Component Value Date   CREATININE 1.03 09/20/2016   BUN 10 09/20/2016   NA 139 09/20/2016   K 4.0 09/20/2016   CL 103 09/20/2016   CO2 31 09/20/2016     Lipids: He had been on Crestor  and simvastatin and these were stopped because of increased liver functions No statins have been prescribed since then  Again triglycerides are high but LDL below 100 now  with better diet and weight loss   Lab Results  Component Value Date   CHOL 191 09/20/2016   HDL 47.30 09/20/2016   LDLCALC 91 05/15/2014   LDLDIRECT 83.0 09/20/2016   TRIG 219.0 (H) 09/20/2016   CHOLHDL 4 09/20/2016    Abnormal liver functions:  He Previously has had mild increase in liver functions, possibly from fatty liver although his ultrasound was normal in 2011.  He has not had any alcohol intake, nonsteroidal anti-inflammatory drugs  Liver functions are consistently normal with stopping his simvastatin  Lab Results  Component Value  Date   ALT 32 09/20/2016      He has had a 4x 5-mm  prolactinoma since 2007 with baseline prolactin level of 101, treated with Dostinex .  He has had better libido with using Dostinex Half tablet twice a week.  His prolactin is consistently normal  Testosterone level normal Previously   Lab Results  Component Value Date   TESTOSTERONE 308.63 06/10/2016   Erectile dysfunction treated with Cialis    Examination:   BP (!) 142/78   Pulse 83   Ht _0  (1.778 m)   Wt 207 lb 12.8 oz (94.3 kg)   SpO2 97%   BMI 29.82 kg/m   Body mass index is 29.82 kg/m.    Standing blood pressure 140/78 No pedal edema   ASSESSMENT/ PLAN:   Diabetes type 2  See history of present illness for detailed discussion of current diabetes management, blood sugar patterns and problems identified  His blood sugar control is best in a longtime with is doing better on his diet, losing weight after seeing the dietitian and starting Trulicity F7P is 6.6  He has reported good readings at home but not clear how often he is monitoring He is a little more active also However even though he was told to reduce his Amaryl to 2 mg he still taking 4 mg with continued occasional hypoglycemic  symptoms He has cut back his Actoplusmet dosage in half  He was recommended cutting back his Amaryl instead and probably can stop this while increasing Actoplusmet to twice a day This should improve his insulin sensitivity and triglycerides   Hypertension: blood pressure is controlled  He will be switched to Benicar HCT instead of Diovan HCT which is being record He needs to write down his blood pressure readings at home and review on follow-up  HYPERLIPIDEMIA: Not on any statin drug. LDL is fairly good but not clear why triglycerides are still high Consider fish oil    Patient Instructions  Actoplusmet twice daily Stop Glimepride    Joshua Vargas 11/07/2016, 9:44 PM

## 2016-11-07 NOTE — Patient Instructions (Signed)
Actoplusmet twice daily Stop Glimepride

## 2016-11-09 ENCOUNTER — Other Ambulatory Visit: Payer: Self-pay | Admitting: Endocrinology

## 2016-11-09 ENCOUNTER — Telehealth: Payer: Self-pay | Admitting: Endocrinology

## 2016-11-09 NOTE — Telephone Encounter (Signed)
Patient would like to speak to a clinical staff member on why his glimepiride (AMARYL) 4 MG tablet was placed when he was given strict instructions to not take it anymore. Call to advise.

## 2016-11-09 NOTE — Telephone Encounter (Signed)
Do not understand the language of your question

## 2016-11-10 NOTE — Telephone Encounter (Signed)
Pharmacy must be having this on auto refill.  We did not send this prescription

## 2016-11-10 NOTE — Telephone Encounter (Signed)
Patient called to check the status on the note below, patient has been made aware that Endocrinology did not send the rx per Dr. Dwyane Dee and that the pharmacy may have it on auto refill. Patient stated he does not need a call back to discuss further, no further action required.

## 2016-11-10 NOTE — Telephone Encounter (Signed)
Patient received a call from the pharmacy that his glimepiride (AMARYL) 4 MG tablet   was ready for pick up. Patient stated that "Dr. Dwyane Dee gave him strict instructions to not take the medication anymore." Patient wants a call as soon possible to discuss.

## 2016-12-13 ENCOUNTER — Other Ambulatory Visit: Payer: Self-pay | Admitting: Endocrinology

## 2016-12-21 ENCOUNTER — Other Ambulatory Visit: Payer: Self-pay | Admitting: Endocrinology

## 2017-01-24 ENCOUNTER — Other Ambulatory Visit: Payer: Self-pay | Admitting: Endocrinology

## 2017-02-03 ENCOUNTER — Encounter: Payer: Self-pay | Admitting: Endocrinology

## 2017-02-03 ENCOUNTER — Other Ambulatory Visit (INDEPENDENT_AMBULATORY_CARE_PROVIDER_SITE_OTHER): Payer: 59

## 2017-02-03 ENCOUNTER — Ambulatory Visit (INDEPENDENT_AMBULATORY_CARE_PROVIDER_SITE_OTHER): Payer: 59

## 2017-02-03 DIAGNOSIS — Z23 Encounter for immunization: Secondary | ICD-10-CM | POA: Diagnosis not present

## 2017-02-03 DIAGNOSIS — D352 Benign neoplasm of pituitary gland: Secondary | ICD-10-CM

## 2017-02-03 DIAGNOSIS — E1165 Type 2 diabetes mellitus with hyperglycemia: Secondary | ICD-10-CM

## 2017-02-03 LAB — COMPREHENSIVE METABOLIC PANEL
ALBUMIN: 3.9 g/dL (ref 3.5–5.2)
ALT: 29 U/L (ref 0–53)
AST: 33 U/L (ref 0–37)
Alkaline Phosphatase: 56 U/L (ref 39–117)
BUN: 10 mg/dL (ref 6–23)
CALCIUM: 9.2 mg/dL (ref 8.4–10.5)
CO2: 31 meq/L (ref 19–32)
Chloride: 101 mEq/L (ref 96–112)
Creatinine, Ser: 0.95 mg/dL (ref 0.40–1.50)
GFR: 100.58 mL/min (ref >60.00)
Glucose, Bld: 154 mg/dL — ABNORMAL HIGH (ref 70–99)
POTASSIUM: 3.8 meq/L (ref 3.5–5.1)
Sodium: 139 mEq/L (ref 135–145)
Total Bilirubin: 0.5 mg/dL (ref 0.2–1.2)
Total Protein: 7.1 g/dL (ref 6.0–8.3)

## 2017-02-03 LAB — TESTOSTERONE: TESTOSTERONE: 295.9 ng/dL — AB (ref 300.00–890.00)

## 2017-02-03 LAB — HEMOGLOBIN A1C: HEMOGLOBIN A1C: 7.1 % — AB (ref 4.6–6.5)

## 2017-02-04 LAB — PROLACTIN: PROLACTIN: 8.6 ng/mL (ref 4.0–15.2)

## 2017-02-08 ENCOUNTER — Encounter: Payer: Self-pay | Admitting: Endocrinology

## 2017-02-08 ENCOUNTER — Ambulatory Visit (INDEPENDENT_AMBULATORY_CARE_PROVIDER_SITE_OTHER): Payer: 59 | Admitting: Endocrinology

## 2017-02-08 VITALS — BP 140/75 | HR 89 | Ht 70.0 in | Wt 215.6 lb

## 2017-02-08 DIAGNOSIS — E1165 Type 2 diabetes mellitus with hyperglycemia: Secondary | ICD-10-CM

## 2017-02-08 DIAGNOSIS — R5383 Other fatigue: Secondary | ICD-10-CM

## 2017-02-08 DIAGNOSIS — R7989 Other specified abnormal findings of blood chemistry: Secondary | ICD-10-CM | POA: Diagnosis not present

## 2017-02-08 DIAGNOSIS — I10 Essential (primary) hypertension: Secondary | ICD-10-CM

## 2017-02-08 NOTE — Patient Instructions (Signed)
Check blood sugars on waking up  2/7  Also check blood sugars about 2 hours after a meal and do this after different meals by rotation  Recommended blood sugar levels on waking up is 90-130 and about 2 hours after meal is 130-160  Please bring your blood sugar monitor to each visit, thank you  Walk more

## 2017-02-08 NOTE — Progress Notes (Signed)
Patient ID: Joshua Vargas, male   DOB: 02-Mar-1946, 71 y.o.   MRN: [id number]   Reason for Appointment: follow-up of various  problems  History of Present Illness   Type 2 DIABETES MELITUS, date of diagnosis: 2002      Previous history: He was initially treated with metformin alone and then subsequently was also given Actos and Amaryl.  He has had fairly good control but somewhat inconsistent because of difficulties with compliance with diet and exercise periodically. A1c previously has fluctuated between about 6.7-7.6 In 6/15 he was started on Victoza which he wanted to try because of difficulty losing weight and better control  Recent history:   Non-insulin hypoglycemic drugs: Actoplusmet once a day and Amaryl 61m in evening, Trulicity 02.70mg weekly           His A1c has been more recently around 7% although now higher at 7.1 compared to 6.5 in July  Current management, blood sugar patterns and problems identified:  He  was told to try increasing Actoplusmet and cut back on Amaryl because of tendency to hypoglycemia but he still has not changed his regimen  He thinks he has not been exercising or walking as much as before and he has regained all the weight that he had lost on his last visit  No hypoglycemia recently however  Not clear how his blood sugar patterns are, checking infrequently and his postprandial glucose was 154 after breakfast in the lab  No side effects with Trulicity currently and he takes this regularly every week   However still taking 4 mg Amaryl in the evening     Side effects from medications:  anorexia with 1.2 mg Victoza       Monitors blood glucose: Sporadically   Glucometer:  One Touch.          Blood Glucose readings by recall fasting: Up to 120, postprandial occasionally higher but does not remember readings As usual has not brought his monitor for download   Physical activity: exercise:  Walking some               Dietician visit:  Most recent: 36/2376           Complications: are: Erectile dysfunction   Wt Readings from Last 3 Encounters:  02/08/17 215 lb 9.6 oz (97.8 kg)  11/07/16 207 lb 12.8 oz (94.3 kg)  07/19/16 216 lb (98 kg)   LABS:  Lab Results  Component Value Date   HGBA1C 7.1 (H) 02/03/2017   HGBA1C 6.5 09/20/2016   HGBA1C 7.6 (H) 06/10/2016   Lab Results  Component Value Date   MICROALBUR 5.9 (H) 09/20/2016   LDLCALC 91 05/15/2014   CREATININE 0.95 02/03/2017     OTHER problems are discussed in review of systems   LABS:  Lab on 02/03/2017  Component Date Value Ref Range Status  . Hgb A1c MFr Bld 02/03/2017 7.1* 4.6 - 6.5 % Final   Glycemic Control Guidelines for People with Diabetes:Non Diabetic:  <6%Goal of Therapy: <7%Additional Action Suggested:  >8%   . Sodium 02/03/2017 139  135 - 145 mEq/L Final  . Potassium 02/03/2017 3.8  3.5 - 5.1 mEq/L Final  . Chloride 02/03/2017 101  96 - 112 mEq/L Final  . CO2 02/03/2017 31  19 - 32 mEq/L Final  . Glucose, Bld 02/03/2017 154* 70 - 99 mg/dL Final  . BUN 02/03/2017 10  6 - 23 mg/dL Final  . Creatinine, Ser 02/03/2017 0.95  0.40 - 1.50 mg/dL Final  . Total Bilirubin 02/03/2017 0.5  0.2 - 1.2 mg/dL Final  . Alkaline Phosphatase 02/03/2017 56  39 - 117 U/L Final  . AST 02/03/2017 33  0 - 37 U/L Final  . ALT 02/03/2017 29  0 - 53 U/L Final  . Total Protein 02/03/2017 7.1  6.0 - 8.3 g/dL Final  . Albumin 02/03/2017 3.9  3.5 - 5.2 g/dL Final  . Calcium 02/03/2017 9.2  8.4 - 10.5 mg/dL Final  . GFR 02/03/2017 100.58  >60.00 mL/min Final  . Testosterone 02/03/2017 295.90* 300.00 - 890.00 ng/dL Final  . Prolactin 02/03/2017 8.6  4.0 - 15.2 ng/mL Final    Allergies as of 02/08/2017      Reactions   Atorvastatin Itching   Dextran Rash   Other Reaction: Not Assessed      Medication List       Accurate as of 02/08/17  9:23 PM. Always use your most recent med list.          acetaminophen 500 MG tablet Commonly known as:  TYLENOL Take  500 mg by mouth as needed. pain   amLODipine 10 MG tablet Commonly known as:  NORVASC Take 1 tablet (10 mg total) by mouth daily.   aspirin 81 MG EC tablet Take 81 mg by mouth daily.   Azelastine HCl 0.15 % Soln Use one spray in each nostril twice daily.   cabergoline 0.5 MG tablet Commonly known as:  DOSTINEX Take one half-tablet by mouth twice a week (on Monday and Saturday)   CIALIS 5 MG tablet Generic drug:  tadalafil TAKE ONE TABLET BY MOUTH DAILY AS NEEDED FOR ERECTILE DYSFUNCTION   doxazosin 2 MG tablet Commonly known as:  CARDURA TAKE ONE TABLET BY MOUTH AT BEDTIME   Dulaglutide 0.75 MG/0.5ML Sopn Commonly known as:  TRULICITY Inject in the abdominal skin as directed once a week   fluticasone 50 MCG/ACT nasal spray Commonly known as:  FLONASE Place 1 spray into both nostrils 2 (two) times daily.   glimepiride 4 MG tablet Commonly known as:  AMARYL TAKE 2 TABLETS BY MOUTH EVERY DAY   glucose blood test strip Commonly known as:  ONE TOUCH ULTRA TEST Use as instructed to check blood sugar 1 times daily E11.65   montelukast 10 MG tablet Commonly known as:  SINGULAIR TAKE 1 TABLET BY MOUTH EVERY DAY   olmesartan-hydrochlorothiazide 40-12.5 MG tablet Commonly known as:  BENICAR HCT Take 1 tablet by mouth daily.   pioglitazone-metformin 15-850 MG tablet Commonly known as:  ACTOPLUS MET Take 1 tablet by mouth 2 (two) times daily with a meal.   potassium chloride 10 MEQ tablet Commonly known as:  KLOR-CON M10 Take 1 tablet (10 mEq total) by mouth daily.   Vitamin D-3 5000 units Tabs Take 1 capsule by mouth at bedtime.       Allergies:  Allergies  Allergen Reactions  . Atorvastatin Itching  . Dextran Rash    Other Reaction: Not Assessed    Past Medical History:  Diagnosis Date  . Diabetes mellitus   . Hyperlipemia   . Hypertension     Past Surgical History:  Procedure Laterality Date  . JOINT REPLACEMENT      Family History  Problem  Relation Age of Onset  . Cancer Father   . Diabetes Brother     Social History:  reports that he has never smoked. He has never used smokeless tobacco. He reports that he does not drink  alcohol or use drugs.  Review of Systems:   Hypertension:   Currently on a regimen of Diovan 320/12.5, doxazosin 2 mg and Norvasc Potassium is normal with taking the10 mEq supplement  BP at home upto  148/90, he does not remember his readings  Lab Results  Component Value Date   CREATININE 0.95 02/03/2017   BUN 10 02/03/2017   NA 139 02/03/2017   K 3.8 02/03/2017   CL 101 02/03/2017   CO2 31 02/03/2017     Lipids: He had been on Crestor and simvastatin and these were stopped because of increased liver functions No statins have been prescribed since then  Last triglycerides are high but LDL below 100  in June with better diet and weight loss   Lab Results  Component Value Date   CHOL 191 09/20/2016   HDL 47.30 09/20/2016   LDLCALC 91 05/15/2014   LDLDIRECT 83.0 09/20/2016   TRIG 219.0 (H) 09/20/2016   CHOLHDL 4 09/20/2016    Abnormal liver functions:  He Previously has had mild increase in liver functions, possibly from fatty liver although his ultrasound was normal in 2011.  He has not had any alcohol intake, nonsteroidal anti-inflammatory drugs  Liver functions are consistently normal with stopping his simvastatin  Lab Results  Component Value Date   ALT 29 02/03/2017      He has had a 4x 5-mm  prolactinoma since 2007 with baseline prolactin level of 101, treated with Dostinex .  He has had better libido with using Dostinex Half tablet twice a week.  His prolactin is consistently normal Again  He is complaining of more fatigue and some lack of motivation but not significantly complaining about decreased libido Testosterone level  low normal and now slightly below normal, checked midmorning    Lab Results  Component Value Date   TESTOSTERONE 295.90 (L) 02/03/2017    Erectile dysfunction treated with Cialis    Examination:   BP 140/75 (BP Location: Left Arm)   Pulse 89   Ht '5\' 10"'$  (1.778 m)   Wt 215 lb 9.6 oz (97.8 kg)   SpO2 97%   BMI 30.94 kg/m   Body mass index is 30.94 kg/m.   REPEAT blood pressure 140/75, initially was higher No pedal edema   ASSESSMENT/ PLAN:   Diabetes type 2  See history of present illness for detailed discussion of current diabetes management, blood sugar patterns and problems identified  His blood sugar control is best in a longtime with is doing better on his diet, losing weight after seeing the dietitian and starting Trulicity Q6P is 7.1 Most of his sugars are higher compared to his last time along with weight gain because of lack of exercise and consistent diet probably He is also probably not monitoring readings after meals Discussed that he needs to get back into his lifestyle changes again as before while maintaining his medication Again recommended that he cut back his Amaryl if he starts getting low sugars again  FATIGUE and low normal testosterone He may have metabolic syndrome causing low testosterone level and possibly fatigue We will need to evaluate this with a testosterone, LH levels Also rule out anemia Discussed that if he has hypogonadotropic hypogonadism he can easily use clomiphene as a possible treatment otherwise daily testosterone supplement with stress and turmoil preparations.  He is much more in favor of the oral medication He will call him after labs are complete  PROLACTINOMA: He has been on maintenance treatment and will continue  Dostinex especially with his testosterone level tending to be low However may consider reducing the dose on the next visit if prolactin is still low normal   Hypertension: blood pressure is controlled although may be high normal systolic, likely also has some white coat syndrome Not clear if his home readings are accurate and he needs to bring his monitor  for comparison Since he is on relatively high doses of  Benicar HCT and amlodipine will continue the same dose for now  HYPERLIPIDEMIA: Not on any statin drug. Follow-up levels to be done on the next visit fasting  Total visit time for evaluation and management of multiple problems in counseling = 25 minutes  Patient Instructions  Check blood sugars on waking up  2/7  Also check blood sugars about 2 hours after a meal and do this after different meals by rotation  Recommended blood sugar levels on waking up is 90-130 and about 2 hours after meal is 130-160  Please bring your blood sugar monitor to each visit, thank you  Walk more    Joshua Vargas 02/08/2017, 9:23 PM

## 2017-02-09 ENCOUNTER — Other Ambulatory Visit (INDEPENDENT_AMBULATORY_CARE_PROVIDER_SITE_OTHER): Payer: 59

## 2017-02-09 DIAGNOSIS — R7989 Other specified abnormal findings of blood chemistry: Secondary | ICD-10-CM

## 2017-02-09 DIAGNOSIS — R5383 Other fatigue: Secondary | ICD-10-CM | POA: Diagnosis not present

## 2017-02-09 LAB — CBC
HCT: 43.6 % (ref 39.0–52.0)
Hemoglobin: 14.3 g/dL (ref 13.0–17.0)
MCHC: 32.7 g/dL (ref 30.0–36.0)
MCV: 85.2 fl (ref 78.0–100.0)
PLATELETS: 200 10*3/uL (ref 150.0–400.0)
RBC: 5.12 Mil/uL (ref 4.22–5.81)
RDW: 14.1 % (ref 11.5–15.5)
WBC: 4.1 10*3/uL (ref 4.0–10.5)

## 2017-02-09 LAB — LUTEINIZING HORMONE: LH: 3.16 m[IU]/mL (ref 3.10–34.60)

## 2017-02-10 ENCOUNTER — Encounter: Payer: Self-pay | Admitting: Endocrinology

## 2017-02-10 LAB — TESTOSTERONE, FREE, TOTAL, SHBG
Sex Hormone Binding: 35 nmol/L (ref 19.3–76.4)
TESTOSTERONE FREE: 10.5 pg/mL (ref 6.6–18.1)
TESTOSTERONE: 430 ng/dL (ref 264–916)

## 2017-02-12 ENCOUNTER — Other Ambulatory Visit: Payer: Self-pay | Admitting: Allergy and Immunology

## 2017-02-15 ENCOUNTER — Ambulatory Visit (INDEPENDENT_AMBULATORY_CARE_PROVIDER_SITE_OTHER): Payer: 59 | Admitting: Allergy

## 2017-02-15 ENCOUNTER — Encounter: Payer: Self-pay | Admitting: Allergy

## 2017-02-15 VITALS — BP 134/70 | HR 90 | Temp 97.9°F | Resp 18 | Ht 69.0 in | Wt 215.0 lb

## 2017-02-15 DIAGNOSIS — J339 Nasal polyp, unspecified: Secondary | ICD-10-CM | POA: Diagnosis not present

## 2017-02-15 DIAGNOSIS — E0800 Diabetes mellitus due to underlying condition with hyperosmolarity without nonketotic hyperglycemic-hyperosmolar coma (NKHHC): Secondary | ICD-10-CM

## 2017-02-15 DIAGNOSIS — J3089 Other allergic rhinitis: Secondary | ICD-10-CM

## 2017-02-15 MED ORDER — MONTELUKAST SODIUM 10 MG PO TABS: 10.0000 mg | ORAL_TABLET | Freq: Every day | ORAL | 5 refills | Status: DC

## 2017-02-15 MED ORDER — AZELASTINE HCL 0.15 % NA SOLN: NASAL | 5 refills | Status: DC

## 2017-02-15 MED ORDER — METHYLPREDNISOLONE ACETATE 80 MG/ML IJ SUSP
80.0000 mg | Freq: Once | INTRAMUSCULAR | Status: AC
Start: 2017-02-15 — End: 2017-02-15
  Administered 2017-02-15: 80 mg via INTRAMUSCULAR

## 2017-02-15 NOTE — Patient Instructions (Addendum)
1. Perennial and seasonal allergic rhinitis DepoMedrol 80 mg given in clinic today. Side effects of Depo-Medrol including hyperglycemia, hypertension, cataract development, increased risk for infection, and osteoporosis discussed prior to injection in the clinic today. Strategies to avoid the need for systemic steroids such as use of nasal sprays, uses Singulair, and beginning a daily antihistamine were discussed.  Begin Flonase one spray in each nostril daily Begin Asteline nasal spray, one spray in each nostril twice a day Allergen avoidance reinforced Continue to take Singulair 10 mg daily Add an oral antihistamine daily. Samples of Allegra and Xyzal given  2. Nasal polyps Use Flonase as above  3. Diabetes mellitus due to underlying condition with hyperosmolarity without coma, without long-term current use of insulin (HCC) Continue to check blood sugars Continue Metformin as prescribed Continue Trulicity injections as prescribed  Follow up in 6 months

## 2017-02-15 NOTE — Progress Notes (Addendum)
[place] [address] Dept: [phone]  FAMILY NURSE PRACTITIONER FOLLOW UP NOTE  Patient ID: Joshua Vargas, male    DOB: 19-Nov-1945  Age: 71 y.o. MRN: [id number] Date of Office Visit: 02/15/2017  Chief Complaint: Nasal Congestion (allergies are flared up. he also needs refills on his medications. congestion is worse at night and early in the morning. )  HPI Joshua Vargas is a 71 year old male patient in the clinic today for nasal congestion and a yearly follow-up. He was last in the office 10/27/2015 and was seen by Dr. Neldon Mc. At that time he was reporting severe nasal congestion and complete anosmia which was inhibiting his sleep. He received a systemic steroid to treat inflammation at that visit. He did not follow through with lab work (aeroallergen profile) at the last visit.  At today's visit, Joshua Vargas reports an increase in nasal congestion that began one month ago. He is reporting sneezing mostly in the morning after blowing clear drainage from his nose. Joshua Vargas is not having trouble smelling or tasting. He uses Flonase about once a month when his nose "feels bad" and is not currently using azelastine. He does take montelukast 10 mg once daily. He is requesting another systemic steroid today. He is aware that this will increase his blood sugar for which he is already taking several medications. He does report checking his blood sugar daily.    Drug Allergies:  Allergies  Allergen Reactions  . Atorvastatin Itching  . Dextran Rash    Other Reaction: Not Assessed    Physical Exam: BP 134/70   Pulse 90   Temp 97.9 F (36.6 C) (Oral)   Resp 18   Ht 5\' 9"  (1.753 m)   Wt 215 lb (97.5 kg)   SpO2 98%   BMI 31.75 kg/m    Physical Exam  Constitutional: He is oriented to person, place, and time. He appears well-developed and well-nourished.  HENT:  Right Ear: External ear normal.  Left Ear: External ear normal.  Bilateral nares slightly erythematous  and edematous. No nasal polyps noted. Clear drainage noted  Eyes: Conjunctivae are normal.  Neck: Normal range of motion. Neck supple.  Cardiovascular:  Irregular heart rate noted  Pulmonary/Chest:  Lungs clear to auscultation  Musculoskeletal: Normal range of motion.  Neurological: He is alert and oriented to person, place, and time.  Skin: Skin is warm and dry.  Psychiatric: He has a normal mood and affect. His behavior is normal.     Assessment and Plan: 1. Perennial and seasonal allergic rhinitis   2. Nasal polyps   3. Diabetes mellitus due to underlying condition with hyperosmolarity without coma, without long-term current use of insulin ([place])     Meds ordered this encounter  Medications  . Azelastine HCl 0.15 % SOLN    Sig: Use one spray in each nostril twice daily.    Dispense:  30 mL    Refill:  5  . montelukast (SINGULAIR) 10 MG tablet    Sig: Take 1 tablet (10 mg total) at bedtime by mouth.    Dispense:  30 tablet    Refill:  5    Last refill needs appt  . methylPREDNISolone acetate (DEPO-MEDROL) injection 80 mg    Patient Instructions  1. Perennial and seasonal allergic rhinitis DepoMedrol 80 mg given in clinic today. Side effects of Depo-Medrol including hyperglycemia, hypertension, cataract development, increased risk for infection, and osteoporosis discussed prior to injection in the clinic today. Strategies  to avoid the need for systemic steroids such as use of nasal sprays, Singulair, and beginning a daily antihistamine were discussed and patient demonstrated understanding. Begin Flonase one spray in each nostril daily Begin Asteline nasal spray, one spray in each nostril twice a day Allergen avoidance reinforced Continue to take Singulair 10 mg daily Add an oral antihistamine daily. Samples of Allegra and Xyzal given  2. Nasal polyps Use Flonase as above  3. Diabetes mellitus due to underlying condition with hyperosmolarity without coma, without long-term  current use of insulin (HCC) Continue to check blood sugars Continue Metformin as prescribed Continue Trulicity injections as prescribed  Follow up in 6 months   Return in about 6 months (around 08/15/2017), or if symptoms worsen or fail to improve.    Thank you for the opportunity to care for this patient.  Please do not hesitate to contact me with questions.  Gareth Morgan, FNP [facility] [place]: I performed/discussed the history and physical examination of the patient as well as management with NP Simrah Chatham. I reviewed the NP's note and agree with the documented findings and plan of care with following additions/exceptions:  As per NP Brylen Wagar A/P discussed side effects in detail with use of systemic steroids.  He was very adamant that steroid in the past cleared his allergy symptoms for the year and that symptoms occur about once a year.  Despite efforts to manage his current symptoms with antihistamine, singulair and nasal sprays he still requested to be provided with steroid.  He did agree to follow-up in 6 months to start preventative therapy in hopes to avoid systemic steroid use next year and in the future.    Prudy Feeler, MD [facility]

## 2017-02-17 ENCOUNTER — Other Ambulatory Visit: Payer: Self-pay

## 2017-02-17 MED ORDER — PIOGLITAZONE HCL-METFORMIN HCL 15-850 MG PO TABS: 1.0000 | ORAL_TABLET | Freq: Two times a day (BID) | ORAL | 2 refills | Status: DC

## 2017-03-15 ENCOUNTER — Other Ambulatory Visit: Payer: Self-pay | Admitting: Endocrinology

## 2017-03-23 ENCOUNTER — Other Ambulatory Visit: Payer: Self-pay | Admitting: Endocrinology

## 2017-04-06 ENCOUNTER — Other Ambulatory Visit: Payer: Self-pay | Admitting: Endocrinology

## 2017-05-08 ENCOUNTER — Other Ambulatory Visit: Payer: Self-pay | Admitting: Endocrinology

## 2017-05-11 ENCOUNTER — Ambulatory Visit: Payer: 59 | Admitting: Endocrinology

## 2017-05-16 DIAGNOSIS — Z96652 Presence of left artificial knee joint: Secondary | ICD-10-CM | POA: Insufficient documentation

## 2017-06-09 ENCOUNTER — Other Ambulatory Visit: Payer: Self-pay | Admitting: Endocrinology

## 2017-09-15 ENCOUNTER — Other Ambulatory Visit: Payer: Self-pay | Admitting: Endocrinology

## 2017-09-18 ENCOUNTER — Other Ambulatory Visit: Payer: Self-pay | Admitting: Endocrinology

## 2017-10-02 ENCOUNTER — Other Ambulatory Visit: Payer: Self-pay | Admitting: Endocrinology

## 2017-10-03 ENCOUNTER — Other Ambulatory Visit (INDEPENDENT_AMBULATORY_CARE_PROVIDER_SITE_OTHER): Payer: 59

## 2017-10-03 ENCOUNTER — Other Ambulatory Visit: Payer: Self-pay | Admitting: Endocrinology

## 2017-10-03 DIAGNOSIS — E1165 Type 2 diabetes mellitus with hyperglycemia: Secondary | ICD-10-CM | POA: Diagnosis not present

## 2017-10-03 DIAGNOSIS — R7989 Other specified abnormal findings of blood chemistry: Secondary | ICD-10-CM

## 2017-10-03 DIAGNOSIS — E782 Mixed hyperlipidemia: Secondary | ICD-10-CM | POA: Diagnosis not present

## 2017-10-03 DIAGNOSIS — D352 Benign neoplasm of pituitary gland: Secondary | ICD-10-CM

## 2017-10-03 LAB — LIPID PANEL
Cholesterol: 257 mg/dL — ABNORMAL HIGH (ref 0–200)
HDL: 48.4 mg/dL (ref >39.00)
Total CHOL/HDL Ratio: 5

## 2017-10-03 LAB — COMPREHENSIVE METABOLIC PANEL
ALBUMIN: 4.1 g/dL (ref 3.5–5.2)
ALK PHOS: 71 U/L (ref 39–117)
ALT: 34 U/L (ref 0–53)
AST: 49 U/L — AB (ref 0–37)
BILIRUBIN TOTAL: 0.5 mg/dL (ref 0.2–1.2)
BUN: 11 mg/dL (ref 6–23)
CO2: 31 mEq/L (ref 19–32)
CREATININE: 1.02 mg/dL (ref 0.40–1.50)
Calcium: 9.5 mg/dL (ref 8.4–10.5)
Chloride: 99 mEq/L (ref 96–112)
GFR: 92.48 mL/min (ref >60.00)
Glucose, Bld: 169 mg/dL — ABNORMAL HIGH (ref 70–99)
Potassium: 3.6 mEq/L (ref 3.5–5.1)
Sodium: 139 mEq/L (ref 135–145)
Total Protein: 7.5 g/dL (ref 6.0–8.3)

## 2017-10-03 LAB — MICROALBUMIN / CREATININE URINE RATIO
CREATININE, U: 150.5 mg/dL
MICROALB UR: 20.9 mg/dL — AB (ref 0.0–1.9)
MICROALB/CREAT RATIO: 13.9 mg/g (ref 0.0–30.0)

## 2017-10-03 LAB — TESTOSTERONE: Testosterone: 205.08 ng/dL — ABNORMAL LOW (ref 300.00–890.00)

## 2017-10-03 LAB — LDL CHOLESTEROL, DIRECT: Direct LDL: 106 mg/dL

## 2017-10-03 LAB — HEMOGLOBIN A1C: HEMOGLOBIN A1C: 8.3 % — AB (ref 4.6–6.5)

## 2017-10-04 ENCOUNTER — Ambulatory Visit (INDEPENDENT_AMBULATORY_CARE_PROVIDER_SITE_OTHER): Payer: 59 | Admitting: Endocrinology

## 2017-10-04 ENCOUNTER — Other Ambulatory Visit: Payer: Self-pay

## 2017-10-04 ENCOUNTER — Encounter: Payer: Self-pay | Admitting: Endocrinology

## 2017-10-04 VITALS — BP 160/72 | HR 72 | Ht 69.0 in | Wt 211.6 lb

## 2017-10-04 DIAGNOSIS — D352 Benign neoplasm of pituitary gland: Secondary | ICD-10-CM | POA: Diagnosis not present

## 2017-10-04 DIAGNOSIS — E1165 Type 2 diabetes mellitus with hyperglycemia: Secondary | ICD-10-CM | POA: Diagnosis not present

## 2017-10-04 DIAGNOSIS — I10 Essential (primary) hypertension: Secondary | ICD-10-CM | POA: Diagnosis not present

## 2017-10-04 DIAGNOSIS — R7989 Other specified abnormal findings of blood chemistry: Secondary | ICD-10-CM

## 2017-10-04 DIAGNOSIS — E782 Mixed hyperlipidemia: Secondary | ICD-10-CM

## 2017-10-04 LAB — PROLACTIN: Prolactin: 18.4 ng/mL — ABNORMAL HIGH (ref 4.0–15.2)

## 2017-10-04 MED ORDER — OLMESARTAN MEDOXOMIL-HCTZ 20-12.5 MG PO TABS: 1.0000 | ORAL_TABLET | Freq: Every day | ORAL | 3 refills | Status: DC

## 2017-10-04 MED ORDER — GLIMEPIRIDE 4 MG PO TABS: ORAL_TABLET | ORAL | 2 refills | Status: DC

## 2017-10-04 NOTE — Patient Instructions (Addendum)
Check blood sugars on waking up  2/7 Also check blood sugars about 2 hours after a meal and do this after different meals by rotation  Recommended blood sugar levels on waking up is 90-130 and about 2 hours after meal is 130-160  Please bring your blood sugar monitor to each visit, thank you  Plan meals well

## 2017-10-04 NOTE — Progress Notes (Signed)
Patient ID: Joshua Vargas, male   DOB: [birth year], 72 y.o.   MRN: [id number]   Reason for Appointment: follow-up of various  problems  History of Present Illness   Type 2 DIABETES MELITUS, date of diagnosis: 2002      Previous history: He was initially treated with metformin alone and then subsequently was also given Actos and Amaryl.  He has had fairly good control but somewhat inconsistent because of difficulties with compliance with diet and exercise periodically. A1c previously has fluctuated between about 6.7-7.6 In 6/15 he was started on Victoza which he wanted to try because of difficulty losing weight and better control  Recent history:   Non-insulin hypoglycemic drugs: Actoplusmet once a day and Amaryl 41m in evening, Trulicity 06.55mg weekly           His A1c has been previously around 7% and now at the highest level of 8.3   Current management, blood sugar patterns and problems identified:  He has not been seen in follow-up since 01/2017  He has been irregular with his medications and has not taken Amaryl for the last month and Trulicity for at least 2 weeks  Also he thinks he has not been watching his diet and frequently eating fast food at lunchtime  Currently not motivated to exercise  He has not checked his sugars  His fasting blood sugar was 169  His weight is down about 4 pounds  He is supposed to take Actoplusmet twice a day but is taking this only once a day     Side effects from medications:  anorexia with 1.2 mg Victoza       Monitors blood glucose: Sporadically   Glucometer:  One Touch.           Blood Glucose readings: none   Physical activity: exercise:  Walking some               Dietician visit: Most recent: [id number]           Complications: are: Erectile dysfunction   Wt Readings from Last 3 Encounters:  10/04/17 211 lb 9.6 oz (96 kg)  02/15/17 215 lb (97.5 kg)  02/08/17 215 lb 9.6 oz (97.8 kg)   LABS:  Lab Results    Component Value Date   HGBA1C 8.3 (H) 10/03/2017   HGBA1C 7.1 (H) 02/03/2017   HGBA1C 6.5 09/20/2016   Lab Results  Component Value Date   MICROALBUR 20.9 (H) 10/03/2017   LDLCALC 91 05/15/2014   CREATININE 1.02 10/03/2017     OTHER problems are discussed in review of systems   LABS:  Lab on 10/03/2017  Component Date Value Ref Range Status  . Prolactin 10/03/2017 18.4* 4.0 - 15.2 ng/mL Final  . Testosterone 10/03/2017 205.08* 300.00 - 890.00 ng/dL Final  . Cholesterol 10/03/2017 257* 0 - 200 mg/dL Final   ATP III Classification       Desirable:  < 200 mg/dL               Borderline High:  200 - 239 mg/dL          High:  > = 240 mg/dL  . Triglycerides 10/03/2017 418.0 Triglyceride is over 400; calculations on Lipids are invalid.* 0.0 - 149.0 mg/dL Final   Normal:  <150 mg/dLBorderline High:  150 - 199 mg/dL  . HDL 10/03/2017 48.40  >39.00 mg/dL Final  . Total CHOL/HDL Ratio 10/03/2017 5   Final  Men          Women1/2 Average Risk     3.4          3.3Average Risk          5.0          4.42X Average Risk          9.6          7.13X Average Risk          15.0          11.0                      . Microalb, Ur 10/03/2017 20.9* 0.0 - 1.9 mg/dL Final  . Creatinine,U 10/03/2017 150.5  mg/dL Final  . Microalb Creat Ratio 10/03/2017 13.9  0.0 - 30.0 mg/g Final  . Sodium 10/03/2017 139  135 - 145 mEq/L Final  . Potassium 10/03/2017 3.6  3.5 - 5.1 mEq/L Final  . Chloride 10/03/2017 99  96 - 112 mEq/L Final  . CO2 10/03/2017 31  19 - 32 mEq/L Final  . Glucose, Bld 10/03/2017 169* 70 - 99 mg/dL Final  . BUN 10/03/2017 11  6 - 23 mg/dL Final  . Creatinine, Ser 10/03/2017 1.02  0.40 - 1.50 mg/dL Final  . Total Bilirubin 10/03/2017 0.5  0.2 - 1.2 mg/dL Final  . Alkaline Phosphatase 10/03/2017 71  39 - 117 U/L Final  . AST 10/03/2017 49* 0 - 37 U/L Final  . ALT 10/03/2017 34  0 - 53 U/L Final  . Total Protein 10/03/2017 7.5  6.0 - 8.3 g/dL Final  . Albumin 10/03/2017 4.1   3.5 - 5.2 g/dL Final  . Calcium 10/03/2017 9.5  8.4 - 10.5 mg/dL Final  . GFR 10/03/2017 92.48  >60.00 mL/min Final  . Hgb A1c MFr Bld 10/03/2017 8.3* 4.6 - 6.5 % Final   Glycemic Control Guidelines for People with Diabetes:Non Diabetic:  <6%Goal of Therapy: <7%Additional Action Suggested:  >8%   . Direct LDL 10/03/2017 106.0  mg/dL Final   Optimal:  <100 mg/dLNear or Above Optimal:  100-129 mg/dLBorderline High:  130-159 mg/dLHigh:  160-189 mg/dLVery High:  >190 mg/dL    Allergies as of 10/04/2017      Reactions   Atorvastatin Itching   Dextran Rash   Other Reaction: Not Assessed      Medication List        Accurate as of 10/04/17  8:33 PM. Always use your most recent med list.          acetaminophen 500 MG tablet Commonly known as:  TYLENOL Take 500 mg by mouth as needed. pain   amLODipine 10 MG tablet Commonly known as:  NORVASC TAKE 1 TABLET (10 MG TOTAL) BY MOUTH DAILY.   aspirin 81 MG EC tablet Take 81 mg by mouth daily.   Azelastine HCl 0.15 % Soln Use one spray in each nostril twice daily.   cabergoline 0.5 MG tablet Commonly known as:  DOSTINEX Take one half-tablet by mouth twice a week (on Monday and Saturday)   doxazosin 2 MG tablet Commonly known as:  CARDURA TAKE ONE TABLET BY MOUTH AT BEDTIME   glimepiride 4 MG tablet Commonly known as:  AMARYL TAKE 1 TABLETS BY MOUTH EVERY DAY at dinner   glucose blood test strip Commonly known as:  ONE TOUCH ULTRA TEST Use as instructed to check blood sugar 1 times daily E11.65   LUMIGAN 0.01 % Soln Generic drug:  bimatoprost INSTILL 1 DROP INTO EACH EYE AT BEDTIME   montelukast 10 MG tablet Commonly known as:  SINGULAIR Take 1 tablet (10 mg total) at bedtime by mouth.   olmesartan-hydrochlorothiazide 20-12.5 MG tablet Commonly known as:  BENICAR HCT Take 1 tablet by mouth daily.   pioglitazone-metformin 15-850 MG tablet Commonly known as:  ACTOPLUS MET Take 1 tablet 2 (two) times daily with a meal by  mouth.   potassium chloride 10 MEQ tablet Commonly known as:  KLOR-CON M10 Take 1 tablet (10 mEq total) by mouth daily.   tadalafil 5 MG tablet Commonly known as:  CIALIS TAKE ONE TABLET BY MOUTH DAILY AS NEEDED FOR ERECTILE DYSFUNCTION   TRULICITY 6.28 BT/5.1VO Sopn Generic drug:  Dulaglutide INJECT 1 PEN INTO THE ABDOMINAL SKIN ONCE A WEEK   Vitamin D-3 5000 units Tabs Take 1 capsule by mouth at bedtime.       Allergies:  Allergies  Allergen Reactions  . Atorvastatin Itching  . Dextran Rash    Other Reaction: Not Assessed    Past Medical History:  Diagnosis Date  . Diabetes mellitus   . Hyperlipemia   . Hypertension     Past Surgical History:  Procedure Laterality Date  . JOINT REPLACEMENT    . TRIGGER FINGER RELEASE      Family History  Problem Relation Age of Onset  . Cancer Father   . Diabetes Brother     Social History:  reports that he has never smoked. He has never used smokeless tobacco. He reports that he does not drink alcohol or use drugs.  Review of Systems:  Numbness present in hands especially the right side and has tingling from the shoulder down for the last month or 2 Also he feels a little unsteady when walking but not weak in his legs No numbness or tingling in his feet  Hypertension:   He has been on a regimen of   doxazosin 2 mg, Benicar HCT and Norvasc Potassium is normal with taking the10 mEq supplement  He does not know when he stopped taking the Benicar HCT and is also not taking Norvasc Has not checked his blood pressure at home  Lab Results  Component Value Date   CREATININE 1.02 10/03/2017   BUN 11 10/03/2017   NA 139 10/03/2017   K 3.6 10/03/2017   CL 99 10/03/2017   CO2 31 10/03/2017     Lipids: He had been on Crestor and simvastatin and these were stopped because of increased liver functions No statins have been prescribed since then  Now triglycerides are significantly high and LDL also is relatively  higher   Lab Results  Component Value Date   CHOL 257 (H) 10/03/2017   HDL 48.40 10/03/2017   LDLCALC 91 05/15/2014   LDLDIRECT 106.0 10/03/2017   TRIG (H) 10/03/2017    418.0 Triglyceride is over 400; calculations on Lipids are invalid.   CHOLHDL 5 10/03/2017    Abnormal liver functions:  He has had mild increase in liver functions, possibly from fatty liver although his ultrasound was normal in 2011.  He has not had any alcohol intake, nonsteroidal anti-inflammatory drugs  Liver functions show the AST to be high although ALT is still normal  Not clear if he had a high ALT from taking simvastatin previously  Lab Results  Component Value Date   ALT 34 10/03/2017       He has had a 4x 5-mm  prolactinoma since 2007 with baseline prolactin level  of 101, treated with Dostinex .  He has not taken his cabergoline for couple of months  He is complaining of more fatigue, lack of motivation and libido Although testosterone had come back to normal last year it is now significantly low again   Lab Results  Component Value Date   TESTOSTERONE 205.08 (L) 10/03/2017   Erectile dysfunction treated with Cialis    Examination:   BP (!) 160/72 (BP Location: Left Arm, Patient Position: Sitting, Cuff Size: Normal)   Pulse 72   Ht _0  (1.753 m)   Wt 211 lb 9.6 oz (96 kg)   SpO2 97%   BMI 31.25 kg/m   Body mass index is 31.25 kg/m.    Diabetic Foot Exam - Simple   Simple Foot Form Diabetic Foot exam was performed with the following findings:  Yes   Visual Inspection No deformities, no ulcerations, no other skin breakdown bilaterally:  Yes Sensation Testing Intact to touch and monofilament testing bilaterally:  Yes Pulse Check Posterior Tibialis and Dorsalis pulse intact bilaterally:  Yes Comments      ASSESSMENT/ PLAN:   Diabetes type 2 with recent BMI 31  See history of present illness for detailed discussion of current diabetes management, blood sugar patterns  and problems identified  He has again been irregular with his follow-up  However because of noncompliance with various issues of self-care and medications his blood sugars are markedly higher with A1c 8.3  Also difficult to know what his blood sugars at home without his monitoring them  Recommended that we try to get his blood sugars back under control which he has not realized were as high as indicated by his A1c For now we will continue his original regimen unchanged but make sure he is taking medication regularly including Trulicity He does need to start planning his meals better and make commitment to cut back on eating out with fast food and other foods and snacks He does need to start walking also regular exercise along with checking blood sugars  Discussed blood sugar targets at various times Will need to have short-term follow-up in 6 weeks or so   PROLACTINOMA: He has previously been well controlled with Dostinex Now with his being irregular with his Dostinex his prolactin is higher than normal This may restore back to normal with restarting medication  HYPOGONADISM: Although his testosterone level had previously been normal it is relatively low and not clear if this is related recently to hyperprolactinemia but also an element of metabolic syndrome He appears to be symptomatic with fatigue and decreased motivation also    Hypertension: blood pressure is not controlled This is again from lack of compliance with his medications, usually requiring 4 drugs for control and he will need to restart his regimen again For now we will have him start only on relatively lower dose of Benicar HCT with 20 mg Also continue potassium supplements   HYPERLIPIDEMIA: Has worsening hyperlipidemia with increased triglycerides, for now will not start any treatment because of his history of increased LFT with simvastatin Discussed need for better diabetes control and diet which may improve his  triglycerides  Gait abnormality and numbness in right arm: He needs to follow-up with PCP for evaluation  Total visit time for evaluation and management of multiple problems in counseling = 25 minutes  Patient Instructions  Check blood sugars on waking up  2/7 Also check blood sugars about 2 hours after a meal and do this after different meals by  rotation  Recommended blood sugar levels on waking up is 90-130 and about 2 hours after meal is 130-160  Please bring your blood sugar monitor to each visit, thank you  Plan meals well    Elayne Snare 10/04/2017, 8:33 PM

## 2017-10-05 ENCOUNTER — Other Ambulatory Visit: Payer: Self-pay

## 2017-10-05 MED ORDER — OLMESARTAN MEDOXOMIL-HCTZ 20-12.5 MG PO TABS: 1.0000 | ORAL_TABLET | Freq: Every day | ORAL | 3 refills | Status: DC

## 2017-10-05 MED ORDER — AMLODIPINE BESYLATE 10 MG PO TABS: 10.0000 mg | ORAL_TABLET | Freq: Every day | ORAL | 3 refills | Status: DC

## 2017-10-05 MED ORDER — PIOGLITAZONE HCL-METFORMIN HCL 15-850 MG PO TABS: 1.0000 | ORAL_TABLET | Freq: Every day | ORAL | 3 refills | Status: DC

## 2017-10-05 MED ORDER — CABERGOLINE 0.5 MG PO TABS: ORAL_TABLET | ORAL | 3 refills | Status: DC

## 2017-10-05 MED ORDER — POTASSIUM CHLORIDE CRYS ER 10 MEQ PO TBCR: 10.0000 meq | EXTENDED_RELEASE_TABLET | Freq: Every day | ORAL | 3 refills | Status: DC

## 2017-10-10 ENCOUNTER — Other Ambulatory Visit: Payer: Self-pay

## 2017-10-10 MED ORDER — IRBESARTAN-HYDROCHLOROTHIAZIDE 150-12.5 MG PO TABS: ORAL_TABLET | ORAL | 3 refills | Status: DC

## 2017-11-28 ENCOUNTER — Other Ambulatory Visit (INDEPENDENT_AMBULATORY_CARE_PROVIDER_SITE_OTHER): Payer: 59

## 2017-11-28 DIAGNOSIS — E1165 Type 2 diabetes mellitus with hyperglycemia: Secondary | ICD-10-CM

## 2017-11-28 DIAGNOSIS — I10 Essential (primary) hypertension: Secondary | ICD-10-CM | POA: Diagnosis not present

## 2017-11-28 DIAGNOSIS — D352 Benign neoplasm of pituitary gland: Secondary | ICD-10-CM

## 2017-11-28 DIAGNOSIS — R7989 Other specified abnormal findings of blood chemistry: Secondary | ICD-10-CM | POA: Diagnosis not present

## 2017-11-28 DIAGNOSIS — E782 Mixed hyperlipidemia: Secondary | ICD-10-CM

## 2017-11-28 LAB — COMPREHENSIVE METABOLIC PANEL
ALBUMIN: 4.2 g/dL (ref 3.5–5.2)
ALK PHOS: 63 U/L (ref 39–117)
ALT: 26 U/L (ref 0–53)
AST: 38 U/L — ABNORMAL HIGH (ref 0–37)
BILIRUBIN TOTAL: 0.5 mg/dL (ref 0.2–1.2)
BUN: 13 mg/dL (ref 6–23)
CALCIUM: 9.7 mg/dL (ref 8.4–10.5)
CO2: 31 mEq/L (ref 19–32)
Chloride: 100 mEq/L (ref 96–112)
Creatinine, Ser: 1.05 mg/dL (ref 0.40–1.50)
GFR: 89.4 mL/min (ref >60.00)
Glucose, Bld: 77 mg/dL (ref 70–99)
Potassium: 3.8 mEq/L (ref 3.5–5.1)
Sodium: 139 mEq/L (ref 135–145)
TOTAL PROTEIN: 7.9 g/dL (ref 6.0–8.3)

## 2017-11-28 LAB — LIPID PANEL
CHOLESTEROL: 229 mg/dL — AB (ref 0–200)
HDL: 53.7 mg/dL (ref >39.00)
NonHDL: 175.52
TRIGLYCERIDES: 254 mg/dL — AB (ref 0.0–149.0)
Total CHOL/HDL Ratio: 4
VLDL: 50.8 mg/dL — ABNORMAL HIGH (ref 0.0–40.0)

## 2017-11-28 LAB — LDL CHOLESTEROL, DIRECT: LDL DIRECT: 121 mg/dL

## 2017-11-28 LAB — LUTEINIZING HORMONE: LH: 5.28 m[IU]/mL (ref 3.10–34.60)

## 2017-11-28 NOTE — Addendum Note (Signed)
Addended by: Kaylyn Lim I on: 11/28/2017 08:01 AM   Modules accepted: Orders

## 2017-11-29 LAB — PROLACTIN: Prolactin: 5.5 ng/mL (ref 2.0–18.0)

## 2017-11-30 ENCOUNTER — Ambulatory Visit: Payer: 59 | Admitting: Endocrinology

## 2017-11-30 DIAGNOSIS — Z0289 Encounter for other administrative examinations: Secondary | ICD-10-CM

## 2017-11-30 LAB — TESTOSTERONE, FREE, TOTAL, SHBG
SEX HORMONE BINDING: 50.1 nmol/L (ref 19.3–76.4)
TESTOSTERONE: 556 ng/dL (ref 264–916)
Testosterone, Free: 7.5 pg/mL (ref 6.6–18.1)

## 2017-11-30 LAB — FRUCTOSAMINE: Fructosamine: 259 umol/L (ref 0–285)

## 2017-12-31 ENCOUNTER — Other Ambulatory Visit: Payer: Self-pay | Admitting: Endocrinology

## 2018-01-25 ENCOUNTER — Observation Stay (HOSPITAL_COMMUNITY)
Admission: EM | Admit: 2018-01-25 | Discharge: 2018-01-26 | Disposition: A | Discharge status: Home / Self Care | Payer: 59 | Attending: Cardiovascular Disease | Admitting: Cardiovascular Disease

## 2018-01-25 ENCOUNTER — Emergency Department (HOSPITAL_COMMUNITY): Payer: 59

## 2018-01-25 ENCOUNTER — Other Ambulatory Visit: Payer: Self-pay

## 2018-01-25 ENCOUNTER — Encounter (HOSPITAL_COMMUNITY): Payer: Self-pay | Admitting: Emergency Medicine

## 2018-01-25 DIAGNOSIS — N529 Male erectile dysfunction, unspecified: Secondary | ICD-10-CM | POA: Insufficient documentation

## 2018-01-25 DIAGNOSIS — E119 Type 2 diabetes mellitus without complications: Secondary | ICD-10-CM | POA: Insufficient documentation

## 2018-01-25 DIAGNOSIS — Z833 Family history of diabetes mellitus: Secondary | ICD-10-CM | POA: Diagnosis not present

## 2018-01-25 DIAGNOSIS — Z7982 Long term (current) use of aspirin: Secondary | ICD-10-CM | POA: Diagnosis not present

## 2018-01-25 DIAGNOSIS — R61 Generalized hyperhidrosis: Secondary | ICD-10-CM | POA: Diagnosis not present

## 2018-01-25 DIAGNOSIS — I200 Unstable angina: Secondary | ICD-10-CM | POA: Diagnosis not present

## 2018-01-25 DIAGNOSIS — R0600 Dyspnea, unspecified: Secondary | ICD-10-CM | POA: Insufficient documentation

## 2018-01-25 DIAGNOSIS — Z888 Allergy status to other drugs, medicaments and biological substances status: Secondary | ICD-10-CM | POA: Insufficient documentation

## 2018-01-25 DIAGNOSIS — E785 Hyperlipidemia, unspecified: Secondary | ICD-10-CM | POA: Insufficient documentation

## 2018-01-25 DIAGNOSIS — I10 Essential (primary) hypertension: Secondary | ICD-10-CM | POA: Diagnosis not present

## 2018-01-25 DIAGNOSIS — Z955 Presence of coronary angioplasty implant and graft: Secondary | ICD-10-CM | POA: Insufficient documentation

## 2018-01-25 DIAGNOSIS — Z8249 Family history of ischemic heart disease and other diseases of the circulatory system: Secondary | ICD-10-CM | POA: Insufficient documentation

## 2018-01-25 DIAGNOSIS — I25110 Atherosclerotic heart disease of native coronary artery with unstable angina pectoris: Principal | ICD-10-CM | POA: Insufficient documentation

## 2018-01-25 DIAGNOSIS — R079 Chest pain, unspecified: Secondary | ICD-10-CM

## 2018-01-25 DIAGNOSIS — Z79899 Other long term (current) drug therapy: Secondary | ICD-10-CM | POA: Insufficient documentation

## 2018-01-25 DIAGNOSIS — Z9889 Other specified postprocedural states: Secondary | ICD-10-CM | POA: Diagnosis not present

## 2018-01-25 DIAGNOSIS — Z794 Long term (current) use of insulin: Secondary | ICD-10-CM | POA: Diagnosis not present

## 2018-01-25 DIAGNOSIS — I493 Ventricular premature depolarization: Secondary | ICD-10-CM | POA: Insufficient documentation

## 2018-01-25 HISTORY — DX: Pneumonia, unspecified organism: J18.9

## 2018-01-25 HISTORY — DX: Type 2 diabetes mellitus without complications: E11.9

## 2018-01-25 HISTORY — DX: Atherosclerotic heart disease of native coronary artery without angina pectoris: I25.10

## 2018-01-25 HISTORY — DX: Personal history of other medical treatment: Z92.89

## 2018-01-25 LAB — CBC WITH DIFFERENTIAL/PLATELET
Abs Immature Granulocytes: 0.02 10*3/uL (ref 0.00–0.07)
BASOS PCT: 1 %
Basophils Absolute: 0 10*3/uL (ref 0.0–0.1)
EOS ABS: 0.3 10*3/uL (ref 0.0–0.5)
Eosinophils Relative: 6 %
HCT: 43.5 % (ref 39.0–52.0)
Hemoglobin: 13.6 g/dL (ref 13.0–17.0)
Immature Granulocytes: 1 %
Lymphocytes Relative: 36 %
Lymphs Abs: 1.5 10*3/uL (ref 0.7–4.0)
MCH: 27 pg (ref 26.0–34.0)
MCHC: 31.3 g/dL (ref 30.0–36.0)
MCV: 86.3 fL (ref 80.0–100.0)
MONOS PCT: 9 %
Monocytes Absolute: 0.4 10*3/uL (ref 0.1–1.0)
Neutro Abs: 2 10*3/uL (ref 1.7–7.7)
Neutrophils Relative %: 47 %
PLATELETS: 215 10*3/uL (ref 150–400)
RBC: 5.04 MIL/uL (ref 4.22–5.81)
RDW: 13.6 % (ref 11.5–15.5)
WBC: 4.1 10*3/uL (ref 4.0–10.5)
nRBC: 0 % (ref 0.0–0.2)

## 2018-01-25 LAB — HEPATIC FUNCTION PANEL
ALK PHOS: 60 U/L (ref 38–126)
ALT: 46 U/L — AB (ref 0–44)
AST: 70 U/L — ABNORMAL HIGH (ref 15–41)
Albumin: 3.9 g/dL (ref 3.5–5.0)
BILIRUBIN DIRECT: 0.1 mg/dL (ref 0.0–0.2)
BILIRUBIN INDIRECT: 0.6 mg/dL (ref 0.3–0.9)
Total Bilirubin: 0.7 mg/dL (ref 0.3–1.2)
Total Protein: 7.5 g/dL (ref 6.5–8.1)

## 2018-01-25 LAB — COMPREHENSIVE METABOLIC PANEL
ALT: 35 U/L (ref 0–44)
AST: 54 U/L — AB (ref 15–41)
Albumin: 3.3 g/dL — ABNORMAL LOW (ref 3.5–5.0)
Alkaline Phosphatase: 50 U/L (ref 38–126)
Anion gap: 9 (ref 5–15)
BILIRUBIN TOTAL: 0.5 mg/dL (ref 0.3–1.2)
BUN: 11 mg/dL (ref 8–23)
CO2: 25 mmol/L (ref 22–32)
CREATININE: 1.08 mg/dL (ref 0.61–1.24)
Calcium: 8.6 mg/dL — ABNORMAL LOW (ref 8.9–10.3)
Chloride: 105 mmol/L (ref 98–111)
GFR calc Af Amer: >60 mL/min (ref >60)
Glucose, Bld: 101 mg/dL — ABNORMAL HIGH (ref 70–99)
POTASSIUM: 3.1 mmol/L — AB (ref 3.5–5.1)
Sodium: 139 mmol/L (ref 135–145)
TOTAL PROTEIN: 6.3 g/dL — AB (ref 6.5–8.1)

## 2018-01-25 LAB — I-STAT TROPONIN, ED
Troponin i, poc: 0 ng/mL (ref 0.00–0.08)
Troponin i, poc: 0.01 ng/mL (ref 0.00–0.08)

## 2018-01-25 LAB — T4, FREE: FREE T4: 0.77 ng/dL — AB (ref 0.82–1.77)

## 2018-01-25 LAB — MAGNESIUM: Magnesium: 1.8 mg/dL (ref 1.7–2.4)

## 2018-01-25 LAB — HEPARIN LEVEL (UNFRACTIONATED): Heparin Unfractionated: 0.27 IU/mL — ABNORMAL LOW (ref 0.30–0.70)

## 2018-01-25 LAB — HEMOGLOBIN A1C
Hgb A1c MFr Bld: 6.6 % — ABNORMAL HIGH (ref 4.8–5.6)
MEAN PLASMA GLUCOSE: 142.72 mg/dL

## 2018-01-25 LAB — PROTIME-INR
INR: 1.07
Prothrombin Time: 13.8 seconds (ref 11.4–15.2)

## 2018-01-25 LAB — CBG MONITORING, ED: GLUCOSE-CAPILLARY: 91 mg/dL (ref 70–99)

## 2018-01-25 LAB — TROPONIN I
Troponin I: <0.03 ng/mL (ref <0.03)
Troponin I: <0.03 ng/mL (ref <0.03)

## 2018-01-25 LAB — GLUCOSE, CAPILLARY: GLUCOSE-CAPILLARY: 110 mg/dL — AB (ref 70–99)

## 2018-01-25 LAB — LIPASE, BLOOD: LIPASE: 37 U/L (ref 11–51)

## 2018-01-25 LAB — TSH: TSH: 1.444 u[IU]/mL (ref 0.350–4.500)

## 2018-01-25 MED ORDER — NITROGLYCERIN 2 % TD OINT
0.5000 [in_us] | TOPICAL_OINTMENT | Freq: Four times a day (QID) | TRANSDERMAL | Status: DC
  Administered 2018-01-25 – 2018-01-26 (×2): 0.5 [in_us] via TOPICAL
  Filled 2018-01-25: qty 30
  Filled 2018-01-25: qty 1

## 2018-01-25 MED ORDER — SODIUM CHLORIDE 0.9 % IV SOLN
INTRAVENOUS | Status: DC
  Administered 2018-01-25: 18:00:00 via INTRAVENOUS

## 2018-01-25 MED ORDER — NITROGLYCERIN 0.4 MG SL SUBL
0.4000 mg | SUBLINGUAL_TABLET | SUBLINGUAL | Status: DC | PRN
  Administered 2018-01-25 (×2): 0.4 mg via SUBLINGUAL
  Filled 2018-01-25: qty 1

## 2018-01-25 MED ORDER — SODIUM CHLORIDE 0.9% FLUSH: 3.0000 mL | Freq: Two times a day (BID) | INTRAVENOUS | Status: DC

## 2018-01-25 MED ORDER — AMLODIPINE BESYLATE 10 MG PO TABS
10.0000 mg | ORAL_TABLET | Freq: Every day | ORAL | Status: DC
  Administered 2018-01-25: 10 mg via ORAL
  Filled 2018-01-25: qty 2

## 2018-01-25 MED ORDER — SODIUM CHLORIDE 0.9 % IV SOLN
INTRAVENOUS | Status: DC
  Administered 2018-01-26 (×2): via INTRAVENOUS

## 2018-01-25 MED ORDER — ASPIRIN 81 MG PO CHEW: 81.0000 mg | CHEWABLE_TABLET | ORAL | Status: DC

## 2018-01-25 MED ORDER — ASPIRIN 81 MG PO CHEW
81.0000 mg | CHEWABLE_TABLET | ORAL | Status: AC
  Administered 2018-01-26: 81 mg via ORAL
  Filled 2018-01-25: qty 1

## 2018-01-25 MED ORDER — INSULIN ASPART 100 UNIT/ML ~~LOC~~ SOLN
0.0000 [IU] | Freq: Three times a day (TID) | SUBCUTANEOUS | Status: DC
  Administered 2018-01-26: 2 [IU] via SUBCUTANEOUS

## 2018-01-25 MED ORDER — ALPRAZOLAM 0.25 MG PO TABS: 0.2500 mg | ORAL_TABLET | Freq: Two times a day (BID) | ORAL | Status: DC | PRN

## 2018-01-25 MED ORDER — ONDANSETRON HCL 4 MG/2ML IJ SOLN: 4.0000 mg | Freq: Four times a day (QID) | INTRAMUSCULAR | Status: DC | PRN

## 2018-01-25 MED ORDER — HEPARIN (PORCINE) IN NACL 100-0.45 UNIT/ML-% IJ SOLN
1250.0000 [IU]/h | INTRAMUSCULAR | Status: DC
  Administered 2018-01-25: 1150 [IU]/h via INTRAVENOUS
  Administered 2018-01-26: 1250 [IU]/h via INTRAVENOUS
  Filled 2018-01-25 (×2): qty 250

## 2018-01-25 MED ORDER — HEPARIN BOLUS VIA INFUSION
4000.0000 [IU] | Freq: Once | INTRAVENOUS | Status: AC
  Administered 2018-01-25: 4000 [IU] via INTRAVENOUS
  Filled 2018-01-25: qty 4000

## 2018-01-25 MED ORDER — SODIUM CHLORIDE 0.9 % IV SOLN: 250.0000 mL | INTRAVENOUS | Status: DC | PRN

## 2018-01-25 MED ORDER — INSULIN ASPART 100 UNIT/ML ~~LOC~~ SOLN: 0.0000 [IU] | Freq: Every day | SUBCUTANEOUS | Status: DC

## 2018-01-25 MED ORDER — METOPROLOL TARTRATE 25 MG PO TABS
25.0000 mg | ORAL_TABLET | Freq: Two times a day (BID) | ORAL | Status: DC
  Administered 2018-01-25 – 2018-01-26 (×2): 25 mg via ORAL
  Filled 2018-01-25 (×2): qty 1

## 2018-01-25 MED ORDER — ACETAMINOPHEN 500 MG PO TABS: 500.0000 mg | ORAL_TABLET | ORAL | Status: DC | PRN

## 2018-01-25 MED ORDER — LATANOPROST 0.005 % OP SOLN
1.0000 [drp] | Freq: Every day | OPHTHALMIC | Status: DC
  Administered 2018-01-25: 1 [drp] via OPHTHALMIC
  Filled 2018-01-25: qty 2.5

## 2018-01-25 MED ORDER — ACETAMINOPHEN 325 MG PO TABS: 650.0000 mg | ORAL_TABLET | ORAL | Status: DC | PRN

## 2018-01-25 MED ORDER — ROSUVASTATIN CALCIUM 10 MG PO TABS
20.0000 mg | ORAL_TABLET | Freq: Every day | ORAL | Status: DC
  Administered 2018-01-25: 20 mg via ORAL
  Filled 2018-01-25: qty 1
  Filled 2018-01-25: qty 2

## 2018-01-25 MED ORDER — SODIUM CHLORIDE 0.9% FLUSH: 3.0000 mL | INTRAVENOUS | Status: DC | PRN

## 2018-01-25 MED ORDER — POTASSIUM CHLORIDE CRYS ER 20 MEQ PO TBCR
40.0000 meq | EXTENDED_RELEASE_TABLET | Freq: Once | ORAL | Status: AC
  Administered 2018-01-25: 40 meq via ORAL
  Filled 2018-01-25: qty 2

## 2018-01-25 MED ORDER — INFLUENZA VAC SPLIT HIGH-DOSE 0.5 ML IM SUSY
0.5000 mL | PREFILLED_SYRINGE | INTRAMUSCULAR | Status: DC
  Filled 2018-01-25: qty 0.5

## 2018-01-25 MED ORDER — AMLODIPINE BESYLATE 5 MG PO TABS: 10.0000 mg | ORAL_TABLET | Freq: Every day | ORAL | Status: DC

## 2018-01-25 MED ORDER — DOXAZOSIN MESYLATE 2 MG PO TABS
2.0000 mg | ORAL_TABLET | Freq: Every day | ORAL | Status: DC
  Administered 2018-01-25: 2 mg via ORAL
  Filled 2018-01-25: qty 1

## 2018-01-25 MED ORDER — ASPIRIN EC 81 MG PO TBEC
81.0000 mg | DELAYED_RELEASE_TABLET | Freq: Every day | ORAL | Status: DC
  Filled 2018-01-25: qty 1

## 2018-01-25 NOTE — ED Notes (Signed)
ED Provider at bedside. 

## 2018-01-25 NOTE — ED Triage Notes (Signed)
Patient arrived from work via EMS because of Chest pain of 8  X 3 days. He reports the pain continued to get worse-left side chest pressure "like someone sitting on it." He receive 324 ASA and Nitro X3.  After third Nitro, patient is report feeling relieved and denies pain at this time.

## 2018-01-25 NOTE — ED Notes (Signed)
Patient denies pain and is resting comfortably.  

## 2018-01-25 NOTE — Progress Notes (Signed)
ANTICOAGULATION CONSULT NOTE - Follow Up Consult  Pharmacy Consult for heparin Indication: chest pain/ACS  Allergies  Allergen Reactions  . Atorvastatin Itching  . Dextran Rash    Other Reaction: Not Assessed    Patient Measurements: Height: 5\' 9"  (175.3 cm) Weight: 207 lb (93.9 kg) IBW/kg (Calculated) : 70.7 Heparin Dosing Weight: 90 kg  Vital Signs: BP: 139/73 (10/17 1030) Pulse Rate: 70 (10/17 1330)  Labs: Recent Labs    01/25/18 1022  HGB 13.6  HCT 43.5  PLT 215  LABPROT 13.8  INR 1.07  CREATININE 1.08   Estimated Creatinine Clearance: 71 mL/min (by C-G formula based on SCr of 1.08 mg/dL).  Assessment: 72 yo M presents with CP. Pharmacy consulted to start heparin for ACS. No anticoag PTA. CBC stable.  Goal of Therapy:  Heparin level 0.3-0.7 units/ml Monitor platelets by anticoagulation protocol: Yes   Plan:  Give heparin 4,000 unit heparin bolus Start heparin gtt at 1,150 units/hr Monitor daily heparin level, CBC, s/s of bleed   Randi [employer] J 01/25/2018,2:47 PM

## 2018-01-25 NOTE — ED Notes (Signed)
Got patient undress on the monitor did ekg shown to er doctor patient is resting with call bell in reach 

## 2018-01-25 NOTE — H&P (Addendum)
Cardiology Admission History and Physical:   Patient ID: Joshua Vargas MRN: [id number]; DOB: [birth year]   Admission date: 01/25/2018  Primary Care Provider: Seward Carol, MD Primary Cardiologist: No primary care provider on file. NEW Primary Electrophysiologist:  None   Chief Complaint:  Chest pain  Patient Profile:   Joshua Vargas is a 72 y.o. male with DM. HLD, HTN and prior cardiac cath in 2006 with diag disease.  Now presents with chest pain.   History of Present Illness:   [name] with above hx. And FH of premature CAD with brother.  Over last 3 days he has been having Lt ant chest pain and at night the pain will radiate to lt arm.  Some mild Dyspnea and diaphoresis.  He has been  dizzy, at one point while driving felt he could pass out.  Some skipped heart beats.   Today worse sharp but heavy pressure in addition.  He was at work and EMS called.  He took baby Asprin and NTG total of 3 with relief of pain.  currently no chest pain, though he may have some with deep breath but not like the pain he has been having.     EKG I personally reviewed, SR with mild ST depression. +PVC.   Depression is new.  Troponin 0.00  K+ 3.1, Na 139, BUN 11, Cr 1.08 AST 54 Hgb 13.6, HCT 43.5, Plts 215.   2V CXR no acute process.    Past Medical History:  Diagnosis Date  . Diabetes mellitus   . Hyperlipemia   . Hypertension     Past Surgical History:  Procedure Laterality Date  . CARDIAC CATHETERIZATION    . JOINT REPLACEMENT    . TRIGGER FINGER RELEASE       Medications Prior to Admission: Prior to Admission medications   Medication Sig Start Date End Date Taking? Authorizing Provider  acetaminophen (TYLENOL) 500 MG tablet Take 500 mg by mouth as needed. pain    Yes [provider]  amLODipine (NORVASC) 10 MG tablet Take 1 tablet (10 mg total) by mouth daily. 10/05/17  Yes Elayne Snare, MD  aspirin 81 MG EC tablet Take 81 mg by mouth daily.     Yes [provider]  cabergoline (DOSTINEX) 0.5 MG tablet Take one half-tablet by mouth twice a week (on Monday and Saturday) Patient taking differently: Take 0.25 mg by mouth See admin instructions. Take one half-tablet by mouth twice a week (on Wednesday and Saturday 10/05/17  Yes Elayne Snare, MD  doxazosin (CARDURA) 2 MG tablet TAKE ONE TABLET BY MOUTH AT BEDTIME Patient taking differently: Take 2 mg by mouth daily.  09/14/15  Yes Elayne Snare, MD  glimepiride (AMARYL) 4 MG tablet TAKE 1 TABLETS BY MOUTH EVERY DAY at dinner Patient taking differently: Take 4 mg by mouth daily.  10/04/17  Yes Elayne Snare, MD  irbesartan-hydrochlorothiazide (AVALIDE) 150-12.5 MG tablet TAKE ONE TABLET BY MOUTH ONCE DAILY. Patient taking differently: Take 1 tablet by mouth daily.  10/10/17  Yes Elayne Snare, MD  LUMIGAN 0.01 % SOLN Place 1 drop into both eyes at bedtime.  12/07/16  Yes [provider]  pioglitazone-metformin (ACTOPLUS MET) 15-850 MG tablet Take 1 tablet by mouth daily. 10/05/17  Yes Elayne Snare, MD  potassium chloride (KLOR-CON M10) 10 MEQ tablet Take 1 tablet (10 mEq total) by mouth daily. 10/05/17  Yes Elayne Snare, MD  tadalafil (CIALIS) 5 MG tablet TAKE ONE TABLET BY MOUTH DAILY AS NEEDED FOR  ERECTILE DYSFUNCTION Patient taking differently: Take 5 mg by mouth daily as needed for erectile dysfunction.  04/06/17  Yes Elayne Snare, MD  TRULICITY 3.29 JM/4.2AS SOPN INJECT 1 PEN INTO THE ABDOMINAL SKIN ONCE A WEEK Patient taking differently: Inject 1 pen into the skin every Monday.  10/02/17  Yes Elayne Snare, MD  Azelastine HCl 0.15 % SOLN Use one spray in each nostril twice daily. Patient not taking: Reported on 01/25/2018 02/15/17   Dara Hoyer, FNP  glucose blood (ONE TOUCH ULTRA TEST) test strip Use as instructed to check blood sugar 1 times daily E11.65 06/15/16   Elayne Snare, MD  montelukast (SINGULAIR) 10 MG tablet Take 1 tablet (10 mg total) at bedtime by mouth. Patient not taking: Reported on  01/25/2018 02/15/17   Dara Hoyer, FNP     Allergies:    Allergies  Allergen Reactions  . Atorvastatin Itching  . Dextran Rash    Other Reaction: Not Assessed    Social History:   Social History   Socioeconomic History  . Marital status: Married    Spouse name: Not on file  . Number of children: Not on file  . Years of education: Not on file  . Highest education level: Not on file  Occupational History  . Not on file  Social Needs  . Financial resource strain: Not on file  . Food insecurity:    Worry: Not on file    Inability: Not on file  . Transportation needs:    Medical: Not on file    Non-medical: Not on file  Tobacco Use  . Smoking status: Never Smoker  . Smokeless tobacco: Never Used  Substance and Sexual Activity  . Alcohol use: No  . Drug use: No  . Sexual activity: Never  Lifestyle  . Physical activity:    Days per week: Not on file    Minutes per session: Not on file  . Stress: Not on file  Relationships  . Social connections:    Talks on phone: Not on file    Gets together: Not on file    Attends religious service: Not on file    Active member of club or organization: Not on file    Attends meetings of clubs or organizations: Not on file    Relationship status: Not on file  . Intimate partner violence:    Fear of current or ex partner: Not on file    Emotionally abused: Not on file    Physically abused: Not on file    Forced sexual activity: Not on file  Other Topics Concern  . Not on file  Social History Narrative  . Not on file    Family History:   The patient's family history includes Cancer in his father; Diabetes in his brother; Heart attack (age of onset: 11) in his brother.    ROS:  Please see the history of present illness.  General:no colds or fevers, no weight changes Skin:no rashes or ulcers HEENT:no blurred vision, no congestion CV:see HPI PUL:see HPI GI:no diarrhea constipation or melena, no indigestion GU:no hematuria,  no dysuria MS:no joint pain, no claudication Neuro:no syncope, no lightheadedness Endo:no diabetes, no thyroid disease      Physical Exam/Data:   Vitals:   01/25/18 1209 01/25/18 1308 01/25/18 1319 01/25/18 1330  BP:      Pulse: 61 62 64 70  Resp: 19 15 15 18   SpO2: 98% 98% 98% 97%  Weight:      Height:  No intake or output data in the 24 hours ending 01/25/18 1401 Filed Weights   01/25/18 0948  Weight: 93.9 kg   Body mass index is 30.57 kg/m.  General:  Well nourished, well developed, in no acute distress HEENT: normal, Lt mouth droop due to bell's palsy Lymph: no adenopathy Neck: no JVD.  Endocrine:  No thryomegaly Vascular: + rt  carotid bruit,  None on left; pedal pulses 2+ bilaterally  Cardiac:  normal S1, S2; RRR; no murmur, gallup rub or click Lungs:  clear to auscultation bilaterally, no wheezing, rhonchi or rales  Abd: soft, nontender, no hepatomegaly  Ext: no lower ext  edema Musculoskeletal:  No deformities, BUE and BLE strength normal and equal Skin: warm and dry  Neuro:  Alert and oriented X 3 MAE follows commands, no focal abnormalities noted Psych:  Normal affect    Relevant CV Studies: Cardiac cath 2006 FINDINGS:  Central aortic pressure was 155/89 with a mean of 119. LV  pressure is 141/1 with an LVEDP of 10. There is no gradient on aortic valve  on pullback.   CORONARY ANATOMY:  Left main was angiographically normal.   LAD was a long vessel that wrapped the apex. It gave off a single large  proximal diagonal. In the LAD itself there was no significant coronary  disease. In the proximal portion of the large diagonal, there was a 70-75%  stenosis. Initially this appeared to be about 2 mm vessel; however, after  injection of intracoronary nitroglycerin, it appeared to be 2.5 mm and the  lesion did appear to be a slightly more significant.   The left circumflex was a large vessel. It gave off a very tiny ramus. The  distal AV groove  circumflex was small. The majority of the vessel was made  up of the large branching OM1. There was no significant angiographic CAD.   Right coronary artery was a dominant vessel. It gave off a large RV branch  and thus moderate size PDA and two small PLs. There is no significant  coronary artery disease.   Left ventriculogram done in the RAO position showed hyperdynamic LV with EF  of least 70%. There was no significant wall motion abnormalities or mitral  regurgitation.   ASSESSMENT:  1.  Single-vessel coronary artery disease with borderline lesion in the      proximal first diagonal.  2.  Normal left ventricular function.  3.  Poorly controlled hypertension.   DISCUSSION:  Diagonal lesion does appear to be significant; however, it is 2-  2.5 mm vessel. Given his diabetes he would be at high risk for restenosis of  stent. Thus I do think it may be reasonable to proceed with a trial of  medical therapy. However, given his symptoms, I will review the  catheterization films with the interventional team in the morning to further  to decide. For now we will admit him and rule him out for myocardial  infarction and also proceed with risk factor management.    Laboratory Data:  Chemistry Recent Labs  Lab 01/25/18 1022  NA 139  K 3.1*  CL 105  CO2 25  GLUCOSE 101*  BUN 11  CREATININE 1.08  CALCIUM 8.6*  GFRNONAA >60  GFRAA >60  ANIONGAP 9    Recent Labs  Lab 01/25/18 1022  PROT 6.3*  ALBUMIN 3.3*  AST 54*  ALT 35  ALKPHOS 50  BILITOT 0.5   Hematology Recent Labs  Lab 01/25/18 1022  WBC 4.1  RBC 5.04  HGB 13.6  HCT 43.5  MCV 86.3  MCH 27.0  MCHC 31.3  RDW 13.6  PLT 215   Cardiac EnzymesNo results for input(s): TROPONINI in the last 168 hours.  Recent Labs  Lab 01/25/18 1039  TROPIPOC 0.00    BNPNo results for input(s): BNP, PROBNP in the last 168 hours.  DDimer No results for input(s): DDIMER in the last 168 hours.  Radiology/Studies:  Dg  Chest 2 View  Result Date: 01/25/2018 CLINICAL DATA:  Left side chest pain, shortness of Breath EXAM: CHEST - 2 VIEW COMPARISON:  04/28/2007 FINDINGS: The heart size and mediastinal contours are within normal limits. Both lungs are clear. The visualized skeletal structures are unremarkable. IMPRESSION: Heart and mediastinal contours are within normal limits. No focal opacities or effusions. No acute bony abnormality. Electronically Signed   By: Rolm Baptise M.D.   On: 01/25/2018 11:54    Assessment and Plan:   1. Unstable angina, troponin poc neg.  3 NTG SL with relief of pain.  Begin IV heparin if Dr. Acie Fredrickson agrees.  Admit with rule out MI and possible cardiac cath today or tomorrow. Serial troponins,  Add BB.  2. Hx of CAD with diag disease 3. DM-2  4. HLD allergic to lipitor with rash.    Severity of Illness: The appropriate patient status for this patient is INPATIENT. Inpatient status is judged to be reasonable and necessary in order to provide the required intensity of service to ensure the patient's safety. The patient's presenting symptoms, physical exam findings, and initial radiographic and laboratory data in the context of their chronic comorbidities is felt to place them at high risk for further clinical deterioration. Furthermore, it is not anticipated that the patient will be medically stable for discharge from the hospital within 2 midnights of admission. The following factors support the patient status of inpatient.   " The patient's presenting symptoms include increasing angina. " The worrisome physical exam findings include angina with dyspnea radiation to lt arm and diaphoresis. " The initial radiographic and laboratory data are worrisome because of stable. " The chronic co-morbidities include DM, HTN, HLD and FH CAD.   * I certify that at the point of admission it is my clinical judgment that the patient will require inpatient hospital care spanning beyond 2 midnights from  the point of admission due to high intensity of service, high risk for further deterioration and high frequency of surveillance required.*    For questions or updates, please contact [place] Please consult [email or web address] for contact info under        Signed, Cecilie Kicks, NP  01/25/2018 2:01 PM   Attending Note:   The patient was seen and examined.  Agree with assessment and plan as noted above.  Changes made to the above note as needed.  Patient seen and independently examined with  Cecilie Kicks, NP .   We discussed all aspects of the encounter. I agree with the assessment and plan as stated above.  1.  Chest pain: The patient presents with a month-long history of progressive exertional shortness of breath.  He previously could do yard work and climb stairs without any problems.  For the past month he has had severe shortness of breath whenever he climbs stairs or did any degree of yard work.  He had chest discomfort today.  It was described as a tightness.  It was left sternal pain with radiation down to the arm.  The pain was relieved  with 3 sublingual nitroglycerin.  He feels well now.  His EKG shows no acute ST or T wave changes. He has known coronary artery disease with a moderate stenosis by heart catheterization in 2006. Think that we should but should proceed with heart catheterization today if possible.  We discussed the risks, benefits, options of heart catheterization.  He understands and agrees to proceed.   I have spent a total of 40 minutes with patient reviewing hospital  notes , telemetry, EKGs, labs and examining patient as well as establishing an assessment and plan that was discussed with the patient. > 50% of time was spent in direct patient care.    Thayer Headings, Brooke Bonito., MD, [facility] 01/25/2018, 2:42 PM [address]  [place] Pager [phone]

## 2018-01-25 NOTE — ED Provider Notes (Signed)
[facility] EMERGENCY DEPARTMENT Provider Note   CSN: [id number] Arrival date & time: 01/25/18  0930     History   Chief Complaint No chief complaint on file.   HPI Joshua Vargas is a 72 y.o. male.  HPI Patient started developing chest pain 3 days ago.  He has been experiencing pressure sensation over his anterior chest.  It has been worse at times.  He has had associated lightheadedness and shortness of breath periodically.  This morning the pain worsened and it felt "like someone was sitting on it".  He reports he was also feeling radiation of pain into his left arm.  EMS administered 324 of aspirin and nitroglycerin.  Reports after administration of nitroglycerin pain resolved and he no longer has chest pain.  He has no cardiac history.  She is compliant with blood pressure medications at baseline well controlled. Past Medical History:  Diagnosis Date  . Coronary artery disease   . Diabetes mellitus   . Hyperlipemia   . Hypertension     Patient Active Problem List   Diagnosis Date Noted  . Unstable angina ([place]) 01/25/2018  . Perennial and seasonal allergic rhinitis 07/31/2015  . Nasal polyps 07/31/2015  . 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 Erectile dysfunction 12/26/2012  . Diabetes mellitus ([place]) 12/17/2012  . MIXED HYPERLIPIDEMIA 05/29/2008  . ESSENTIAL HYPERTENSION, BENIGN 05/29/2008  . CORONARY ATHEROSCLEROSIS NATIVE CORONARY ARTERY 05/29/2008    Past Surgical History:  Procedure Laterality Date  . CARDIAC CATHETERIZATION    . JOINT REPLACEMENT    . TRIGGER FINGER RELEASE          Home Medications    Prior to Admission medications   Medication Sig Start Date End Date Taking? Authorizing Provider  acetaminophen (TYLENOL) 500 MG tablet Take 500 mg by mouth as needed. pain    Yes [provider]  amLODipine (NORVASC) 10 MG tablet Take 1 tablet (10 mg total) by mouth daily. 10/05/17  Yes Elayne Snare, MD  aspirin 81 MG EC tablet  Take 81 mg by mouth daily.     Yes [provider]  cabergoline (DOSTINEX) 0.5 MG tablet Take one half-tablet by mouth twice a week (on Monday and Saturday) Patient taking differently: Take 0.25 mg by mouth See admin instructions. Take one half-tablet by mouth twice a week (on Wednesday and Saturday 10/05/17  Yes Elayne Snare, MD  doxazosin (CARDURA) 2 MG tablet TAKE ONE TABLET BY MOUTH AT BEDTIME Patient taking differently: Take 2 mg by mouth daily.  09/14/15  Yes Elayne Snare, MD  glimepiride (AMARYL) 4 MG tablet TAKE 1 TABLETS BY MOUTH EVERY DAY at dinner Patient taking differently: Take 4 mg by mouth daily.  10/04/17  Yes Elayne Snare, MD  irbesartan-hydrochlorothiazide (AVALIDE) 150-12.5 MG tablet TAKE ONE TABLET BY MOUTH ONCE DAILY. Patient taking differently: Take 1 tablet by mouth daily.  10/10/17  Yes Elayne Snare, MD  LUMIGAN 0.01 % SOLN Place 1 drop into both eyes at bedtime.  12/07/16  Yes [provider]  pioglitazone-metformin (ACTOPLUS MET) 15-850 MG tablet Take 1 tablet by mouth daily. 10/05/17  Yes Elayne Snare, MD  potassium chloride (KLOR-CON M10) 10 MEQ tablet Take 1 tablet (10 mEq total) by mouth daily. 10/05/17  Yes Elayne Snare, MD  tadalafil (CIALIS) 5 MG tablet TAKE ONE TABLET BY MOUTH DAILY AS NEEDED FOR ERECTILE DYSFUNCTION Patient taking differently: Take 5 mg by mouth daily as needed for erectile dysfunction.  04/06/17  Yes Elayne Snare, MD  TRULICITY 8.11 SR/1.5XY SOPN INJECT 1 PEN INTO THE ABDOMINAL SKIN ONCE A WEEK Patient taking differently: Inject 1 pen into the skin every Monday.  10/02/17  Yes Elayne Snare, MD  Azelastine HCl 0.15 % SOLN Use one spray in each nostril twice daily. Patient not taking: Reported on 01/25/2018 02/15/17   Dara Hoyer, FNP  glucose blood (ONE TOUCH ULTRA TEST) test strip Use as instructed to check blood sugar 1 times daily E11.65 06/15/16   Elayne Snare, MD  montelukast (SINGULAIR) 10 MG tablet Take 1 tablet (10 mg total) at bedtime by  mouth. Patient not taking: Reported on 01/25/2018 02/15/17   Dara Hoyer, FNP    Family History Family History  Problem Relation Age of Onset  . Cancer Father   . Diabetes Brother   . Heart attack Brother 42    Social History Social History   Tobacco Use  . Smoking status: Never Smoker  . Smokeless tobacco: Never Used  Substance Use Topics  . Alcohol use: No  . Drug use: No     Allergies   Atorvastatin and Dextran   Review of Systems Review of Systems  10 Systems reviewed and are negative for acute change except as noted in the HPI. Physical Exam Updated Vital Signs BP (!) 129/59   Pulse 68   Resp 18   Ht 5' 9"  (1.753 m)   Wt 93.9 kg   SpO2 98%   BMI 30.57 kg/m   Physical Exam  Constitutional: He is oriented to person, place, and time. He appears well-developed and well-nourished.  HENT:  Head: Normocephalic and atraumatic.  Eyes: EOM are normal.  Neck: Neck supple.  Cardiovascular: Normal rate, regular rhythm, normal heart sounds and intact distal pulses.  Pulmonary/Chest: Effort normal and breath sounds normal.  Abdominal: Soft. Bowel sounds are normal. He exhibits no distension. There is no tenderness.  Musculoskeletal: Normal range of motion. He exhibits no edema or tenderness.  Neurological: He is alert and oriented to person, place, and time. He has normal strength. He exhibits normal muscle tone. Coordination normal. GCS eye subscore is 4. GCS verbal subscore is 5. GCS motor subscore is 6.  Patient has slight left mouth droop that is from old episode of Bell's palsy.  Skin: Skin is warm, dry and intact.  Psychiatric: He has a normal mood and affect.     ED Treatments / Results  Labs (all labs ordered are listed, but only abnormal results are displayed) Labs Reviewed  COMPREHENSIVE METABOLIC PANEL - Abnormal; Notable for the following components:      Result Value   Potassium 3.1 (*)    Glucose, Bld 101 (*)    Calcium 8.6 (*)    Total Protein  6.3 (*)    Albumin 3.3 (*)    AST 54 (*)    All other components within normal limits  HEMOGLOBIN A1C - Abnormal; Notable for the following components:   Hgb A1c MFr Bld 6.6 (*)    All other components within normal limits  LIPASE, BLOOD  CBC WITH DIFFERENTIAL/PLATELET  PROTIME-INR  MAGNESIUM  T4, FREE  TROPONIN I  TROPONIN I  TROPONIN I  HEPARIN LEVEL (UNFRACTIONATED)  TSH  HEPATIC FUNCTION PANEL  I-STAT TROPONIN, ED  I-STAT TROPONIN, ED  CBG MONITORING, ED    EKG EKG Interpretation  Date/Time:  Thursday January 25 2018 09:31:23 EDT Ventricular Rate:  71 PR Interval:    QRS Duration: 90 QT Interval:  420 QTC Calculation: 457 R  Axis:   51 Text Interpretation:  Sinus rhythm Ventricular premature complex Anteroseptal infarct, old Minimal ST depression, diffuse leads PVC but no sig change from old Confirmed by Charlesetta Shanks [phone]) on 01/25/2018 9:42:01 AM   Radiology Dg Chest 2 View  Result Date: 01/25/2018 CLINICAL DATA:  Left side chest pain, shortness of Breath EXAM: CHEST - 2 VIEW COMPARISON:  04/28/2007 FINDINGS: The heart size and mediastinal contours are within normal limits. Both lungs are clear. The visualized skeletal structures are unremarkable. IMPRESSION: Heart and mediastinal contours are within normal limits. No focal opacities or effusions. No acute bony abnormality. Electronically Signed   By: Rolm Baptise M.D.   On: 01/25/2018 11:54    Procedures Procedures (including critical care time) CRITICAL CARE Performed by: Charlesetta Shanks   Total critical care time: 20 minutes  Critical care time was exclusive of separately billable procedures and treating other patients.  Critical care was necessary to treat or prevent imminent or life-threatening deterioration.  Critical care was time spent personally by me on the following activities: development of treatment plan with patient and/or surrogate as well as nursing, discussions with consultants,  evaluation of patient's response to treatment, examination of patient, obtaining history from patient or surrogate, ordering and performing treatments and interventions, ordering and review of laboratory studies, ordering and review of radiographic studies, pulse oximetry and re-evaluation of patient's condition. Medications Ordered in ED Medications  potassium chloride SA (K-DUR,KLOR-CON) CR tablet 40 mEq (has no administration in time range)  sodium chloride flush (NS) 0.9 % injection 3 mL (has no administration in time range)  sodium chloride flush (NS) 0.9 % injection 3 mL (has no administration in time range)  0.9 %  sodium chloride infusion (has no administration in time range)  aspirin chewable tablet 81 mg (81 mg Oral Not Given 01/25/18 1506)  0.9 %  sodium chloride infusion (has no administration in time range)  acetaminophen (TYLENOL) tablet 500 mg (has no administration in time range)  amLODipine (NORVASC) tablet 10 mg (has no administration in time range)  doxazosin (CARDURA) tablet 2 mg (has no administration in time range)  latanoprost (XALATAN) 0.005 % ophthalmic solution 1 drop (has no administration in time range)  aspirin EC tablet 81 mg (has no administration in time range)  nitroGLYCERIN (NITROSTAT) SL tablet 0.4 mg (has no administration in time range)  acetaminophen (TYLENOL) tablet 650 mg (has no administration in time range)  ondansetron (ZOFRAN) injection 4 mg (has no administration in time range)  nitroGLYCERIN (NITROGLYN) 2 % ointment 0.5 inch (0.5 inches Topical Given 01/25/18 1515)  metoprolol tartrate (LOPRESSOR) tablet 25 mg (has no administration in time range)  ALPRAZolam (XANAX) tablet 0.25 mg (has no administration in time range)  insulin aspart (novoLOG) injection 0-9 Units (has no administration in time range)  insulin aspart (novoLOG) injection 0-5 Units (has no administration in time range)  rosuvastatin (CRESTOR) tablet 20 mg (has no administration in  time range)  heparin ADULT infusion 100 units/mL (25000 units/275m sodium chloride 0.45%) (1,150 Units/hr Intravenous New Bag/Given 01/25/18 1505)  heparin bolus via infusion 4,000 Units (4,000 Units Intravenous Bolus from Bag 01/25/18 1506)     Initial Impression / Assessment and Plan / ED Course  I have reviewed the triage vital signs and the nursing notes.  Pertinent labs & imaging results that were available during my care of the patient were reviewed by me and considered in my medical decision making (see chart for details).  Clinical Course as of Oct  34 9611  Thu Jan 25, 2018  1129 Consult to cardiology ordered.   [MP]  1150 Consult:Cards Master returned call and will assign for consult   [MP]    Clinical Course User Index [MP] Charlesetta Shanks, MD    Patient has had chest pains that sound concerning for cardiac etiology.  This morning, he developed chest pain with radiation to the left arm.  This was relieved by nitroglycerin administered by EMS.  Patient was given aspirin prior to arrival.  He is now chest pain-free.  Cardiology is consulted for assessment in the emergency department.  Final Clinical Impressions(s) / ED Diagnoses   Final diagnoses:  Unstable angina [facility])    ED Discharge Orders    None       Charlesetta Shanks, MD 01/25/18 1529

## 2018-01-26 ENCOUNTER — Inpatient Hospital Stay (HOSPITAL_COMMUNITY): Payer: 59

## 2018-01-26 ENCOUNTER — Encounter (HOSPITAL_COMMUNITY)
Admission: EM | Disposition: A | Discharge status: Home / Self Care | Payer: Self-pay | Source: Home / Self Care | Attending: Emergency Medicine

## 2018-01-26 ENCOUNTER — Encounter (HOSPITAL_COMMUNITY): Payer: Self-pay | Admitting: Cardiology

## 2018-01-26 DIAGNOSIS — R0789 Other chest pain: Secondary | ICD-10-CM

## 2018-01-26 DIAGNOSIS — R61 Generalized hyperhidrosis: Secondary | ICD-10-CM | POA: Diagnosis not present

## 2018-01-26 DIAGNOSIS — I10 Essential (primary) hypertension: Secondary | ICD-10-CM | POA: Diagnosis not present

## 2018-01-26 DIAGNOSIS — I25110 Atherosclerotic heart disease of native coronary artery with unstable angina pectoris: Secondary | ICD-10-CM | POA: Diagnosis not present

## 2018-01-26 DIAGNOSIS — R0600 Dyspnea, unspecified: Secondary | ICD-10-CM | POA: Diagnosis not present

## 2018-01-26 DIAGNOSIS — R079 Chest pain, unspecified: Secondary | ICD-10-CM | POA: Diagnosis not present

## 2018-01-26 HISTORY — PX: LEFT HEART CATH AND CORONARY ANGIOGRAPHY: CATH118249

## 2018-01-26 LAB — PROTIME-INR
INR: 1.12
PROTHROMBIN TIME: 14.3 s (ref 11.4–15.2)

## 2018-01-26 LAB — LIPID PANEL
Cholesterol: 188 mg/dL (ref 0–200)
HDL: 51 mg/dL (ref >40)
LDL CALC: 87 mg/dL (ref 0–99)
Total CHOL/HDL Ratio: 3.7 RATIO
Triglycerides: 252 mg/dL — ABNORMAL HIGH (ref <150)
VLDL: 50 mg/dL — AB (ref 0–40)

## 2018-01-26 LAB — BASIC METABOLIC PANEL
Anion gap: 9 (ref 5–15)
BUN: 13 mg/dL (ref 8–23)
CALCIUM: 8.8 mg/dL — AB (ref 8.9–10.3)
CO2: 27 mmol/L (ref 22–32)
CREATININE: 1.05 mg/dL (ref 0.61–1.24)
Chloride: 102 mmol/L (ref 98–111)
GFR calc Af Amer: >60 mL/min (ref >60)
Glucose, Bld: 120 mg/dL — ABNORMAL HIGH (ref 70–99)
POTASSIUM: 3.3 mmol/L — AB (ref 3.5–5.1)
SODIUM: 138 mmol/L (ref 135–145)

## 2018-01-26 LAB — GLUCOSE, CAPILLARY
GLUCOSE-CAPILLARY: 124 mg/dL — AB (ref 70–99)
Glucose-Capillary: 163 mg/dL — ABNORMAL HIGH (ref 70–99)

## 2018-01-26 LAB — CBC
HCT: 39.3 % (ref 39.0–52.0)
HEMOGLOBIN: 12.7 g/dL — AB (ref 13.0–17.0)
MCH: 27.6 pg (ref 26.0–34.0)
MCHC: 32.3 g/dL (ref 30.0–36.0)
MCV: 85.4 fL (ref 80.0–100.0)
PLATELETS: 207 10*3/uL (ref 150–400)
RBC: 4.6 MIL/uL (ref 4.22–5.81)
RDW: 13.6 % (ref 11.5–15.5)
WBC: 5.7 10*3/uL (ref 4.0–10.5)
nRBC: 0 % (ref 0.0–0.2)

## 2018-01-26 LAB — TROPONIN I: Troponin I: <0.03 ng/mL (ref <0.03)

## 2018-01-26 LAB — HEPARIN LEVEL (UNFRACTIONATED): Heparin Unfractionated: 0.5 IU/mL (ref 0.30–0.70)

## 2018-01-26 SURGERY — LEFT HEART CATH AND CORONARY ANGIOGRAPHY
Anesthesia: LOCAL

## 2018-01-26 MED ORDER — SODIUM CHLORIDE 0.9 % IV SOLN: 250.0000 mL | INTRAVENOUS | Status: DC | PRN

## 2018-01-26 MED ORDER — HEPARIN (PORCINE) IN NACL 1000-0.9 UT/500ML-% IV SOLN
INTRAVENOUS | Status: AC
  Filled 2018-01-26: qty 500

## 2018-01-26 MED ORDER — VERAPAMIL HCL 2.5 MG/ML IV SOLN
INTRAVENOUS | Status: AC
  Filled 2018-01-26: qty 2

## 2018-01-26 MED ORDER — MIDAZOLAM HCL 2 MG/2ML IJ SOLN
INTRAMUSCULAR | Status: AC
  Filled 2018-01-26: qty 2

## 2018-01-26 MED ORDER — SODIUM CHLORIDE 0.9% FLUSH: 3.0000 mL | Freq: Two times a day (BID) | INTRAVENOUS | Status: DC

## 2018-01-26 MED ORDER — FENTANYL CITRATE (PF) 100 MCG/2ML IJ SOLN
INTRAMUSCULAR | Status: DC | PRN
  Administered 2018-01-26: 25 ug via INTRAVENOUS

## 2018-01-26 MED ORDER — HEPARIN (PORCINE) IN NACL 1000-0.9 UT/500ML-% IV SOLN
INTRAVENOUS | Status: DC | PRN
  Administered 2018-01-26 (×2): 500 mL

## 2018-01-26 MED ORDER — MIDAZOLAM HCL 2 MG/2ML IJ SOLN
INTRAMUSCULAR | Status: DC | PRN
  Administered 2018-01-26: 1 mg via INTRAVENOUS

## 2018-01-26 MED ORDER — LIDOCAINE HCL (PF) 1 % IJ SOLN
INTRAMUSCULAR | Status: AC
  Filled 2018-01-26: qty 30

## 2018-01-26 MED ORDER — LIDOCAINE HCL (PF) 1 % IJ SOLN
INTRAMUSCULAR | Status: DC | PRN
  Administered 2018-01-26: 2 mL

## 2018-01-26 MED ORDER — SODIUM CHLORIDE 0.9 % WEIGHT BASED INFUSION: 1.0000 mL/kg/h | INTRAVENOUS | Status: AC

## 2018-01-26 MED ORDER — VERAPAMIL HCL 2.5 MG/ML IV SOLN
INTRAVENOUS | Status: DC | PRN
  Administered 2018-01-26: 10 mL via INTRA_ARTERIAL

## 2018-01-26 MED ORDER — SALINE SPRAY 0.65 % NA SOLN
1.0000 | NASAL | Status: DC | PRN
  Filled 2018-01-26: qty 44

## 2018-01-26 MED ORDER — HEPARIN SODIUM (PORCINE) 1000 UNIT/ML IJ SOLN
INTRAMUSCULAR | Status: DC | PRN
  Administered 2018-01-26: 5000 [IU] via INTRAVENOUS

## 2018-01-26 MED ORDER — SODIUM CHLORIDE 0.9% FLUSH: 3.0000 mL | INTRAVENOUS | Status: DC | PRN

## 2018-01-26 MED ORDER — POTASSIUM CHLORIDE CRYS ER 20 MEQ PO TBCR
40.0000 meq | EXTENDED_RELEASE_TABLET | ORAL | Status: AC
  Administered 2018-01-26 (×2): 40 meq via ORAL
  Filled 2018-01-26 (×2): qty 2

## 2018-01-26 MED ORDER — HEPARIN SODIUM (PORCINE) 1000 UNIT/ML IJ SOLN
INTRAMUSCULAR | Status: AC
  Filled 2018-01-26: qty 1

## 2018-01-26 MED ORDER — FENTANYL CITRATE (PF) 100 MCG/2ML IJ SOLN
INTRAMUSCULAR | Status: AC
  Filled 2018-01-26: qty 2

## 2018-01-26 MED ORDER — IOHEXOL 350 MG/ML SOLN
INTRAVENOUS | Status: DC | PRN
  Administered 2018-01-26: 80 mL via INTRA_ARTERIAL

## 2018-01-26 MED ORDER — ROSUVASTATIN CALCIUM 20 MG PO TABS: 20.0000 mg | ORAL_TABLET | Freq: Every day | ORAL | 3 refills | Status: DC

## 2018-01-26 SURGICAL SUPPLY — 12 items

## 2018-01-26 NOTE — Progress Notes (Signed)
ANTICOAGULATION CONSULT NOTE - Follow Up Consult  Pharmacy Consult for heparin Indication: chest pain/ACS  Labs: Recent Labs    01/25/18 1022 01/25/18 1452 01/25/18 2012 01/25/18 2301  HGB 13.6  --   --   --   HCT 43.5  --   --   --   PLT 215  --   --   --   LABPROT 13.8  --   --   --   INR 1.07  --   --   --   HEPARINUNFRC  --   --   --  0.27*  CREATININE 1.08  --   --   --   TROPONINI  --  <0.03 <0.03  --     Assessment: 72yo male slightly subtherapeutic on heparin with initial dosing for CP.  Goal of Therapy:  Heparin level 0.3-0.7 units/ml   Plan:  Will increase heparin gtt by 1 units/kg/hr to 1250 units/hr and check level in 6 hours.    Wynona Neat, PharmD, BCPS  01/26/2018,12:18 AM

## 2018-01-26 NOTE — Care Management CC44 (Signed)
Condition Code 44 Documentation Completed  Patient Details  Name: Joshua Vargas MRN: [id number] Date of Birth: 06/18/1945   Condition Code 44 given:  Yes Patient signature on Condition Code 44 notice:  Yes Documentation of 2 MD's agreement:  Yes Code 44 added to claim:  Yes    Dawayne Patricia, RN 01/26/2018, 4:10 PM

## 2018-01-26 NOTE — Progress Notes (Signed)
ANTICOAGULATION CONSULT NOTE - Follow Up Consult  Pharmacy Consult for heparin Indication: chest pain/ACS  Allergies  Allergen Reactions  . Atorvastatin Itching  . Dextran Rash    Other Reaction: Not Assessed    Patient Measurements: Height: 5\' 9"  (175.3 cm) Weight: 206 lb 12.7 oz (93.8 kg) IBW/kg (Calculated) : 70.7 Heparin Dosing Weight: 90 kg  Vital Signs: Temp: 97.8 F (36.6 C) (10/18 0451) Temp Source: Oral (10/18 0451) BP: 124/72 (10/18 0451)  Labs: Recent Labs    01/25/18 1022 01/25/18 1452 01/25/18 2012 01/25/18 2301 01/26/18 0223 01/26/18 0636  HGB 13.6  --   --   --  12.7*  --   HCT 43.5  --   --   --  39.3  --   PLT 215  --   --   --  207  --   LABPROT 13.8  --   --   --  14.3  --   INR 1.07  --   --   --  1.12  --   HEPARINUNFRC  --   --   --  0.27*  --  0.50  CREATININE 1.08  --   --   --  1.05  --   TROPONINI  --  <0.03 <0.03  --  <0.03  --    Estimated Creatinine Clearance: 72.9 mL/min (by C-G formula based on SCr of 1.05 mg/dL).  Assessment: 72 yo M presents with CP. Pharmacy consulted to start heparin for ACS. No anticoag PTA.  Heparin level came back therapeutic at 0.5, on 1250 units/hr. Hgb 12.7, plt 207. No s/sx of bleeding or infusion issues documented.   Goal of Therapy:  Heparin level 0.3-0.7 units/ml Monitor platelets by anticoagulation protocol: Yes   Plan:  Continue heparin infusion at 1,250 units/hr Monitor daily heparin level, CBC, s/s of bleed F/u after cardiac cath  Doylene Canard, PharmD Clinical Pharmacist  Pager: [phone] Phone: [phone] 01/26/2018,7:21 AM

## 2018-01-26 NOTE — Care Management Obs Status (Signed)
[facility] NOTIFICATION   Patient Details  Name: Randen Kauth Kincade MRN: [id number] Date of Birth: January 06, 1946   Medicare Observation Status Notification Given:       Dawayne Patricia, RN 01/26/2018, 4:10 PM

## 2018-01-26 NOTE — Progress Notes (Addendum)
Progress Note  Patient Name: Joshua Vargas Date of Encounter: 01/26/2018  Primary Cardiologist: Mertie Moores, MD   Subjective   The patient had his cath this morning and has TR band present on his right wrist.  He denies chest pain or shortness of breath this morning. He is having constant, dull left arm discomfort.  Inpatient Medications    Scheduled Meds: . amLODipine  10 mg Oral Daily  . aspirin EC  81 mg Oral Daily  . doxazosin  2 mg Oral QHS  . Influenza vac split quadrivalent PF  0.5 mL Intramuscular Tomorrow-1000  . insulin aspart  0-5 Units Subcutaneous QHS  . insulin aspart  0-9 Units Subcutaneous TID WC  . latanoprost  1 drop Both Eyes QHS  . metoprolol tartrate  25 mg Oral BID  . nitroGLYCERIN  0.5 inch Topical Q6H  . potassium chloride  40 mEq Oral Q2H  . rosuvastatin  20 mg Oral q1800  . sodium chloride flush  3 mL Intravenous Q12H   Continuous Infusions: . sodium chloride    . sodium chloride     PRN Meds: sodium chloride, acetaminophen, ALPRAZolam, nitroGLYCERIN, ondansetron (ZOFRAN) IV, sodium chloride, sodium chloride flush   Vital Signs    Vitals:   01/26/18 0823 01/26/18 0828 01/26/18 0833 01/26/18 0838  BP: 136/71 128/68 132/64 131/69  Pulse: 65 64 70 60  Resp: 20 15 14 13   Temp:      TempSrc:      SpO2: 100% 99% 99% 99%  Weight:      Height:        Intake/Output Summary (Last 24 hours) at 01/26/2018 0905 Last data filed at 01/26/2018 0500 Gross per 24 hour  Intake 1924.85 ml  Output -  Net 1924.85 ml   Filed Weights   01/25/18 0948 01/25/18 1539  Weight: 93.9 kg 93.8 kg    Telemetry    Sinus rhythm with occasional PVCs- Personally Reviewed  ECG    Normal sinus rhythm with increased voltage consistent with LVH, 60 bpm, QTC 480- Personally Reviewed  Physical Exam   GEN: No acute distress.   Neck: No JVD Cardiac: RRR, no murmurs, rubs, or gallops.  Respiratory: Clear to auscultation bilaterally. GI: Soft, nontender,  non-distended  MS: No edema; No deformity. Neuro:  Nonfocal  Psych: Normal affect   Labs    Chemistry Recent Labs  Lab 01/25/18 1022 01/25/18 1536 01/26/18 0223  NA 139  --  138  K 3.1*  --  3.3*  CL 105  --  102  CO2 25  --  27  GLUCOSE 101*  --  120*  BUN 11  --  13  CREATININE 1.08  --  1.05  CALCIUM 8.6*  --  8.8*  PROT 6.3* 7.5  --   ALBUMIN 3.3* 3.9  --   AST 54* 70*  --   ALT 35 46*  --   ALKPHOS 50 60  --   BILITOT 0.5 0.7  --   GFRNONAA >60  --  >60  GFRAA >60  --  >60  ANIONGAP 9  --  9     Hematology Recent Labs  Lab 01/25/18 1022 01/26/18 0223  WBC 4.1 5.7  RBC 5.04 4.60  HGB 13.6 12.7*  HCT 43.5 39.3  MCV 86.3 85.4  MCH 27.0 27.6  MCHC 31.3 32.3  RDW 13.6 13.6  PLT 215 207    Cardiac Enzymes Recent Labs  Lab 01/25/18 1452 01/25/18 2012 01/26/18 0223  TROPONINI <0.03 <0.03 <0.03    Recent Labs  Lab 01/25/18 1039 01/25/18 1402  TROPIPOC 0.00 0.01     BNPNo results for input(s): BNP, PROBNP in the last 168 hours.   DDimer No results for input(s): DDIMER in the last 168 hours.   Radiology    Dg Chest 2 View  Result Date: 01/25/2018 CLINICAL DATA:  Left side chest pain, shortness of Breath EXAM: CHEST - 2 VIEW COMPARISON:  04/28/2007 FINDINGS: The heart size and mediastinal contours are within normal limits. Both lungs are clear. The visualized skeletal structures are unremarkable. IMPRESSION: Heart and mediastinal contours are within normal limits. No focal opacities or effusions. No acute bony abnormality. Electronically Signed   By: Rolm Baptise M.D.   On: 01/25/2018 11:54    Cardiac Studies   LEFT HEART CATH AND CORONARY ANGIOGRAPHY 01/26/18  Conclusion    1st Diag lesion is 70% stenosed.  Prox LAD lesion is 25% stenosed.  The left ventricular systolic function is normal.  LV end diastolic pressure is normal.  The left ventricular ejection fraction is 55-65% by visual estimate.   1. Single vessel obstructive CAD  with moderate stenosis in a small diagonal branch. This is unchanged since 2006. 2. Normal LV function 3. Normal LVEDP  Plan: medical management. Consider other causes of his symptoms.  Recommend Aspirin 81mg  daily for moderate CAD.      Patient Profile     72 y.o. male  with DM, HLD, HTN and prior cardiac cath in 2006 with diag disease.  Now presents with chest pain with mild dyspnea and diaphoresis relieved by SL NTG X3.   Assessment & Plan    Chest pain -suspicious for myocardial ischemia with prior known CAD.  -Troponins negative x3 -EKG without ischemic changes -Due to his history and worrisome symptoms the patient was taken for cardiac catheterization this morning that showed single-vessel obstructive CAD with moderate stenosis and a small diagonal branch, unchanged from 2006.  He had normal LV function and normal LVEDP. -He will be continued on medical management.  Consideration of other causes for his symptoms -Continue aspirin -A differential diagnosis could include GERD.  Will initiate a trial of Protonix 40 mg daily for 1 month -He will likely be ready for discharge home today once his TR band is removed.  He would like to be discharged by lunch.  -Continue aggressive risk factor modification.  Will arrange for routine cardiology follow-up in our office with Dr. Acie Fredrickson.  Hypertension -Blood pressures well controlled.  We will continue amlodipine 10 mg daily and Irbesartan-hydrochlorothiazide 150-12.5 mg daily  Hypokalemia -The patient was given K-Dur 40 mEq x 1 yesterday.  He will be given 40 mEq.  Serum potassium is 3.3 this morning.  K-Dur 40 mEq x 2 today has been ordered  Hyperlipidemia -LDL 87.  He is not on a statin, atorvastatin was discontinued due to elevated liver enzymes -Liver enzymes are mildly elevated with AST of 70 and ALT of 46 -Advised on heart healthy diet and exercise  For questions or updates, please contact [facility] Please consult  [email or web address] for contact info under       Signed, Daune Perch, NP  01/26/2018, 9:05 AM    Attending Note:   The patient was seen and examined.  Agree with assessment and plan as noted above.  Changes made to the above note as needed.  Patient seen and independently examined with  Pecolia Ades, NP .   We discussed all aspects  of the encounter. I agree with the assessment and plan as stated above.  1.   Chest pain: Patient has a history of coronary artery disease.  Heart catheterization today revealed stable coronary angiograms.  His stents are widely patent.  He is not having any further episodes of chest pain.  Troponin levels remain negative.  2.  Hyperlipidemia: His total cholesterol is 188.  LDL is 87.  Triglyceride level is 252. He has an allergy to atorvastatin ( itching ) . He states that his atorvastatin was discontinued because of elevated liver enzymes.   He is on Rosuvastatin   Pt is stable for DC  LV function is normal, troponins are negative  Will cancel echo     I have spent a total of 40 minutes with patient reviewing hospital  notes , telemetry, EKGs, labs and examining patient as well as establishing an assessment and plan that was discussed with the patient. > 50% of time was spent in direct patient care.    Thayer Headings, Brooke Bonito., MD, [facility] 01/26/2018, 11:48 AM [address]  [place] Pager [phone]

## 2018-01-26 NOTE — Interval H&P Note (Signed)
History and Physical Interval Note:  01/26/2018 8:16 AM  Joshua Vargas  has presented today for surgery, with the diagnosis of ua  The various methods of treatment have been discussed with the patient and family. After consideration of risks, benefits and other options for treatment, the patient has consented to  Procedure(s): LEFT HEART CATH AND CORONARY ANGIOGRAPHY (N/A) as a surgical intervention .  The patient's history has been reviewed, patient examined, no change in status, stable for surgery.  I have reviewed the patient's chart and labs.  Questions were answered to the patient's satisfaction.   Cath Lab Visit (complete for each Cath Lab visit)  Clinical Evaluation Leading to the Procedure:   ACS: Yes.    Non-ACS:    Anginal Classification: CCS III  Anti-ischemic medical therapy: Maximal Therapy (2 or more classes of medications)  Non-Invasive Test Results: No non-invasive testing performed  Prior CABG: No previous CABG        Joshua Vargas [facility] 01/26/2018 8:16 AM

## 2018-01-26 NOTE — Discharge Summary (Addendum)
Discharge Summary    Patient ID: Joshua Vargas MRN: [id number]; DOB: Oct 05, 1945  Admit date: 01/25/2018 Discharge date: 01/26/2018  Primary Care Provider: Seward Carol, MD  Primary Cardiologist: Mertie Moores, MD   Discharge Diagnoses    Active Problems:   Unstable angina [facility])   Chest pain   Allergies Allergies  Allergen Reactions  . Atorvastatin Itching  . Dextran Rash    Other Reaction: Not Assessed    Diagnostic Studies/Procedures    LEFT HEART CATH AND CORONARY ANGIOGRAPHY 01/26/18  Conclusion    1st Diag lesion is 70% stenosed.  Prox LAD lesion is 25% stenosed.  The left ventricular systolic function is normal.  LV end diastolic pressure is normal.  The left ventricular ejection fraction is 55-65% by visual estimate.   1. 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 This is unchanged since 2006. 2. Normal LV function 3. Normal LVEDP  Plan: medical management. Consider other causes of his symptoms.  Recommend Aspirin 42m daily for moderate CAD.    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 male with DM. HLD, HTN and prior cardiac cath in 2006 with diag disease.  He presented with a 3-day history of left anterior chest pain and at night the pain would radiate to his left arm.  He had some mild dyspnea and diaphoresis.  He had dizziness at one point while driving and felt like he could pass out.  He also noted some skipped heartbeats.  On the day of presentation his chest pain was worse, sharp with heavy pressure.  He was at work and he called EMS.  He took a baby aspirin and nitroglycerin for a total of 3 doses with relief of pain.  He was admitted for further work-up of chest pain suspicious for myocardial ischemia.  Hospital Course     Consultants: None  His EKG showed mild ST depression and PVCs.  Troponins were negative x3.  He has had no  further chest pain.  He was taken for cardiac catheterization this morning that showed single-vessel obstructive CAD with moderate stenosis and a small diagonal branch, unchanged from 2006.  He had normal LV function and normal LVEDP. Will continue aspirin.   His total cholesterol is 188.  LDL is 87.  Triglyceride level is 252. He has an allergy to atorvastatin ( itching ). He states that his atorvastatin was discontinued because of elevated liver enzymes.  He has been started on Rosuvastatin.  He will need follow-up lipid levels and liver function in 6 weeks.  He will follow-up with his primary care physician for further evaluation of his arm pain.  Patient has been seen by Dr. NAcie Fredricksontoday and deemed ready for discharge home. All follow up appointments have been scheduled. Discharge medications are listed below. _____________  Discharge Vitals Blood pressure 137/73, pulse 63, temperature 97.6 F (36.4 C), temperature source Oral, resp. rate 18, height 5' 9"  (1.753 m), weight 93.8 kg, SpO2 99 %.  Filed Weights   01/25/18 0948 01/25/18 1539  Weight: 93.9 kg 93.8 kg    Labs & Radiologic Studies    CBC Recent Labs    01/25/18 1022 01/26/18 0223  WBC 4.1 5.7  NEUTROABS 2.0  --   HGB 13.6 12.7*  HCT 43.5 39.3  MCV 86.3 85.4  PLT 215 2009  Basic Metabolic Panel Recent Labs  01/25/18 1022 01/25/18 1452 01/26/18 0223  NA 139  --  138  K 3.1*  --  3.3*  CL 105  --  102  CO2 25  --  27  GLUCOSE 101*  --  120*  BUN 11  --  13  CREATININE 1.08  --  1.05  CALCIUM 8.6*  --  8.8*  MG  --  1.8  --    Liver Function Tests Recent Labs    01/25/18 1022 01/25/18 1536  AST 54* 70*  ALT 35 46*  ALKPHOS 50 60  BILITOT 0.5 0.7  PROT 6.3* 7.5  ALBUMIN 3.3* 3.9   Recent Labs    01/25/18 1022  LIPASE 37   Cardiac Enzymes Recent Labs    01/25/18 1452 01/25/18 2012 01/26/18 0223  TROPONINI <0.03 <0.03 <0.03   BNP Invalid input(s): POCBNP D-Dimer No results for  input(s): DDIMER in the last 72 hours. Hemoglobin A1C Recent Labs    01/25/18 1454  HGBA1C 6.6*   Fasting Lipid Panel Recent Labs    01/26/18 0223  CHOL 188  HDL 51  LDLCALC 87  TRIG 252*  CHOLHDL 3.7   Thyroid Function Tests Recent Labs    01/25/18 1452  TSH 1.444   _____________  Dg Chest 2 View  Result Date: 01/25/2018 CLINICAL DATA:  Left side chest pain, shortness of Breath EXAM: CHEST - 2 VIEW COMPARISON:  04/28/2007 FINDINGS: The heart size and mediastinal contours are within normal limits. Both lungs are clear. The visualized skeletal structures are unremarkable. IMPRESSION: Heart and mediastinal contours are within normal limits. No focal opacities or effusions. No acute bony abnormality. Electronically Signed   By: Rolm Baptise M.D.   On: 01/25/2018 11:54   Disposition   Pt is being discharged home today in good condition.  Follow-up Plans & Appointments    Follow-up Information    Daune Perch, NP Follow up.   Specialty:  Nurse Practitioner Why:  Routine cardiology care establishment appoinment on 03/20/18 at 8:30. Will check fasting labs.  Contact information: [address] [place] [address] [phone]          Discharge Instructions    Diet - low sodium heart healthy   Complete by:  As directed    Discharge instructions   Complete by:  As directed    Can resume actoplus (metformin) on Sunday (hold for 48 hours after cath).   You can try over the counter prilosec to reduce stomach acid as needed.    PLEASE REMEMBER TO BRING ALL OF YOUR MEDICATIONS TO EACH OF YOUR FOLLOW-UP OFFICE VISITS.  PLEASE ATTEND ALL SCHEDULED FOLLOW-UP APPOINTMENTS.   Activity: Increase activity slowly as tolerated. You may shower, but no soaking baths (or swimming) for 1 week.  Wound Care: You may wash cath site gently with soap and water. Keep cath site clean and dry. If you notice pain, swelling, bleeding or pus at your cath site, please call  [phone].   Increase activity slowly   Complete by:  As directed       Discharge Medications   Allergies as of 01/26/2018      Reactions   Atorvastatin Itching   Dextran Rash   Other Reaction: Not Assessed      Medication List    TAKE these medications   acetaminophen 500 MG tablet Commonly known as:  TYLENOL Take 500 mg by mouth as needed. pain   amLODipine 10 MG tablet Commonly known as:  NORVASC  Take 1 tablet (10 mg total) by mouth daily.   aspirin 81 MG EC tablet Take 81 mg by mouth daily.   Azelastine HCl 0.15 % Soln Use one spray in each nostril twice daily.   cabergoline 0.5 MG tablet Commonly known as:  DOSTINEX Take one half-tablet by mouth twice a week (on Monday and Saturday) What changed:    how much to take  how to take this  when to take this  additional instructions   doxazosin 2 MG tablet Commonly known as:  CARDURA TAKE ONE TABLET BY MOUTH AT BEDTIME What changed:  when to take this   glimepiride 4 MG tablet Commonly known as:  AMARYL TAKE 1 TABLETS BY MOUTH EVERY DAY at dinner What changed:    how much to take  how to take this  when to take this  additional instructions   glucose blood test strip Use as instructed to check blood sugar 1 times daily E11.65   irbesartan-hydrochlorothiazide 150-12.5 MG tablet Commonly known as:  AVALIDE TAKE ONE TABLET BY MOUTH ONCE DAILY. What changed:    how much to take  how to take this  when to take this  additional instructions   LUMIGAN 0.01 % Soln Generic drug:  bimatoprost Place 1 drop into both eyes at bedtime.   montelukast 10 MG tablet Commonly known as:  SINGULAIR Take 1 tablet (10 mg total) at bedtime by mouth.   pioglitazone-metformin 15-850 MG tablet Commonly known as:  ACTOPLUS MET Take 1 tablet by mouth daily.   potassium chloride 10 MEQ tablet Commonly known as:  K-DUR,KLOR-CON Take 1 tablet (10 mEq total) by mouth daily.   rosuvastatin 20 MG  tablet Commonly known as:  CRESTOR Take 1 tablet (20 mg total) by mouth daily at 6 PM.   tadalafil 5 MG tablet Commonly known as:  CIALIS TAKE ONE TABLET BY MOUTH DAILY AS NEEDED FOR ERECTILE DYSFUNCTION What changed:  See the new instructions.   TRULICITY 3.82 NK/5.3ZJ Sopn Generic drug:  Dulaglutide INJECT 1 PEN INTO THE ABDOMINAL SKIN ONCE A WEEK What changed:  See the new instructions.        Acute coronary syndrome (MI, NSTEMI, STEMI, etc) this admission?: No.    Outstanding Labs/Studies   Lipid panel and LFTs in 6 weeks  Duration of Discharge Encounter   Greater than 30 minutes including physician time.  Signed, Daune Perch, NP 01/26/2018, 12:15 PM  Attending Note:   The patient was seen and examined.  Agree with assessment and plan as noted above.  Changes made to the above note as needed.  Patient seen and independently examined with  Pecolia Ades, NP .   We discussed all aspects of the encounter. I agree with the assessment and plan as stated above.  1.   Chest pain: Patient has a history of coronary artery disease.  Heart catheterization today revealed stable coronary angiograms.  His stents are widely patent.  He is not having any further episodes of chest pain.  Troponin levels remain negative.  2.  Hyperlipidemia: His total cholesterol is 188.  LDL is 87.  Triglyceride level is 252. He has an allergy to atorvastatin ( itching ) . He states that his atorvastatin was discontinued because of elevated liver enzymes.   He is on Rosuvastatin   Pt is stable for DC  LV function is normal, troponins are negative  Will cancel echo     I have spent a total of 40 minutes with patient  reviewing hospital  notes , telemetry, EKGs, labs and examining patient as well as establishing an assessment and plan that was discussed with the patient. > 50% of time was spent in direct patient care.    Thayer Headings, Brooke Bonito., MD, [facility] 01/26/2018, 11:48 AM [address]  [place] Pager [phone]

## 2018-01-26 NOTE — Progress Notes (Signed)
01/26/2018 1400 Discharge AVS meds take and those due reviewed with pt. Follow up appointments and when to call MD reviewed. All questions and concerns addressed. No further questions at this time. D/c IV and TELE, CCMD notified. D/C home per orders. Brought down via wheelchair with staff and family.  Amanda Cockayne, RN

## 2018-02-22 ENCOUNTER — Other Ambulatory Visit: Payer: Self-pay | Admitting: Endocrinology

## 2018-03-01 ENCOUNTER — Encounter: Payer: Self-pay | Admitting: Cardiology

## 2018-03-03 ENCOUNTER — Other Ambulatory Visit: Payer: Self-pay | Admitting: Endocrinology

## 2018-03-07 ENCOUNTER — Ambulatory Visit (INDEPENDENT_AMBULATORY_CARE_PROVIDER_SITE_OTHER): Payer: 59 | Admitting: Allergy

## 2018-03-07 ENCOUNTER — Encounter: Payer: Self-pay | Admitting: Allergy

## 2018-03-07 VITALS — BP 150/78 | HR 77 | Temp 98.2°F | Resp 16 | Ht 70.0 in | Wt 210.6 lb

## 2018-03-07 DIAGNOSIS — Z794 Long term (current) use of insulin: Secondary | ICD-10-CM

## 2018-03-07 DIAGNOSIS — J3089 Other allergic rhinitis: Secondary | ICD-10-CM

## 2018-03-07 DIAGNOSIS — E119 Type 2 diabetes mellitus without complications: Secondary | ICD-10-CM

## 2018-03-07 DIAGNOSIS — J339 Nasal polyp, unspecified: Secondary | ICD-10-CM

## 2018-03-07 MED ORDER — METHYLPREDNISOLONE ACETATE 80 MG/ML IJ SUSP
80.0000 mg | Freq: Once | INTRAMUSCULAR | Status: AC
  Administered 2018-03-07: 80 mg via INTRAMUSCULAR

## 2018-03-07 MED ORDER — FLUTICASONE PROPIONATE 93 MCG/ACT NA EXHU: 2.0000 | INHALANT_SUSPENSION | Freq: Two times a day (BID) | NASAL | 5 refills | Status: DC

## 2018-03-07 MED ORDER — METHYLPREDNISOLONE ACETATE 80 MG/ML IJ SUSP: 80.0000 mg | Freq: Once | INTRAMUSCULAR | Status: DC

## 2018-03-07 NOTE — Progress Notes (Signed)
Follow-up Note  RE: Joshua Vargas MRN: [id number] DOB: 07-Nov-1945 Date of Office Visit: 03/07/2018   History of present illness: Joshua Vargas is a 72 y.o. male presenting today for follow-up of allergic rhinitis and he also has nasal polyposis as well as diabetes.  He was last seen in the office on February 15, 2017 by our nurse practitioner Joshua Vargas.  He states that the last several visits he has not had quite good relief of his nasal congestion.  He states the visit that he came in early 2017 he had better results.  Upon review of what was performed at that visit he did have a Depo-Medrol injection given as well as a prednisone pack for 5 days.  He also was advised to use Qnasl at that time.  At his past visits he has been given Depo-Medrol injection and states that it does not seem to work as well as the combination of the injection plus the oral steroid.  He also had been recommended to take Flonase which she does not feel helps.  He also feels that as a lasting also does not work this he stopped this.  He was taking Singulair and does not feel that this was helpful either so he no longer takes that medicine either.  He also states that he has never found an antihistamine that is been effective. He does have history of nasal polyps however he does report that he can smell and taste to a degree.  He states that this nasal congestion is worse at night and that his wife is constantly telling him that he is making a lot of noise of trying to clear his nose.  He states that he does pretty okay during the summer but the colder months are a lot worse. He does have diabetes and he follows closely with his PCP and he is on several diabetic medications including Trulicity. He otherwise denies any changes to his health and denies any surgeries or hospitalizations of this past visit.  Review of systems: Review of Systems  Constitutional: Negative for chills, fever and malaise/fatigue.  HENT:  Positive for congestion and sinus pain. Negative for ear discharge, ear pain, nosebleeds and sore throat.   Eyes: Negative for pain, discharge and redness.  Respiratory: Negative for cough, shortness of breath and wheezing.   Cardiovascular: Negative for chest pain.  Gastrointestinal: Negative for abdominal pain, constipation, diarrhea, heartburn, nausea and vomiting.  Musculoskeletal: Negative for joint pain.  Skin: Negative for itching and rash.  Neurological: Negative for headaches.    All other systems negative unless noted above in HPI  Past medical/social/surgical/family history have been reviewed and are unchanged unless specifically indicated below.  No changes  Medication List: Allergies as of 03/07/2018      Reactions   Atorvastatin Itching   Dextran Rash   Other Reaction: Not Assessed      Medication List        Accurate as of 03/07/18  4:45 PM. Always use your most recent med list.          acetaminophen 500 MG tablet Commonly known as:  TYLENOL Take 500 mg by mouth as needed. pain   amLODipine 10 MG tablet Commonly known as:  NORVASC Take 1 tablet (10 mg total) by mouth daily.   aspirin 81 MG EC tablet Take 81 mg by mouth daily.   cabergoline 0.5 MG tablet Commonly known as:  DOSTINEX Take one half-tablet by mouth twice a week (  on Monday and Saturday)   doxazosin 2 MG tablet Commonly known as:  CARDURA TAKE ONE TABLET BY MOUTH AT BEDTIME   Fluticasone Propionate 93 MCG/ACT Exhu Place 2 sprays into the nose 2 (two) times daily.   glimepiride 4 MG tablet Commonly known as:  AMARYL TAKE 1 TABLETS BY MOUTH EVERY DAY AT DINNER   glucose blood test strip Use as instructed to check blood sugar 1 times daily E11.65   irbesartan-hydrochlorothiazide 150-12.5 MG tablet Commonly known as:  AVALIDE TAKE ONE TABLET BY MOUTH ONCE DAILY.   LUMIGAN 0.01 % Soln Generic drug:  bimatoprost Place 1 drop into both eyes at bedtime.   pioglitazone-metformin  15-850 MG tablet Commonly known as:  ACTOPLUS MET Take 1 tablet by mouth daily.   potassium chloride 10 MEQ tablet Commonly known as:  K-DUR,KLOR-CON Take 1 tablet (10 mEq total) by mouth daily.   rosuvastatin 20 MG tablet Commonly known as:  CRESTOR Take 1 tablet (20 mg total) by mouth daily at 6 PM.   tadalafil 5 MG tablet Commonly known as:  CIALIS TAKE ONE TABLET BY MOUTH DAILY AS NEEDED FOR ERECTILE DYSFUNCTION   TRULICITY 3.23 FT/7.3UK Sopn Generic drug:  Dulaglutide INJECT 1 PEN INTO THE ABDOMINAL SKIN ONCE A WEEK       Known medication allergies: Allergies  Allergen Reactions  . Atorvastatin Itching  . Dextran Rash    Other Reaction: Not Assessed     Physical examination: Blood pressure (!) 150/78, pulse 77, temperature 98.2 F (36.8 C), temperature source Oral, resp. rate 16, height 5' 10"  (1.778 m), weight 210 lb 9.6 oz (95.5 kg), SpO2 96 %.  General: Alert, interactive, in no acute distress. HEENT: PERRLA, TMs pearly gray, turbinates markedly edematous with clear discharge, not able to visualize any polyps, post-pharynx non erythematous. Neck: Supple without lymphadenopathy. Lungs: Clear to auscultation without wheezing, rhonchi or rales. {no increased work of breathing. CV: Normal S1, S2 without murmurs. Abdomen: Nondistended, nontender. Skin: Warm and dry, without lesions or rashes. Extremities:  No clubbing, cyanosis or edema. Neuro:   Grossly intact.  Diagnositics/Labs: He was provided with a Depo-Medrol injection.  Tolerated this well.  Assessment and plan:   Perennial and seasonal allergic rhinitis  Continue allergen avoidance measures  Will have you start Xhance nasal spray device as below  recommended nasal saline spray (i.e. Simply Saline) as needed prior to medicated nasal sprays.  Chronic Rhinosinusitis with Nasal polyps  Depo-Medrol 55m was administered in the office.  Prednisone provided and to be started tomorrow as follows: 20  mg daily x 4 days, 10 mg x1 day, then stop.  Xhance nasal spray device is Fluticasone steroid base.  Xhance allows for deeper deposition of nasal spray into your sinuses for better efficacy.  Use 2 sprays each nostril twice a day.  Demonstrated proper nasal spray technique today.   Also discussed Dupixent as added on therapy for polyp control.  Dupixent is a injectable medication for control of chronic rhinosinusitis with nasal polyps that is given every 2 weeks at home.  Informational brochure provided.  Will consider if XTruett Pernais not effective enough.  We discussed further benefits and risk of Dupixent at next visit if needed.  Diabetes mellitus   Monitor blood glucose levels while on prednisone.  He has verbalized understanding and has agreed to do so.   Return in about 3-4 months or sooner if symptoms worsen or fail to improve.  I appreciate the opportunity to take part in Joshua Vargas's  care. Please do not hesitate to contact me with questions.  Sincerely,   Prudy Feeler, MD Allergy/Immunology [facility]

## 2018-03-07 NOTE — Patient Instructions (Signed)
Perennial and seasonal allergic rhinitis  Continue allergen avoidance measures  Will have you start Xhance nasal spray device as below  I have also recommended nasal saline spray (i.e. Simply Saline) as needed prior to medicated nasal sprays.  Chronic Rhinosinusitis with Nasal polyps  Depo-Medrol 80 mg was administered in the office.  Prednisone has been provided and is to be started tomorrow as follows: 20 mg daily x 4 days, 10 mg x1 day, then stop.  Xhance nasal spray device is Fluticasone steroid base.  Xhance allows for deeper deposition of nasla spray into your sinuses for better efficacy.  Use 2 sprays each nostril twice a day.  Demonstrated proper nasal spray technique today.   Also discussed Dupixent as added on therapy for polyp control.  Dupixent is a injectable medication for control of chronic rhinosinusitis with nasal polyps that is given every 2 weeks at home.  Informational brochure provided.  Will consider if Truett Perna is not effective enough.    Diabetes mellitus   Monitor blood glucose levels while on prednisone.  He has verbalized understanding and has agreed to do so.   Return in about 3-4 months or sooner if symptoms worsen or fail to improve.

## 2018-03-20 ENCOUNTER — Ambulatory Visit: Payer: 59 | Admitting: Cardiology

## 2018-03-20 NOTE — Progress Notes (Deleted)
Cardiology Office Note:    Date:  03/20/2018   ID:  Joshua Vargas, DOB 1945-06-26, MRN [id number]  PCP:  Seward Carol, MD  Cardiologist:  Mertie Moores, MD  Referring MD: Seward Carol, MD   No chief complaint on file. ***  History of Present Illness:    Joshua Vargas is a 72 y.o. male with a past medical history significant for diabetes type 2, hyperlipidemia, hypertension and CAD.  He was hospitalized and 01/2018 for evaluation of chest discomfort with mild dyspnea and diaphoresis.  He ruled out for MI and was taken for cardiac catheterization on 01/26/2018 and found to have single-vessel obstructive CAD with moderate stenosis in a small diagonal branch, unchanged from 2006.  He had normal LV function and normal LVEDP.  He was continued on aspirin.  It was noted that he has a prior allergy (itching) to atorvastatin and he was started on rosuvastatin.  Plan for follow-up lipids and liver function test in 6 weeks.  Past Medical History:  Diagnosis Date  . Coronary artery disease   . 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 Hyperlipemia   . Hypertension   . Pneumonia 1990s X 1; ~ 2000 X 1  . Type II diabetes mellitus ([place])     Past Surgical History:  Procedure Laterality Date  . CARDIAC CATHETERIZATION  ?2006; 01/25/2018  . ELBOW SURGERY Left    "something to do w/the muscle"  . JOINT REPLACEMENT    . LEFT HEART CATH AND CORONARY ANGIOGRAPHY N/A 01/26/2018   Procedure: LEFT HEART CATH AND CORONARY ANGIOGRAPHY;  [place]: Martinique, Peter M, MD;  Location: [facility];  Service: Cardiovascular;  Laterality: N/A;  . PICC LINE INSERTION  ~ 2005   "took it out months later"  . 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 TOTAL KNEE ARTHROPLASTY Left 2005  . TRIGGER FINGER RELEASE Bilateral ~ 2016    Current Medications: No outpatient medications have been marked as taking for the 03/20/18 encounter (Appointment) with Daune Perch, NP.   Current Facility-Administered Medications for the 03/20/18 encounter (Appointment) with Daune Perch, NP  Medication  . methylPREDNISolone acetate (DEPO-MEDROL) injection 80 mg     Allergies:   Atorvastatin and Dextran   Social History   Socioeconomic History  . Marital status: Married    Spouse name: Not on file  . Number of children: Not on file  . Years of education: Not on file  . Highest education level: Not on file  Occupational History  . Not on file  Social Needs  . Financial resource strain: Not on file  . Food insecurity:    Worry: Not on file    Inability: Not on file  . Transportation needs:    Medical: Not on file    Non-medical: Not on file  Tobacco Use  . Smoking status: Never Smoker  . Smokeless tobacco: Never Used  Substance and Sexual Activity  . Alcohol use: Never    Frequency: Never  . Drug use: Never  . Sexual activity: Yes  Lifestyle  . Physical activity:    Days per week: Not on file    Minutes per session: Not on file  . Stress: Not on file  Relationships  . Social connections:    Talks on phone: Not on file    Gets together: Not on file    Attends religious service: Not on file    Active member of  club or organization: Not on file    Attends meetings of clubs or organizations: Not on file    Relationship status: Not on file  Other Topics Concern  . Not on file  Social History Narrative  . Not on file     Family History: The patient's family history includes Cancer in his father; Diabetes in his brother; Heart attack (age of onset: 59) in his brother. ROS:   Please see the history of present illness.     All other systems reviewed and are negative.  EKGs/Labs/Other Studies Reviewed:    The following studies were reviewed today:   LEFT HEART CATH AND CORONARY ANGIOGRAPHY 01/26/18  Conclusion    1st Diag lesion is 70% stenosed.  Prox LAD lesion is 25% stenosed.  The left ventricular systolic function is  normal.  LV end diastolic pressure is normal.  The left ventricular ejection fraction is 55-65% by visual estimate.  1. 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 This is unchanged since 2006. 2. Normal LV function 3. Normal LVEDP  Plan: medical management. Consider other causes of his symptoms.  Recommend Aspirin 81mg  daily for moderate CAD.    Diagnostic Diagram          EKG:  EKG is not ordered today.   Recent Labs: 01/25/2018: ALT 46; Magnesium 1.8; TSH 1.444 01/26/2018: BUN 13; Creatinine, Ser 1.05; Hemoglobin 12.7; Platelets 207; Potassium 3.3; Sodium 138   Recent Lipid Panel    Component Value Date/Time   CHOL 188 01/26/2018 0223   TRIG 252 (H) 01/26/2018 0223   HDL 51 01/26/2018 0223   CHOLHDL 3.7 01/26/2018 0223   VLDL 50 (H) 01/26/2018 0223   LDLCALC 87 01/26/2018 0223   LDLDIRECT 121.0 11/28/2017 0802    Physical Exam:    VS:  There were no vitals taken for this visit.    Wt Readings from Last 3 Encounters:  03/07/18 210 lb 9.6 oz (95.5 kg)  01/25/18 206 lb 12.7 oz (93.8 kg)  10/04/17 211 lb 9.6 oz (96 kg)     Physical Exam***   ASSESSMENT:    1. Atherosclerosis of native coronary artery of native heart without angina pectoris   2. Essential hypertension, benign   3. Mixed hyperlipidemia   4. Type 2 diabetes mellitus without complication, with long-term current use of insulin (HCC)    PLAN:    In order of problems listed above:  CAD -Cardiac catheterization in October showed moderate stenosis of a small diagonal branch, unchanged from 2006 -Continue secondary prevention with aspirin, statin  Hypertension -On amlodipine 10 mg, irbesartan-hydrochlorothiazide  Hyperlipidemia -Patient has a history of allergy to atorvastatin.  He has been started on rosuvastatin 20 mg daily in October -LDL was 87 on 01/26/2018.  We will recheck lipid panel and LFTs today to assess his statin.  Goal LDL less  than 70.  Diabetes type 2 -A1c 6.6 at goal of less than 7.  Management per primary care  Medication Adjustments/Labs and Tests Ordered: Current medicines are reviewed at length with the patient today.  Concerns regarding medicines are outlined above. Labs and tests ordered and medication changes are outlined in the patient instructions below:  There are no Patient Instructions on file for this visit.   Signed, Daune Perch, NP  03/20/2018 4:49 AM    [place]

## 2018-03-22 ENCOUNTER — Encounter: Payer: Self-pay | Admitting: Cardiology

## 2018-04-01 ENCOUNTER — Other Ambulatory Visit: Payer: Self-pay | Admitting: Endocrinology

## 2018-05-09 ENCOUNTER — Other Ambulatory Visit (INDEPENDENT_AMBULATORY_CARE_PROVIDER_SITE_OTHER): Payer: 59

## 2018-05-09 ENCOUNTER — Other Ambulatory Visit: Payer: Self-pay | Admitting: Endocrinology

## 2018-05-09 DIAGNOSIS — E1165 Type 2 diabetes mellitus with hyperglycemia: Secondary | ICD-10-CM

## 2018-05-09 DIAGNOSIS — E782 Mixed hyperlipidemia: Secondary | ICD-10-CM

## 2018-05-09 DIAGNOSIS — D352 Benign neoplasm of pituitary gland: Secondary | ICD-10-CM

## 2018-05-09 LAB — LIPID PANEL
Cholesterol: 228 mg/dL — ABNORMAL HIGH (ref 0–200)
HDL: 54.8 mg/dL (ref >39.00)
NONHDL: 173.47
TRIGLYCERIDES: 231 mg/dL — AB (ref 0.0–149.0)
Total CHOL/HDL Ratio: 4
VLDL: 46.2 mg/dL — AB (ref 0.0–40.0)

## 2018-05-09 LAB — COMPREHENSIVE METABOLIC PANEL
ALBUMIN: 4.3 g/dL (ref 3.5–5.2)
ALK PHOS: 57 U/L (ref 39–117)
ALT: 28 U/L (ref 0–53)
AST: 35 U/L (ref 0–37)
BUN: 12 mg/dL (ref 6–23)
CALCIUM: 9.9 mg/dL (ref 8.4–10.5)
CHLORIDE: 100 meq/L (ref 96–112)
CO2: 30 mEq/L (ref 19–32)
CREATININE: 1.08 mg/dL (ref 0.40–1.50)
GFR: 81.32 mL/min (ref >60.00)
Glucose, Bld: 85 mg/dL (ref 70–99)
POTASSIUM: 3.6 meq/L (ref 3.5–5.1)
SODIUM: 139 meq/L (ref 135–145)
TOTAL PROTEIN: 7.7 g/dL (ref 6.0–8.3)
Total Bilirubin: 0.6 mg/dL (ref 0.2–1.2)

## 2018-05-09 LAB — MICROALBUMIN / CREATININE URINE RATIO
Creatinine,U: 116.2 mg/dL
MICROALB UR: 4.5 mg/dL — AB (ref 0.0–1.9)
Microalb Creat Ratio: 3.8 mg/g (ref 0.0–30.0)

## 2018-05-09 LAB — CBC WITH DIFFERENTIAL/PLATELET
BASOS ABS: 0 10*3/uL (ref 0.0–0.1)
Basophils Relative: 0.7 % (ref 0.0–3.0)
EOS ABS: 0.2 10*3/uL (ref 0.0–0.7)
Eosinophils Relative: 6.2 % — ABNORMAL HIGH (ref 0.0–5.0)
HEMATOCRIT: 44.7 % (ref 39.0–52.0)
Hemoglobin: 14.9 g/dL (ref 13.0–17.0)
LYMPHS PCT: 33.3 % (ref 12.0–46.0)
Lymphs Abs: 1.3 10*3/uL (ref 0.7–4.0)
MCHC: 33.3 g/dL (ref 30.0–36.0)
MCV: 84.9 fl (ref 78.0–100.0)
MONOS PCT: 8.6 % (ref 3.0–12.0)
Monocytes Absolute: 0.3 10*3/uL (ref 0.1–1.0)
Neutro Abs: 2.1 10*3/uL (ref 1.4–7.7)
Neutrophils Relative %: 51.2 % (ref 43.0–77.0)
Platelets: 226 10*3/uL (ref 150.0–400.0)
RBC: 5.27 Mil/uL (ref 4.22–5.81)
RDW: 13.9 % (ref 11.5–15.5)
WBC: 4 10*3/uL (ref 4.0–10.5)

## 2018-05-09 LAB — TESTOSTERONE: Testosterone: 369.44 ng/dL (ref 300.00–890.00)

## 2018-05-09 LAB — LDL CHOLESTEROL, DIRECT: LDL DIRECT: 120 mg/dL

## 2018-05-09 LAB — HEMOGLOBIN A1C: HEMOGLOBIN A1C: 6.7 % — AB (ref 4.6–6.5)

## 2018-05-10 ENCOUNTER — Ambulatory Visit (INDEPENDENT_AMBULATORY_CARE_PROVIDER_SITE_OTHER): Payer: 59 | Admitting: Endocrinology

## 2018-05-10 ENCOUNTER — Encounter: Payer: Self-pay | Admitting: Endocrinology

## 2018-05-10 ENCOUNTER — Other Ambulatory Visit: Payer: Self-pay

## 2018-05-10 VITALS — BP 143/72 | HR 94 | Ht 70.0 in | Wt 213.0 lb

## 2018-05-10 DIAGNOSIS — I10 Essential (primary) hypertension: Secondary | ICD-10-CM | POA: Diagnosis not present

## 2018-05-10 DIAGNOSIS — R5383 Other fatigue: Secondary | ICD-10-CM

## 2018-05-10 DIAGNOSIS — E1165 Type 2 diabetes mellitus with hyperglycemia: Secondary | ICD-10-CM

## 2018-05-10 DIAGNOSIS — E782 Mixed hyperlipidemia: Secondary | ICD-10-CM

## 2018-05-10 DIAGNOSIS — D352 Benign neoplasm of pituitary gland: Secondary | ICD-10-CM | POA: Diagnosis not present

## 2018-05-10 LAB — PROLACTIN: PROLACTIN: 11.8 ng/mL (ref 4.0–15.2)

## 2018-05-10 MED ORDER — DOXAZOSIN MESYLATE 2 MG PO TABS: 2.0000 mg | ORAL_TABLET | Freq: Every day | ORAL | 2 refills | Status: DC

## 2018-05-10 MED ORDER — IRBESARTAN-HYDROCHLOROTHIAZIDE 150-12.5 MG PO TABS: 1.0000 | ORAL_TABLET | Freq: Every day | ORAL | 1 refills | Status: DC

## 2018-05-10 MED ORDER — DULAGLUTIDE 0.75 MG/0.5ML ~~LOC~~ SOAJ: SUBCUTANEOUS | 2 refills | Status: DC

## 2018-05-10 MED ORDER — GLUCOSE BLOOD VI STRP: ORAL_STRIP | 5 refills | Status: DC

## 2018-05-10 MED ORDER — GLIMEPIRIDE 2 MG PO TABS: ORAL_TABLET | ORAL | 0 refills | Status: DC

## 2018-05-10 MED ORDER — PRAVASTATIN SODIUM 40 MG PO TABS: 40.0000 mg | ORAL_TABLET | Freq: Every day | ORAL | 3 refills | Status: DC

## 2018-05-10 NOTE — Patient Instructions (Addendum)
Glimeperide 1/2 daily  Walk daily  Vitamin B 12 1000mg  daily  Check blood sugars on waking up 3 days a week  Also check blood sugars about 2 hours after meals and do this after different meals by rotation  Recommended blood sugar levels on waking up are 90-130 and about 2 hours after meal is 130-160  Please bring your blood sugar monitor to each visit, thank you

## 2018-05-10 NOTE — Progress Notes (Signed)
Patient ID: Joshua Vargas, male   DOB: 07/24/1945, 73 y.o.   MRN: [id number]   Reason for Appointment: follow-up of various  problems  History of Present Illness   Type 2 DIABETES MELITUS, date of diagnosis: 2002      Previous history: He was initially treated with metformin alone and then subsequently was also given Actos and Amaryl.  He has had fairly good control but somewhat inconsistent because of difficulties with compliance with diet and exercise periodically. A1c previously has fluctuated between about 6.7-7.6 In 6/15 he was started on Victoza which he wanted to try because of difficulty losing weight and better control  Recent history:   Non-insulin hypoglycemic drugs: Actoplusmet once a day and Amaryl 80m in evening, Trulicity 07.78mg weekly           His A1c has been previously around 7% and now at the highest level of 8.3   Current management, blood sugar patterns and problems identified:  He has not been seen in follow-up since 09/2017  Although he thinks he is checking his blood sugars regularly he does not bring his monitor for download  He probably is checking blood sugars mostly in the mornings and not after meals and these are fairly normal  About once a week or so he will get up at 2 AM and feel like his sugar is low and will be in the 60s  Most of the time this is likely from not eating much in the evening  As before he has not been watching his diet and frequently eating fast food at lunchtime  With this his weight continues to go up  He is trying to do a little walking but not enough and not consistent  Taking Trulicity fairly regularly     Side effects from medications:  anorexia with 1.2 mg Victoza       Monitors blood glucose: Sporadically   Glucometer:  One Touch.           Blood Glucose readings by recall:   Fasting usually 85-90   Physical activity: exercise:  Walking some               Dietician visit: Most recent: 06/2016            Wt Readings from Last 3 Encounters:  05/10/18 213 lb (96.6 kg)  03/07/18 210 lb 9.6 oz (95.5 kg)  01/25/18 206 lb 12.7 oz (93.8 kg)   LABS:  Lab Results  Component Value Date   HGBA1C 6.7 (H) 05/09/2018   HGBA1C 6.6 (H) 01/25/2018   HGBA1C 8.3 (H) 10/03/2017   Lab Results  Component Value Date   MICROALBUR 4.5 (H) 05/09/2018   LDLCALC 87 01/26/2018   CREATININE 1.08 05/09/2018     OTHER problems are discussed in review of systems   LABS:  Lab on 05/09/2018  Component Date Value Ref Range Status  . Prolactin 05/09/2018 11.8  4.0 - 15.2 ng/mL Final  . Testosterone 05/09/2018 369.44  300.00 - 890.00 ng/dL Final  . WBC 05/09/2018 4.0  4.0 - 10.5 K/uL Final  . RBC 05/09/2018 5.27  4.22 - 5.81 Mil/uL Final  . Hemoglobin 05/09/2018 14.9  13.0 - 17.0 g/dL Final  . HCT 05/09/2018 44.7  39.0 - 52.0 % Final  . MCV 05/09/2018 84.9  78.0 - 100.0 fl Final  . MCHC 05/09/2018 33.3  30.0 - 36.0 g/dL Final  . RDW 05/09/2018 13.9  11.5 - 15.5 % Final  .  Platelets 05/09/2018 226.0  150.0 - 400.0 K/uL Final  . Neutrophils Relative % 05/09/2018 51.2  43.0 - 77.0 % Final  . Lymphocytes Relative 05/09/2018 33.3  12.0 - 46.0 % Final  . Monocytes Relative 05/09/2018 8.6  3.0 - 12.0 % Final  . Eosinophils Relative 05/09/2018 6.2* 0.0 - 5.0 % Final  . Basophils Relative 05/09/2018 0.7  0.0 - 3.0 % Final  . Neutro Abs 05/09/2018 2.1  1.4 - 7.7 K/uL Final  . Lymphs Abs 05/09/2018 1.3  0.7 - 4.0 K/uL Final  . Monocytes Absolute 05/09/2018 0.3  0.1 - 1.0 K/uL Final  . Eosinophils Absolute 05/09/2018 0.2  0.0 - 0.7 K/uL Final  . Basophils Absolute 05/09/2018 0.0  0.0 - 0.1 K/uL Final  . Cholesterol 05/09/2018 228* 0 - 200 mg/dL Final   ATP III Classification       Desirable:  < 200 mg/dL               Borderline High:  200 - 239 mg/dL          High:  > = 240 mg/dL  . Triglycerides 05/09/2018 231.0* 0.0 - 149.0 mg/dL Final   Normal:  <150 mg/dLBorderline High:  150 - 199 mg/dL  . HDL  05/09/2018 54.80  >39.00 mg/dL Final  . VLDL 05/09/2018 46.2* 0.0 - 40.0 mg/dL Final  . Total CHOL/HDL Ratio 05/09/2018 4   Final                  Men          Women1/2 Average Risk     3.4          3.3Average Risk          5.0          4.42X Average Risk          9.6          7.13X Average Risk          15.0          11.0                      . NonHDL 05/09/2018 173.47   Final   NOTE:  Non-HDL goal should be 30 mg/dL higher than patient's LDL goal (i.e. LDL goal of < 70 mg/dL, would have non-HDL goal of < 100 mg/dL)  . Microalb, Ur 05/09/2018 4.5* 0.0 - 1.9 mg/dL Final  . Creatinine,U 05/09/2018 116.2  mg/dL Final  . Microalb Creat Ratio 05/09/2018 3.8  0.0 - 30.0 mg/g Final  . Sodium 05/09/2018 139  135 - 145 mEq/L Final  . Potassium 05/09/2018 3.6  3.5 - 5.1 mEq/L Final  . Chloride 05/09/2018 100  96 - 112 mEq/L Final  . CO2 05/09/2018 30  19 - 32 mEq/L Final  . Glucose, Bld 05/09/2018 85  70 - 99 mg/dL Final  . BUN 05/09/2018 12  6 - 23 mg/dL Final  . Creatinine, Ser 05/09/2018 1.08  0.40 - 1.50 mg/dL Final  . Total Bilirubin 05/09/2018 0.6  0.2 - 1.2 mg/dL Final  . Alkaline Phosphatase 05/09/2018 57  39 - 117 U/L Final  . AST 05/09/2018 35  0 - 37 U/L Final  . ALT 05/09/2018 28  0 - 53 U/L Final  . Total Protein 05/09/2018 7.7  6.0 - 8.3 g/dL Final  . Albumin 05/09/2018 4.3  3.5 - 5.2 g/dL Final  . Calcium 05/09/2018 9.9  8.4 - 10.5  mg/dL Final  . GFR 05/09/2018 81.32  >60.00 mL/min Final  . Hgb A1c MFr Bld 05/09/2018 6.7* 4.6 - 6.5 % Final   Glycemic Control Guidelines for People with Diabetes:Non Diabetic:  <6%Goal of Therapy: <7%Additional Action Suggested:  >8%   . Direct LDL 05/09/2018 120.0  mg/dL Final   Optimal:  <100 mg/dLNear or Above Optimal:  100-129 mg/dLBorderline High:  130-159 mg/dLHigh:  160-189 mg/dLVery High:  >190 mg/dL    Allergies as of 05/10/2018      Reactions   Atorvastatin Itching   Dextran Rash   Other Reaction: Not Assessed      Medication List         Accurate as of May 10, 2018  8:42 PM. Always use your most recent med list.        acetaminophen 500 MG tablet Commonly known as:  TYLENOL Take 500 mg by mouth as needed. pain   amLODipine 10 MG tablet Commonly known as:  NORVASC Take 1 tablet (10 mg total) by mouth daily.   aspirin 81 MG EC tablet Take 81 mg by mouth daily.   cabergoline 0.5 MG tablet Commonly known as:  DOSTINEX Take one half-tablet by mouth twice a week (on Monday and Saturday)   doxazosin 2 MG tablet Commonly known as:  CARDURA TAKE ONE TABLET BY MOUTH AT BEDTIME   Fluticasone Propionate 93 MCG/ACT Exhu Commonly known as:  XHANCE Place 2 sprays into the nose 2 (two) times daily.   glimepiride 4 MG tablet Commonly known as:  AMARYL TAKE 1 TABLETS BY MOUTH EVERY DAY AT DINNER   glucose blood test strip Commonly known as:  ONE TOUCH ULTRA TEST Use as instructed to check blood sugar 1 times daily E11.65   irbesartan-hydrochlorothiazide 150-12.5 MG tablet Commonly known as:  AVALIDE TAKE ONE TABLET BY MOUTH ONCE DAILY.   LUMIGAN 0.01 % Soln Generic drug:  bimatoprost Place 1 drop into both eyes at bedtime.   pioglitazone-metformin 15-850 MG tablet Commonly known as:  ACTOPLUS MET Take 1 tablet by mouth daily.   potassium chloride 10 MEQ tablet Commonly known as:  KLOR-CON M10 Take 1 tablet (10 mEq total) by mouth daily.   pravastatin 40 MG tablet Commonly known as:  PRAVACHOL Take 1 tablet (40 mg total) by mouth daily.   rosuvastatin 20 MG tablet Commonly known as:  CRESTOR Take 1 tablet (20 mg total) by mouth daily at 6 PM.   tadalafil 5 MG tablet Commonly known as:  CIALIS TAKE ONE TABLET BY MOUTH DAILY AS NEEDED FOR ERECTILE DYSFUNCTION   TRULICITY 1.61 WR/6.0AV Sopn Generic drug:  Dulaglutide INJECT 1 PEN INTO THE ABDOMINAL SKIN ONCE A WEEK       Allergies:  Allergies  Allergen Reactions  . Atorvastatin Itching  . Dextran Rash    Other Reaction: Not Assessed     Past Medical History:  Diagnosis Date  . Coronary artery disease   . 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 Hyperlipemia   . Hypertension   . Pneumonia 1990s X 1; ~ 2000 X 1  . Type II diabetes mellitus ([place])     Past Surgical History:  Procedure Laterality Date  . CARDIAC CATHETERIZATION  ?2006; 01/25/2018  . ELBOW SURGERY Left    "something to do w/the muscle"  . JOINT REPLACEMENT    . LEFT HEART CATH AND CORONARY ANGIOGRAPHY N/A 01/26/2018   Procedure: LEFT HEART CATH AND CORONARY ANGIOGRAPHY;  [place]: Martinique, Peter M,  MD;  Location: [facility];  Service: Cardiovascular;  Laterality: N/A;  . PICC LINE INSERTION  ~ 2005   "took it out months later"  . 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 TOTAL KNEE ARTHROPLASTY Left 2005  . TRIGGER FINGER RELEASE Bilateral ~ 2016    Family History  Problem Relation Age of Onset  . Cancer Father   . Diabetes Brother   . Heart attack Brother 12    Social History:  reports that he has never smoked. He has never used smokeless tobacco. He reports that he does not drink alcohol or use drugs.  Review of Systems:  Asking about fatigue  Hypertension:   He has been on a regimen of   doxazosin 2 mg, irbesartan HCT and Norvasc Previously on Benicar and not clear why this was changed Potassium is normal with taking the10 mEq supplement    Has not checked his blood pressure at home  Lab Results  Component Value Date   CREATININE 1.08 05/09/2018   BUN 12 05/09/2018   NA 139 05/09/2018   K 3.6 05/09/2018   CL 100 05/09/2018   CO2 30 05/09/2018     Lipids: He had been on Crestor and simvastatin and these were stopped because of increased liver functions No statins have been prescribed since then  Now triglycerides are still high and LDL also is relatively higher He still has not made efforts to improve diet especially with eating out, weight has gone up   Lab Results   Component Value Date   CHOL 228 (H) 05/09/2018   HDL 54.80 05/09/2018   LDLCALC 87 01/26/2018   LDLDIRECT 120.0 05/09/2018   TRIG 231.0 (H) 05/09/2018   CHOLHDL 4 05/09/2018    Abnormal liver functions:  He has had mild increase in liver functions, possibly from fatty liver although his ultrasound was normal in 2011.  No recent history of excessive alcohol May possibly have had high ALT from statins in the past  Recent ALT back to normal  Lab Results  Component Value Date   ALT 28 05/09/2018      He has had a 4x 5-mm  prolactinoma since 2007 with baseline prolactin level of 101, treated with Dostinex .  He has became quite regular with his regimen since his last visit when he was not consistent  He is complaining of persistent fatigue and some sleepiness  Although testosterone had been low previously it is back to normal now   Lab Results  Component Value Date   TESTOSTERONE 369.44 05/09/2018   Erectile dysfunction treated with Cialis  He has difficulty sleeping through the night mostly because of frequent urination  He is asking about recent mild infection of his left great toe which he has been treating on his own with topical treatments    Examination:   BP (!) 143/72 (BP Location: Left Arm, Patient Position: Sitting, Cuff Size: Normal)   Pulse 94   Ht 5' 10"  (1.778 m)   Wt 213 lb (96.6 kg)   SpO2 97%   BMI 30.56 kg/m   Body mass index is 30.56 kg/m.   Standing blood pressure with large cuff 140/70 No pedal edema  Diabetic Foot Exam - Simple   Simple Foot Form Diabetic Foot exam was performed with the following findings:  Yes   Visual Inspection No deformities, no ulcerations, no other skin breakdown bilaterally:  Yes See comments:  Yes Sensation Testing Intact to touch  and monofilament testing bilaterally:  Yes Pulse Check Posterior Tibialis and Dorsalis pulse intact bilaterally:  Yes Comments Left great toenail has been cut distally.  Minimal  swelling of the great toe with no redness or warmth.  No exudate or ulceration     ASSESSMENT/ PLAN:   Diabetes type 2 longstanding  See history of present illness for detailed discussion of current diabetes management, blood sugar patterns and problems identified  He has again been irregular with his follow-up  However A1c is consistent and is now 6.7, has been as high as 8.3 when he is not taking his medications regularly  He has gained weight and has not been consistent with diet especially eating out and has a regular exercise regimen Also not monitoring readings after meals  With taking Amaryl regularly he may occasionally have hypoglycemia at night if he is eating a light meal in the evening  Recommend that he take only half tablet Amaryl He needs to start walking regularly Recommend follow-up consultation with dietitian but he thinks he can do it on his own Discussed need for weight loss Also needs to start checking readings after meals and bring his monitor for download on regular basis  PROLACTINOMA: He has recently been well controlled with Dostinex Prolactin tends to go up if he is not taking Dostinex regularly  HYPOGONADISM: Likely related to hyperprolactinemia and now the level is back to normal with control of prolactin  FATIGUE: This may be from difficulty with sleep or other nonspecific reading He can empirically try B12 supplements    Hypertension: blood pressure is recently better Discussed that his standing blood pressure was normal and he does not need to change his regimen Encouraged him to check blood pressure at home He will continue potassium supplement  HYPERLIPIDEMIA: Has worsening hyperlipidemia with increased LDL and triglycerides He will start pravastatin as this would be less likely to cause interactions and liver function abnormality  Total visit time for evaluation and management of multiple problems in counseling = 25 minutes  Patient  Instructions  Glimeperide 1/2 daily  Walk daily  Vitamin B 12 1034m daily  Check blood sugars on waking up 3 days a week  Also check blood sugars about 2 hours after meals and do this after different meals by rotation  Recommended blood sugar levels on waking up are 90-130 and about 2 hours after meal is 130-160  Please bring your blood sugar monitor to each visit, thank you      AElayne Snare1/30/2020, 8:42 PM

## 2018-05-15 ENCOUNTER — Ambulatory Visit: Payer: 59

## 2018-06-07 ENCOUNTER — Ambulatory Visit: Payer: Medicare Other | Admitting: Allergy

## 2018-06-07 DIAGNOSIS — J309 Allergic rhinitis, unspecified: Secondary | ICD-10-CM

## 2018-06-10 ENCOUNTER — Other Ambulatory Visit: Payer: Self-pay | Admitting: Endocrinology

## 2018-06-11 ENCOUNTER — Telehealth: Payer: Self-pay | Admitting: Endocrinology

## 2018-06-11 ENCOUNTER — Other Ambulatory Visit: Payer: Self-pay

## 2018-06-11 NOTE — Telephone Encounter (Signed)
Called pharmacy and they wanted to clarify the pt's dosage of Amaryl. According to pt's last visit, he reported taking Amaryl 4mg  daily, and after the visit, Dr. Dwyane Dee suggested that the pt begin taking 1/2 tablet daily (2mg ). Pharmacy was informed of this and the 4mg  Rx was removed from the pt's chart.

## 2018-06-11 NOTE — Telephone Encounter (Signed)
[facility] on [address] called, they are need clarification on dosage on Glimepiride that was sent over to them.  Phone: [phone]

## 2018-07-27 ENCOUNTER — Other Ambulatory Visit: Payer: Self-pay | Admitting: Endocrinology

## 2018-07-27 NOTE — Telephone Encounter (Signed)
Ok to refill 

## 2018-07-27 NOTE — Telephone Encounter (Signed)
Okay to refill? 

## 2018-08-08 ENCOUNTER — Ambulatory Visit: Payer: Medicare Other | Admitting: Allergy

## 2018-08-08 ENCOUNTER — Other Ambulatory Visit: Payer: Self-pay

## 2018-08-08 ENCOUNTER — Encounter: Payer: Self-pay | Admitting: Allergy

## 2018-08-08 VITALS — BP 140/70 | HR 83 | Temp 98.1°F | Resp 16 | Ht 69.5 in | Wt 217.0 lb

## 2018-08-08 DIAGNOSIS — J339 Nasal polyp, unspecified: Secondary | ICD-10-CM

## 2018-08-08 DIAGNOSIS — J310 Chronic rhinitis: Secondary | ICD-10-CM | POA: Diagnosis not present

## 2018-08-08 MED ORDER — FLUTICASONE PROPIONATE 93 MCG/ACT NA EXHU: 2.0000 | INHALANT_SUSPENSION | Freq: Two times a day (BID) | NASAL | 5 refills | Status: DC

## 2018-08-08 MED ORDER — METHYLPREDNISOLONE ACETATE 80 MG/ML IJ SUSP
80.0000 mg | Freq: Once | INTRAMUSCULAR | Status: AC
  Administered 2018-08-08: 80 mg via INTRAMUSCULAR

## 2018-08-08 NOTE — Patient Instructions (Addendum)
Chronic Rhinosinusitis with Nasal polyps  Depo-Medrol 80 mg was administered in the office.  Prednisone has been provided and is to be started tomorrow as follows: 20 mg daily x 4 days, 10 mg x1 day, then stop.  Xhance nasal spray device is Fluticasone steroid base.  Xhance allows for deeper deposition of nasla spray into your sinuses for better efficacy.  Use 2 sprays each nostril twice a day.  Demonstrated proper nasal spray technique today.  Provided with another sample today to get started.  Also discussed Dupixent as added on therapy for polyp control.  Dupixent is a injectable medication for control of chronic rhinosinusitis with nasal polyps that is given every 2 weeks at home.  Informational brochure provided.  He is interested in this add-on therapy.  Perennial and seasonal allergic rhinitis  Continue allergen avoidance measures  Resume Xhance nasal spray device as below  I have also recommended nasal saline spray (i.e. Simply Saline) as needed prior to medicated nasal sprays.  Diabetes mellitus   Monitor blood glucose levels while on prednisone.  He has verbalized understanding and has agreed to do so.   Return in about 4-6 months or sooner if symptoms worsen or fail to improve.

## 2018-08-08 NOTE — Progress Notes (Signed)
Follow-up Note  RE: Joshua Vargas MRN: [id number] DOB: 04/09/1946 Date of Office Visit: 08/08/2018   History of present illness: Joshua Vargas is a 73 y.o. male presenting today for follow-up of 03/07/18 of chronic rhinosinusitis with nasal polyps with allergic rhinitis.  He denies any major health changes, surgeries or hospitalizations since his last visit. He states he has had a return of his severe nasal congestion and states that it is bothering his wife at night.  He states about twice a year he gets this severe nasal congestion that is improved with use of systemic steroids.  At his last visit I treated him with depomedrol injection and a 5 day prednisone pack.  Also discussed and provided him with a sample of Xhance to help with polyp management however he states he lost the sample somewhere and never actually tried use.   He states he does not want to have a nasal polyp removal and is wanting something that will be a permanent fix without surgery.  Review of systems: Review of Systems  Constitutional: Negative for chills, fever and malaise/fatigue.  HENT: Positive for congestion. Negative for ear discharge, ear pain, nosebleeds and sore throat.   Eyes: Negative for pain, discharge and redness.  Respiratory: Negative for cough, shortness of breath and wheezing.   Cardiovascular: Negative for chest pain.  Gastrointestinal: Negative for abdominal pain, constipation, diarrhea, heartburn, nausea and vomiting.  Musculoskeletal: Negative for joint pain.  Skin: Negative for itching and rash.  Neurological: Negative for headaches.    All other systems negative unless noted above in HPI  Past medical/social/surgical/family history have been reviewed and are unchanged unless specifically indicated below.  No changes  Medication List: Allergies as of 08/08/2018      Reactions   Atorvastatin Itching   Dextran Rash   Other Reaction: Not Assessed      Medication List       Accurate as of August 08, 2018 11:59 PM. Always use your most recent med list.        acetaminophen 500 MG tablet Commonly known as:  TYLENOL Take 500 mg by mouth as needed. pain   amLODipine 10 MG tablet Commonly known as:  NORVASC Take 1 tablet (10 mg total) by mouth daily.   aspirin 81 MG EC tablet Take 81 mg by mouth daily.   cabergoline 0.5 MG tablet Commonly known as:  DOSTINEX Take one half-tablet by mouth twice a week (on Monday and Saturday)   doxazosin 2 MG tablet Commonly known as:  CARDURA Take 1 tablet (2 mg total) by mouth at bedtime.   Dulaglutide 0.75 MG/0.5ML Sopn Commonly known as:  Trulicity Inject contents of pen once a week   Fluticasone Propionate 93 MCG/ACT Exhu Commonly known as:  Xhance Place 2 sprays into the nose 2 (two) times daily.   Fluticasone Propionate 93 MCG/ACT Exhu Commonly known as:  Xhance Place 2 sprays into the nose 2 (two) times a day.   glimepiride 2 MG tablet Commonly known as:  AMARYL 1 tablet at dinner daily   glucose blood test strip Commonly known as:  ONE TOUCH ULTRA TEST Use as instructed to check blood sugar 1 times daily E11.65   irbesartan-hydrochlorothiazide 150-12.5 MG tablet Commonly known as:  AVALIDE Take 1 tablet by mouth daily.   Lumigan 0.01 % Soln Generic drug:  bimatoprost Place 1 drop into both eyes at bedtime.   olmesartan-hydrochlorothiazide 40-12.5 MG tablet Commonly known as:  BENICAR HCT TAKE 1  TABLET BY MOUTH EVERY DAY   pioglitazone-metformin 15-850 MG tablet Commonly known as:  ACTOPLUS MET Take 1 tablet by mouth daily.   potassium chloride 10 MEQ tablet Commonly known as:  Klor-Con M10 Take 1 tablet (10 mEq total) by mouth daily.   pravastatin 40 MG tablet Commonly known as:  PRAVACHOL Take 1 tablet (40 mg total) by mouth daily.   tadalafil 5 MG tablet Commonly known as:  CIALIS TAKE ONE TABLET BY MOUTH DAILY AS NEEDED FOR ERECTILE DYSFUNCTION       Known medication  allergies: Allergies  Allergen Reactions  . Atorvastatin Itching  . Dextran Rash    Other Reaction: Not Assessed     Physical examination: Blood pressure 140/70, pulse 83, temperature 98.1 F (36.7 C), temperature source Tympanic, resp. rate 16, height 5' 9.5" (1.765 m), weight 217 lb (98.4 kg), SpO2 97 %.  General: Alert, interactive, in no acute distress. HEENT: PERRLA, TMs pearly gray, turbinates moderately edematous without discharge, post-pharynx non erythematous. Neck: Supple without lymphadenopathy. Lungs: Clear to auscultation without wheezing, rhonchi or rales. {no increased work of breathing. CV: Normal S1, S2 without murmurs. Abdomen: Nondistended, nontender. Skin: Warm and dry, without lesions or rashes. Extremities:  No clubbing, cyanosis or edema. Neuro:   Grossly intact.  Diagnositics/Labs: None today  Assessment and plan:   Chronic Rhinosinusitis with Nasal polyps  Depo-Medrol 80 mg was administered in the office.  Prednisone has been provided and is to be started tomorrow as follows: 20 mg daily x 4 days, 10 mg x1 day, then stop.  Xhance nasal spray device is Fluticasone steroid base.  Xhance allows for deeper deposition of nasla spray into your sinuses for better efficacy.  Use 2 sprays each nostril twice a day.  Demonstrated proper nasal spray technique today.  Provided with another sample today to get started.  Also discussed Dupixent as added on therapy for polyp control.  Dupixent is a injectable medication for control of chronic rhinosinusitis with nasal polyps that is given every 2 weeks at home.  Informational brochure provided.  He is interested in this add-on therapy.  Perennial and seasonal allergic rhinitis  Continue allergen avoidance measures  Resume Xhance nasal spray device as below  I have also recommended nasal saline spray (i.e. Simply Saline) as needed prior to medicated nasal sprays.  Diabetes mellitus   Monitor blood glucose  levels while on prednisone.  He has verbalized understanding and has agreed to do so.   Return in about 4-6 months or sooner if symptoms worsen or fail to improve.  I appreciate the opportunity to take part in Joshua Vargas's care. Please do not hesitate to contact me with questions.  Sincerely,   Prudy Feeler, MD Allergy/Immunology [facility]

## 2018-08-10 ENCOUNTER — Telehealth: Payer: Self-pay | Admitting: *Deleted

## 2018-08-10 NOTE — Telephone Encounter (Signed)
L/m for patient to call me to find out ENT info

## 2018-08-10 NOTE — Telephone Encounter (Signed)
I have looked into patient prescription insurance and he has [employer] which is one of the hardest to get patient on.  They want it spelled out regarding his polyps and I could not find ENT visit that will detail his polyps.     Their criteria is: Has the patient had a bilateral nasal endoscopy or anterior rhinoscopy showing polyps reaching below the lower border of the middle turbinate or beyond in each nostril? ACTION REQUIRED: If Yes, please attach supporting chart note(s) or medical record showing endoscopy or rhinoscopy details (e.g., polyps location, size).   I have dealt with this one before and unfortunately without the size and location of polyps bilateral they will not approve. I can reach out to patient to see if he has seen ENT within the last year or two to see if I can get records to send to plan that lays out info on polyps or you will need to include the description on your notes.

## 2018-08-10 NOTE — Telephone Encounter (Signed)
I inherited this pt from the others.   It looks like from Dr. Mariane Masters note from 2017 he saw and ENT the year before and told he had polyps.   But we do not have the specifics and I do not know who he saw back then.     Can you reach out to pt and see you we need to get this info from.   Is that going to be too old of information?  Will he need to see an ENT to get this information? This is also quite ridiculous we need this detailed of information.

## 2018-08-21 ENCOUNTER — Other Ambulatory Visit: Payer: Self-pay

## 2018-08-21 ENCOUNTER — Other Ambulatory Visit (INDEPENDENT_AMBULATORY_CARE_PROVIDER_SITE_OTHER): Payer: 59

## 2018-08-21 DIAGNOSIS — E1165 Type 2 diabetes mellitus with hyperglycemia: Secondary | ICD-10-CM | POA: Diagnosis not present

## 2018-08-21 DIAGNOSIS — E782 Mixed hyperlipidemia: Secondary | ICD-10-CM

## 2018-08-21 LAB — COMPREHENSIVE METABOLIC PANEL
ALT: 32 U/L (ref 0–53)
AST: 32 U/L (ref 0–37)
Albumin: 4.3 g/dL (ref 3.5–5.2)
Alkaline Phosphatase: 54 U/L (ref 39–117)
BUN: 10 mg/dL (ref 6–23)
CO2: 29 mEq/L (ref 19–32)
Calcium: 9.5 mg/dL (ref 8.4–10.5)
Chloride: 102 mEq/L (ref 96–112)
Creatinine, Ser: 0.98 mg/dL (ref 0.40–1.50)
GFR: 90.89 mL/min (ref >60.00)
Glucose, Bld: 85 mg/dL (ref 70–99)
Potassium: 4 mEq/L (ref 3.5–5.1)
Sodium: 139 mEq/L (ref 135–145)
Total Bilirubin: 0.5 mg/dL (ref 0.2–1.2)
Total Protein: 7.6 g/dL (ref 6.0–8.3)

## 2018-08-21 LAB — LIPID PANEL
Cholesterol: 154 mg/dL (ref 0–200)
HDL: 56.4 mg/dL (ref >39.00)
LDL Cholesterol: 59 mg/dL (ref 0–99)
NonHDL: 97.72
Total CHOL/HDL Ratio: 3
Triglycerides: 192 mg/dL — ABNORMAL HIGH (ref 0.0–149.0)
VLDL: 38.4 mg/dL (ref 0.0–40.0)

## 2018-08-21 LAB — HEMOGLOBIN A1C: Hgb A1c MFr Bld: 7.4 % — ABNORMAL HIGH (ref 4.6–6.5)

## 2018-08-24 ENCOUNTER — Encounter: Payer: Self-pay | Admitting: Endocrinology

## 2018-08-24 ENCOUNTER — Ambulatory Visit (INDEPENDENT_AMBULATORY_CARE_PROVIDER_SITE_OTHER): Payer: 59 | Admitting: Endocrinology

## 2018-08-24 ENCOUNTER — Other Ambulatory Visit: Payer: Self-pay

## 2018-08-24 DIAGNOSIS — E782 Mixed hyperlipidemia: Secondary | ICD-10-CM | POA: Diagnosis not present

## 2018-08-24 DIAGNOSIS — E1165 Type 2 diabetes mellitus with hyperglycemia: Secondary | ICD-10-CM | POA: Diagnosis not present

## 2018-08-24 DIAGNOSIS — D352 Benign neoplasm of pituitary gland: Secondary | ICD-10-CM

## 2018-08-24 NOTE — Progress Notes (Signed)
Patient ID: Joshua Vargas, male   DOB: 04/17/1945, 73 y.o.   MRN: [id number]   Today's office visit was provided via telemedicine using video technique Explained to the patient and the the limitations of evaluation and management by telemedicine and the availability of in person appointments.  The patient understood the limitations and agreed to proceed. Patient also understood that the telehealth visit is billable. . Location of the patient: Home . Location of the provider: Office Only the patient and myself were participating in the encounter    Reason for Appointment: follow-up of various  problems  History of Present Illness   Type 2 DIABETES MELITUS, date of diagnosis: 2002      Previous history: He was initially treated with metformin alone and then subsequently was also given Actos and Amaryl.  He has had fairly good control but somewhat inconsistent because of difficulties with compliance with diet and exercise periodically. A1c previously has fluctuated between about 6.7-7.6 In 6/15 he was started on Victoza which he wanted to try because of difficulty losing weight and better control  Recent history:   Non-insulin hypoglycemic drugs: Actoplusmet once a day and Amaryl 1 mg in evening, Trulicity 0.21 mg weekly           His A1c has been previously below 7% a couple of times and now 7.4 compared to 6.7   Current management, blood sugar patterns and problems identified:  He has not been seen in follow-up since 1/20  He apparently has not checked his blood sugar for quite some time and would not give me his previous readings from his meter today  He did have a good blood sugar of 85 fasting on his labs  This makes it likely that he is having high readings after meals  He is generally not motivated to check readings after meals also in the past  He thinks he has not done well with his lifestyle because of needing to work from home more and being more  sedentary  Has not made any commitment or efforts to go walking for exercise  He does not know what his weight is recently although appears to be relatively higher when seen by allergist last month  He did get a Medrol injection and a few days of prednisone starting on 4/29 but did not check his sugars after this  Does not feel he has any anorexia from Trulicity and overall may be getting more caloric intake overall      Side effects from medications:  anorexia with 1.2 mg Victoza       Monitors blood glucose: Sporadically   Glucometer:  One Touch.           Blood Glucose readings not available               Dietician visit: Most recent: 06/2016            Wt Readings from Last 3 Encounters:  08/08/18 217 lb (98.4 kg)  05/10/18 213 lb (96.6 kg)  03/07/18 210 lb 9.6 oz (95.5 kg)   LABS:  Lab Results  Component Value Date   HGBA1C 7.4 (H) 08/21/2018   HGBA1C 6.7 (H) 05/09/2018   HGBA1C 6.6 (H) 01/25/2018   Lab Results  Component Value Date   MICROALBUR 4.5 (H) 05/09/2018   LDLCALC 59 08/21/2018   CREATININE 0.98 08/21/2018     OTHER problems are discussed in review of systems   LABS:  Lab on 08/21/2018  Component Date Value Ref Range Status  . Cholesterol 08/21/2018 154  0 - 200 mg/dL Final   ATP III Classification       Desirable:  < 200 mg/dL               Borderline High:  200 - 239 mg/dL          High:  > = 240 mg/dL  . Triglycerides 08/21/2018 192.0* 0.0 - 149.0 mg/dL Final   Normal:  <150 mg/dLBorderline High:  150 - 199 mg/dL  . HDL 08/21/2018 56.40  >39.00 mg/dL Final  . VLDL 08/21/2018 38.4  0.0 - 40.0 mg/dL Final  . LDL Cholesterol 08/21/2018 59  0 - 99 mg/dL Final  . Total CHOL/HDL Ratio 08/21/2018 3   Final                  Men          Women1/2 Average Risk     3.4          3.3Average Risk          5.0          4.42X Average Risk          9.6          7.13X Average Risk          15.0          11.0                      . NonHDL 08/21/2018 97.72    Final   NOTE:  Non-HDL goal should be 30 mg/dL higher than patient's LDL goal (i.e. LDL goal of < 70 mg/dL, would have non-HDL goal of < 100 mg/dL)  . Sodium 08/21/2018 139  135 - 145 mEq/L Final  . Potassium 08/21/2018 4.0  3.5 - 5.1 mEq/L Final  . Chloride 08/21/2018 102  96 - 112 mEq/L Final  . CO2 08/21/2018 29  19 - 32 mEq/L Final  . Glucose, Bld 08/21/2018 85  70 - 99 mg/dL Final  . BUN 08/21/2018 10  6 - 23 mg/dL Final  . Creatinine, Ser 08/21/2018 0.98  0.40 - 1.50 mg/dL Final  . Total Bilirubin 08/21/2018 0.5  0.2 - 1.2 mg/dL Final  . Alkaline Phosphatase 08/21/2018 54  39 - 117 U/L Final  . AST 08/21/2018 32  0 - 37 U/L Final  . ALT 08/21/2018 32  0 - 53 U/L Final  . Total Protein 08/21/2018 7.6  6.0 - 8.3 g/dL Final  . Albumin 08/21/2018 4.3  3.5 - 5.2 g/dL Final  . Calcium 08/21/2018 9.5  8.4 - 10.5 mg/dL Final  . GFR 08/21/2018 90.89  >60.00 mL/min Final  . Hgb A1c MFr Bld 08/21/2018 7.4* 4.6 - 6.5 % Final   Glycemic Control Guidelines for People with Diabetes:Non Diabetic:  <6%Goal of Therapy: <7%Additional Action Suggested:  >8%     Allergies as of 08/24/2018      Reactions   Atorvastatin Itching   Dextran Rash   Other Reaction: Not Assessed      Medication List       Accurate as of Aug 24, 2018  4:20 PM. If you have any questions, ask your nurse or doctor.        acetaminophen 500 MG tablet Commonly known as:  TYLENOL Take 500 mg by mouth as needed. pain   amLODipine 10 MG tablet Commonly known as:  NORVASC Take 1 tablet (  10 mg total) by mouth daily.   aspirin 81 MG EC tablet Take 81 mg by mouth daily.   cabergoline 0.5 MG tablet Commonly known as:  DOSTINEX Take one half-tablet by mouth twice a week (on Monday and Saturday) What changed:    how much to take  how to take this  when to take this  additional instructions   doxazosin 2 MG tablet Commonly known as:  CARDURA Take 1 tablet (2 mg total) by mouth at bedtime.   Dulaglutide 0.75  MG/0.5ML Sopn Commonly known as:  Trulicity Inject contents of pen once a week   Fluticasone Propionate 93 MCG/ACT Exhu Commonly known as:  Xhance Place 2 sprays into the nose 2 (two) times daily.   Fluticasone Propionate 93 MCG/ACT Exhu Commonly known as:  Xhance Place 2 sprays into the nose 2 (two) times a day.   glimepiride 2 MG tablet Commonly known as:  AMARYL 1 tablet at dinner daily   glucose blood test strip Commonly known as:  ONE TOUCH ULTRA TEST Use as instructed to check blood sugar 1 times daily E11.65   irbesartan-hydrochlorothiazide 150-12.5 MG tablet Commonly known as:  AVALIDE Take 1 tablet by mouth daily.   Lumigan 0.01 % Soln Generic drug:  bimatoprost Place 1 drop into both eyes at bedtime.   olmesartan-hydrochlorothiazide 40-12.5 MG tablet Commonly known as:  BENICAR HCT TAKE 1 TABLET BY MOUTH EVERY DAY   pioglitazone-metformin 15-850 MG tablet Commonly known as:  ACTOPLUS MET Take 1 tablet by mouth daily.   potassium chloride 10 MEQ tablet Commonly known as:  Klor-Con M10 Take 1 tablet (10 mEq total) by mouth daily.   pravastatin 40 MG tablet Commonly known as:  PRAVACHOL Take 1 tablet (40 mg total) by mouth daily.   tadalafil 5 MG tablet Commonly known as:  CIALIS TAKE ONE TABLET BY MOUTH DAILY AS NEEDED FOR ERECTILE DYSFUNCTION       Allergies:  Allergies  Allergen Reactions  . Atorvastatin Itching  . Dextran Rash    Other Reaction: Not Assessed    Past Medical History:  Diagnosis Date  . Coronary artery disease   . 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 Hyperlipemia   . Hypertension   . Pneumonia 1990s X 1; ~ 2000 X 1  . Type II diabetes mellitus ([place])     Past Surgical History:  Procedure Laterality Date  . CARDIAC CATHETERIZATION  ?2006; 01/25/2018  . ELBOW SURGERY Left    "something to do w/the muscle"  . JOINT REPLACEMENT    . LEFT HEART CATH AND CORONARY ANGIOGRAPHY N/A 01/26/2018    Procedure: LEFT HEART CATH AND CORONARY ANGIOGRAPHY;  [place]: [place], Peter M, MD;  Location: [facility];  Service: Cardiovascular;  Laterality: N/A;  . PICC LINE INSERTION  ~ 2005   "took it out months later"  . 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 TOTAL KNEE ARTHROPLASTY Left 2005  . TRIGGER FINGER RELEASE Bilateral ~ 2016    Family History  Problem Relation Age of Onset  . Cancer Father   . Diabetes Brother   . Heart attack Brother 55    Social History:  reports that he has never smoked. He has never used smokeless tobacco. He reports that he does not drink alcohol or use drugs.  Review of Systems:  Asking about fatigue  Hypertension:   He has been on a regimen of   doxazosin 2  mg, olmesartan HCT and Norvasc 10 mg  Potassium is normal with taking the10 mEq supplement  Has not checked his blood pressure at home recently and does not remember his previous readings  Lab Results  Component Value Date   CREATININE 0.98 08/21/2018   BUN 10 08/21/2018   NA 139 08/21/2018   K 4.0 08/21/2018   CL 102 08/21/2018   CO2 29 08/21/2018     Lipids: He had been on Crestor and simvastatin and these were stopped because of increased liver functions Because of significantly high lipids on his last visit he was told to start pravastatin which he is tolerating ALT is still normal  Lab Results  Component Value Date   CHOL 154 08/21/2018   CHOL 228 (H) 05/09/2018   CHOL 188 01/26/2018   Lab Results  Component Value Date   HDL 56.40 08/21/2018   HDL 54.80 05/09/2018   HDL 51 01/26/2018   Lab Results  Component Value Date   LDLCALC 59 08/21/2018   LDLCALC 87 01/26/2018   LDLCALC 91 05/15/2014   Lab Results  Component Value Date   TRIG 192.0 (H) 08/21/2018   TRIG 231.0 (H) 05/09/2018   TRIG 252 (H) 01/26/2018   Lab Results  Component Value Date   CHOLHDL 3 08/21/2018   CHOLHDL 4 05/09/2018   CHOLHDL 3.7 01/26/2018   Lab Results   Component Value Date   LDLDIRECT 120.0 05/09/2018   LDLDIRECT 121.0 11/28/2017   LDLDIRECT 106.0 10/03/2017      Abnormal liver functions:  He has had mild increase in liver functions, possibly from fatty liver although his ultrasound was normal in 2011.  No recent history of excessive alcohol May possibly have had high ALT from statins in the past, currently on pravastatin  Current ALT back to normal  Lab Results  Component Value Date   ALT 32 08/21/2018      He has had a 4x 5-mm  prolactinoma since 2007 with baseline prolactin level of 101, treated with Dostinex .  He has been regular with this and prolactin has been normal lately  Testosterone level normal with control of prolactin   Lab Results  Component Value Date   TESTOSTERONE 369.44 05/09/2018   Erectile dysfunction treated with Cialis     Examination:   There were no vitals taken for this visit.  There is no height or weight on file to calculate BMI.    ASSESSMENT/ PLAN:   Diabetes type 2 with mild obesity  See history of present illness for detailed discussion of current diabetes management, blood sugar patterns and problems identified  His A1c which was 6.7 is now significantly higher at 7.7  This is mostly related to inconsistent compliance with diet and exercise Also may have had some high readings with getting steroids about 2 weeks ago However discussed the importance of checking his blood sugars especially after meals which he is not doing Also he needs to start a walking program in the evening and work up to at least 10 miles a week He can send [place] his blood sugar readings on my chart in a couple of weeks   Hypertension: blood pressure needs to be checked Last blood pressure was 572 systolic However he does need to check blood pressure at home and let us know the results by secure message  No change in medications currently  HYPERLIPIDEMIA: Has significantly better lipids with adding  pravastatin and he will continue since ALT is normal  There  are no Patient Instructions on file for this visit.   Elayne Snare 08/24/2018, 4:20 PM

## 2018-08-30 NOTE — Telephone Encounter (Signed)
Tried to contact patient again. Mailbox full unable to leave message

## 2018-09-01 ENCOUNTER — Other Ambulatory Visit: Payer: Self-pay | Admitting: Endocrinology

## 2018-09-02 ENCOUNTER — Other Ambulatory Visit: Payer: Self-pay | Admitting: Endocrinology

## 2018-09-04 ENCOUNTER — Telehealth: Payer: Self-pay | Admitting: Endocrinology

## 2018-09-04 ENCOUNTER — Telehealth: Payer: Self-pay

## 2018-09-04 ENCOUNTER — Other Ambulatory Visit: Payer: Self-pay

## 2018-09-04 MED ORDER — DULAGLUTIDE 0.75 MG/0.5ML ~~LOC~~ SOAJ: SUBCUTANEOUS | 2 refills | Status: DC

## 2018-09-04 NOTE — Telephone Encounter (Signed)
Rx sent 

## 2018-09-04 NOTE — Telephone Encounter (Signed)
MEDICATION: Dulaglutide (TRULICITY) 4.85 IO/2.7OJ SOPN (with increased dosage per virtual appt with Dr. Dwyane Dee)  PHARMACY:   [id number] - [place], [place] - [address] [phone] (Phone) [phone] (Fax)    IS THIS A 90 DAY SUPPLY : Yes  IS PATIENT OUT OF MEDICATION: No  IF NOT; HOW MUCH IS LEFT: 2 pens  LAST APPOINTMENT DATE: @5 /26/2020  NEXT APPOINTMENT DATE:@8 /21/2020  DO WE HAVE YOUR PERMISSION TO LEAVE A DETAILED MESSAGE: Yes  OTHER COMMENTS:    **Let patient know to contact pharmacy at the end of the day to make sure medication is ready. **  ** Please notify patient to allow 48-72 hours to process**  **Encourage patient to contact the pharmacy for refills or they can request refills through [facility]**

## 2018-09-04 NOTE — Telephone Encounter (Signed)
Per Dr. Ronnie Derby request, called pt to schedule 3 mo f/u and non-fasting labs (sometime in Aug). LVM on home# requesting returned call. Also called cell# but unable to LVM d/t VM being full.

## 2018-09-13 ENCOUNTER — Other Ambulatory Visit: Payer: Self-pay | Admitting: Endocrinology

## 2018-09-25 ENCOUNTER — Other Ambulatory Visit: Payer: Self-pay | Admitting: Endocrinology

## 2018-10-24 ENCOUNTER — Other Ambulatory Visit: Payer: Self-pay | Admitting: Endocrinology

## 2018-10-24 NOTE — Telephone Encounter (Signed)
Okay to refill? 

## 2018-10-24 NOTE — Telephone Encounter (Signed)
Ok to refill 

## 2018-11-27 ENCOUNTER — Other Ambulatory Visit: Payer: Self-pay | Admitting: Endocrinology

## 2018-11-30 ENCOUNTER — Other Ambulatory Visit: Payer: Self-pay

## 2018-11-30 ENCOUNTER — Other Ambulatory Visit (INDEPENDENT_AMBULATORY_CARE_PROVIDER_SITE_OTHER): Payer: 59

## 2018-11-30 DIAGNOSIS — D352 Benign neoplasm of pituitary gland: Secondary | ICD-10-CM

## 2018-11-30 DIAGNOSIS — E1165 Type 2 diabetes mellitus with hyperglycemia: Secondary | ICD-10-CM

## 2018-11-30 LAB — COMPREHENSIVE METABOLIC PANEL
ALT: 22 U/L (ref 0–53)
AST: 26 U/L (ref 0–37)
Albumin: 4.4 g/dL (ref 3.5–5.2)
Alkaline Phosphatase: 51 U/L (ref 39–117)
BUN: 11 mg/dL (ref 6–23)
CO2: 30 mEq/L (ref 19–32)
Calcium: 9.4 mg/dL (ref 8.4–10.5)
Chloride: 102 mEq/L (ref 96–112)
Creatinine, Ser: 0.93 mg/dL (ref 0.40–1.50)
GFR: 96.48 mL/min (ref >60.00)
Glucose, Bld: 122 mg/dL — ABNORMAL HIGH (ref 70–99)
Potassium: 4.2 mEq/L (ref 3.5–5.1)
Sodium: 139 mEq/L (ref 135–145)
Total Bilirubin: 0.4 mg/dL (ref 0.2–1.2)
Total Protein: 7.7 g/dL (ref 6.0–8.3)

## 2018-11-30 LAB — HEMOGLOBIN A1C: Hgb A1c MFr Bld: 6.3 % (ref 4.6–6.5)

## 2018-11-30 LAB — TESTOSTERONE: Testosterone: 443.42 ng/dL (ref 300.00–890.00)

## 2018-12-01 LAB — PROLACTIN: Prolactin: 4.3 ng/mL (ref 4.0–15.2)

## 2018-12-06 ENCOUNTER — Ambulatory Visit (INDEPENDENT_AMBULATORY_CARE_PROVIDER_SITE_OTHER): Payer: 59 | Admitting: Endocrinology

## 2018-12-06 ENCOUNTER — Other Ambulatory Visit: Payer: Self-pay

## 2018-12-06 ENCOUNTER — Encounter: Payer: Self-pay | Admitting: Endocrinology

## 2018-12-06 DIAGNOSIS — D352 Benign neoplasm of pituitary gland: Secondary | ICD-10-CM | POA: Diagnosis not present

## 2018-12-06 DIAGNOSIS — E782 Mixed hyperlipidemia: Secondary | ICD-10-CM | POA: Diagnosis not present

## 2018-12-06 DIAGNOSIS — I10 Essential (primary) hypertension: Secondary | ICD-10-CM | POA: Diagnosis not present

## 2018-12-06 DIAGNOSIS — E1165 Type 2 diabetes mellitus with hyperglycemia: Secondary | ICD-10-CM | POA: Diagnosis not present

## 2018-12-06 NOTE — Progress Notes (Signed)
Patient ID: Joshua Vargas, male   DOB: 09-12-1945, 73 y.o.   MRN: [id number]   Today's office visit was provided via telemedicine using video technique Explained to the patient and the the limitations of evaluation and management by telemedicine and the availability of in person appointments.  The patient understood the limitations and agreed to proceed. Patient also understood that the telehealth visit is billable.  Location of the patient: Home  Location of the provider: Office Only the patient and myself were participating in the encounter    Reason for Appointment: follow-up of various  problems  History of Present Illness   Type 2 DIABETES MELITUS, date of diagnosis: 2002      Previous history: He was initially treated with metformin alone and then subsequently was also given Actos and Amaryl.  He has had fairly good control but somewhat inconsistent because of difficulties with compliance with diet and exercise periodically. A1c previously has fluctuated between about 6.7-7.6 In 6/15 he was started on Victoza which he wanted to try because of difficulty losing weight and better control  Recent history:   Non-insulin hypoglycemic drugs: Actoplusmet 15/850 once a day, Amaryl 1 mg in evening, Trulicity 1.5 mg weekly           His A1c is 6.3, previously 7.4   Current management, blood sugar patterns and problems identified:  He has checks blood sugars somewhat more frequently than before although still only very erratically  He says that after he was told to increase his Trulicity from 2.42 up to 1.5 mg weekly his blood sugars started improving and with some readings in the 70s he would feel a little hypoglycemic  On his own he reduced his Amaryl to half a tablet of the 2 mg  No hypoglycemia in the last month  Although he is not doing a lot of formal exercise he is doing generally more activity with yard work, limited by leg pain  Also previously had higher  readings right before his visit because of steroids  He appears to have lost 12 pounds since his last visit and his weight is now 353  Trulicity: He appears to be taking 2 injections of 1.5 mg together weekly  He misunderstood his instructions for Trulicity on the previous visit when he was told to use 2 injections of the 0.75 and then switch to 1.5 weekly  Does not feel he has any anorexia from increasing the dose of Trulicity      Side effects from medications:  anorexia with 1.2 mg Victoza       Monitors blood glucose: Sporadically   Glucometer:  One Touch.           Blood Glucose readings   FASTING range 86-123 Nonfasting 81-131 with only 7 readings in the last month or so averaging 102               Dietician visit: Most recent: 06/2016            Wt Readings from Last 3 Encounters:  08/08/18 217 lb (98.4 kg)  05/10/18 213 lb (96.6 kg)  03/07/18 210 lb 9.6 oz (95.5 kg)   LABS:  Lab Results  Component Value Date   HGBA1C 6.3 11/30/2018   HGBA1C 7.4 (H) 08/21/2018   HGBA1C 6.7 (H) 05/09/2018   Lab Results  Component Value Date   MICROALBUR 4.5 (H) 05/09/2018   LDLCALC 59 08/21/2018   CREATININE 0.93 11/30/2018     OTHER problems  are discussed in review of systems   LABS:  Lab on 11/30/2018  Component Date Value Ref Range Status   Prolactin 11/30/2018 4.3  4.0 - 15.2 ng/mL Final   Testosterone 11/30/2018 443.42  300.00 - 890.00 ng/dL Final   Sodium 11/30/2018 139  135 - 145 mEq/L Final   Potassium 11/30/2018 4.2  3.5 - 5.1 mEq/L Final   Chloride 11/30/2018 102  96 - 112 mEq/L Final   CO2 11/30/2018 30  19 - 32 mEq/L Final   Glucose, Bld 11/30/2018 122* 70 - 99 mg/dL Final   BUN 11/30/2018 11  6 - 23 mg/dL Final   Creatinine, Ser 11/30/2018 0.93  0.40 - 1.50 mg/dL Final   Total Bilirubin 11/30/2018 0.4  0.2 - 1.2 mg/dL Final   Alkaline Phosphatase 11/30/2018 51  39 - 117 U/L Final   AST 11/30/2018 26  0 - 37 U/L Final   ALT 11/30/2018 22  0  - 53 U/L Final   Total Protein 11/30/2018 7.7  6.0 - 8.3 g/dL Final   Albumin 11/30/2018 4.4  3.5 - 5.2 g/dL Final   Calcium 11/30/2018 9.4  8.4 - 10.5 mg/dL Final   GFR 11/30/2018 96.48  >60.00 mL/min Final   Hgb A1c MFr Bld 11/30/2018 6.3  4.6 - 6.5 % Final   Glycemic Control Guidelines for People with Diabetes:Non Diabetic:  <6%Goal of Therapy: <7%Additional Action Suggested:  >8%     Allergies as of 12/06/2018      Reactions   Atorvastatin Itching   Dextran Rash   Other Reaction: Not Assessed      Medication List       Accurate as of December 06, 2018  8:45 PM. If you have any questions, ask your nurse or doctor.        acetaminophen 500 MG tablet Commonly known as: TYLENOL Take 500 mg by mouth as needed. pain   amLODipine 10 MG tablet Commonly known as: NORVASC Take 1 tablet (10 mg total) by mouth daily.   aspirin 81 MG EC tablet Take 81 mg by mouth daily.   cabergoline 0.5 MG tablet Commonly known as: DOSTINEX Take one half-tablet by mouth twice a week (on Monday and Saturday) What changed:   how much to take  how to take this  when to take this  additional instructions   doxazosin 2 MG tablet Commonly known as: CARDURA Take 1 tablet (2 mg total) by mouth at bedtime.   Fluticasone Propionate 93 MCG/ACT Exhu Commonly known as: Xhance Place 2 sprays into the nose 2 (two) times daily.   Fluticasone Propionate 93 MCG/ACT Exhu Commonly known as: Xhance Place 2 sprays into the nose 2 (two) times a day.   glimepiride 2 MG tablet Commonly known as: AMARYL TAKE 1 TABLET BY MOUTH EVERY DAY AT DINNER   glucose blood test strip Commonly known as: ONE TOUCH ULTRA TEST Use as instructed to check blood sugar 1 times daily E11.65   irbesartan-hydrochlorothiazide 150-12.5 MG tablet Commonly known as: AVALIDE Take 1 tablet by mouth daily.   Lumigan 0.01 % Soln Generic drug: bimatoprost Place 1 drop into both eyes at bedtime.     olmesartan-hydrochlorothiazide 40-12.5 MG tablet Commonly known as: BENICAR HCT TAKE 1 TABLET BY MOUTH EVERY DAY   pioglitazone-metformin 15-850 MG tablet Commonly known as: ACTOPLUS MET TAKE 1 TABLET BY MOUTH TWICE A DAY WITH MEALS   potassium chloride 10 MEQ tablet Commonly known as: [place]-Con M10 Take 1 tablet (10 mEq total) by  mouth daily.   pravastatin 40 MG tablet Commonly known as: PRAVACHOL TAKE 1 TABLET BY MOUTH EVERY DAY   tadalafil 5 MG tablet Commonly known as: CIALIS TAKE 1 TABLET BY MOUTH DAILY AS NEEDED FOR ERECTILE DYSFUNCTION   Trulicity 1.5 EH/2.0NO Sopn Generic drug: Dulaglutide Inject 1.5 mg into the skin once a week. Inject 1.22m under the skin once weekly. What changed: Another medication with the same name was removed. Continue taking this medication, and follow the directions you see here. 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"   Hyperlipemia    Hypertension    Pneumonia 1990s X 1; ~ 2000 X 1   Type II diabetes mellitus ([place]     Past Surgical History:  Procedure Laterality Date   CARDIAC CATHETERIZATION  ?2006; 01/25/2018   ELBOW SURGERY Left    "something to do w/the muscle"   JOINT REPLACEMENT     LEFT HEART CATH AND CORONARY ANGIOGRAPHY N/A 01/26/2018   Procedure: LEFT HEART CATH AND CORONARY ANGIOGRAPHY;  Meixner: JMartinique Peter M, MD;  [facility];  Service: Cardiovascular;  Laterality: N/A;   PICC LINE INSERTION  ~ 2005   "took it out months later"   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"   TOTAL KNEE ARTHROPLASTY Left 2005   TRIGGER FINGER RELEASE Bilateral ~ 2016    Family History  Problem Relation Age of Onset   Cancer Father     Diabetes Brother    Heart attack Brother 570   Social History:  reports that he has never smoked. He has never used smokeless tobacco. He reports that he does not drink alcohol or use drugs.  Review of Systems:   Hypertension:   He has been on a regimen of   doxazosin 2 mg, olmesartan HCT and Norvasc 10 mg  He did check his blood pressure occasionally and recently it was 128/78 Has not been to his PCP also recently  Potassium is normal with taking the10 mEq supplement  Lab Results  Component Value Date   CREATININE 0.93 11/30/2018   BUN 11 11/30/2018   NA 139 11/30/2018   K 4.2 11/30/2018   CL 102 11/30/2018   CO2 30 11/30/2018     Lipids: He had been on Crestor and simvastatin and these were stopped because of increased liver functions Because of significantly high lipids on his last visit he was told to start pravastatin With this his LDL has been well controlled ALT is now consistently normal  Lab Results  Component Value Date   CHOL 154 08/21/2018   CHOL 228 (H) 05/09/2018   CHOL 188 01/26/2018   Lab Results  Component Value Date   HDL 56.40 08/21/2018   HDL 54.80 05/09/2018   HDL 51 01/26/2018   Lab Results  Component Value Date   LDLCALC 59 08/21/2018   LDLCALC 87 01/26/2018   LDLCALC 91 05/15/2014   Lab Results  Component Value Date   TRIG 192.0 (H) 08/21/2018   TRIG 231.0 (H) 05/09/2018   TRIG 252 (H) 01/26/2018   Lab Results  Component Value Date   CHOLHDL 3 08/21/2018   CHOLHDL 4 05/09/2018  CHOLHDL 3.7 01/26/2018   Lab Results  Component Value Date   LDLDIRECT 120.0 05/09/2018   LDLDIRECT 121.0 11/28/2017   LDLDIRECT 106.0 10/03/2017      Abnormal liver functions:  He has had mild increase in liver functions, possibly from fatty liver although his ultrasound was normal in 2011.  No recent history of excessive alcohol May possibly have had high ALT from statins in the past, currently on pravastatin  ALT stays normal now  Lab  Results  Component Value Date   ALT 22 11/30/2018      He has had a 4x 5-mm  prolactinoma since 2007 with baseline prolactin level of 101, treated with Dostinex .  He has been regular with this and prolactin has been normal lately  Testosterone level normal with normalization of prolactin  Lab Results  Component Value Date   PROLACTIN 4.3 11/30/2018   PROLACTIN 11.8 05/09/2018   PROLACTIN 5.5 11/28/2017   PROLACTIN 18.4 (H) 10/03/2017   PROLACTIN 8.6 02/03/2017      Lab Results  Component Value Date   TESTOSTERONE 443.42 11/30/2018   Erectile dysfunction treated with Cialis     Examination:   There were no vitals taken for this visit.  There is no height or weight on file to calculate BMI.    ASSESSMENT/ PLAN:   Diabetes type 2 with mild obesity  See history of present illness for detailed discussion of current diabetes management, blood sugar patterns and problems identified  His A1c which was 7.7 is now down to 6.3  He has benefited from increased dose of Trulicity which he is tolerating Also has lost weight with lifestyle changes He has already cut his Amaryl dose down to half a tablet  He does need to check reading more consistently Also discussed that if his blood sugars continue to stay low normal and he has readings in the 70s and 80s more often he can stop Amaryl  However explained to him that his dose of Trulicity is 1 injection/week as on the prescription and not to injections together  Liver function abnormality: Consistently normal now  Hypertension: blood pressure is reportedly well controlled at home with current regimen  Hyperprolactinemia: Consistently controlled with cabergoline May consider reducing the dose if prolactin stays low normal, was minimally high in 6/19  There are no Patient Instructions on file for this visit.   Elayne Snare 12/06/2018, 8:45 PM

## 2018-12-17 ENCOUNTER — Other Ambulatory Visit: Payer: Self-pay | Admitting: Endocrinology

## 2018-12-22 ENCOUNTER — Other Ambulatory Visit: Payer: Self-pay | Admitting: Endocrinology

## 2018-12-23 ENCOUNTER — Other Ambulatory Visit: Payer: Self-pay | Admitting: Endocrinology

## 2019-01-27 ENCOUNTER — Other Ambulatory Visit: Payer: Self-pay | Admitting: Endocrinology

## 2019-02-07 ENCOUNTER — Ambulatory Visit: Payer: Medicare Other | Admitting: Allergy

## 2019-02-15 ENCOUNTER — Other Ambulatory Visit: Payer: Self-pay | Admitting: Endocrinology

## 2019-02-28 ENCOUNTER — Ambulatory Visit (INDEPENDENT_AMBULATORY_CARE_PROVIDER_SITE_OTHER): Payer: 59 | Admitting: Allergy

## 2019-02-28 ENCOUNTER — Other Ambulatory Visit: Payer: Self-pay

## 2019-02-28 ENCOUNTER — Encounter: Payer: Self-pay | Admitting: Allergy

## 2019-02-28 DIAGNOSIS — J339 Nasal polyp, unspecified: Secondary | ICD-10-CM

## 2019-02-28 DIAGNOSIS — J310 Chronic rhinitis: Secondary | ICD-10-CM

## 2019-02-28 DIAGNOSIS — J3089 Other allergic rhinitis: Secondary | ICD-10-CM | POA: Diagnosis not present

## 2019-02-28 MED ORDER — XHANCE 93 MCG/ACT NA EXHU: 2.0000 | INHALANT_SUSPENSION | Freq: Two times a day (BID) | NASAL | 5 refills | Status: DC

## 2019-02-28 NOTE — Patient Instructions (Addendum)
Chronic Rhinosinusitis with Nasal polyps  Much improved with Xhance nasal spray device!  Continue Xhance  2 sprays each nostril twice a day as needed for nasal congestion/sinus pressure.    Dupixent has been previously discussed as added on therapy for polyp control.  Dupixent is a injectable medication for control of chronic rhinosinusitis with nasal polyps that is given every 2 weeks at home.  Informational brochure has been provided.  Can pursue this option if Truett Perna becomes less effective in management of symptoms  Perennial and seasonal allergic rhinitis  Continue allergen avoidance measures  Xhance as above  I have also recommended nasal saline spray (i.e. Simply Saline) as needed prior to medicated nasal sprays.   Return in about 6-9 months or sooner if symptoms worsen or fail to improve.

## 2019-02-28 NOTE — Progress Notes (Signed)
RE: Joshua Vargas MRN: [id number] DOB: 03-29-1946 Date of Telemedicine Visit: 02/28/2019  Referring provider: Seward Carol, MD Primary care provider: Seward Carol, MD  Chief Complaint: Allergies (No problems)   Telemedicine Follow Up Visit via Telephone: I connected with Arnell Sieving Trageser for a follow up on 02/28/19 by telephone and verified that I am speaking with the correct person using two identifiers.   I discussed the limitations, risks, security and privacy concerns of performing an evaluation and management service by telephone and the availability of in person appointments. I also discussed with the patient that there may be a patient responsible charge related to this service. The patient expressed understanding and agreed to proceed.  Patient is at home.  Provider is at the office.  Visit start time: [id number] Visit end time: [place] consent/check in by: Eustaquio Maize F Medical consent and medical assistant/nurse: Chipper Oman  History of Present Illness: He is a 73 y.o. male, who is being followed for chronic rhinosinusitis with nasal polyps and allergic rhinitis.  His previous allergy office visit was on 08/08/2018 with Dr. Nelva Bush.   He states he may need to use the Xhance spray device 2-3 times a month for congestion which helps greatly.  He states this nasal spray device he been working wonders for his nasal/sinus control when he needs to use it.  He has not needed any further systemic steroids since last visit.   He has plans to get flu vaccine.     Assessment and Plan: Bruno is a 73 y.o. male with:   Chronic Rhinosinusitis with Nasal polyps  Much improved with Xhance nasal spray device!  Continue Xhance  2 sprays each nostril twice a day as needed for nasal congestion/sinus pressure.    Dupixent has been previously discussed as added on therapy for polyp control.  Dupixent is a injectable medication for control of chronic rhinosinusitis with nasal polyps that is given  every 2 weeks at home.  Informational brochure has been provided.  Can pursue this option if Truett Perna becomes less effective in management of symptoms  Perennial and seasonal allergic rhinitis  Continue allergen avoidance measures  Xhance as above  I have also recommended nasal saline spray (i.e. Simply Saline) as needed prior to medicated nasal sprays.   Return in about 6-9 months or sooner if symptoms worsen or fail to improve.  Diagnostics: None.  Medication List:  Current Outpatient Medications  Medication Sig Dispense Refill  . acetaminophen (TYLENOL) 500 MG tablet Take 500 mg by mouth as needed. pain     . amLODipine (NORVASC) 10 MG tablet TAKE 1 TABLET BY MOUTH EVERY DAY 90 tablet 0  . aspirin 81 MG EC tablet Take 81 mg by mouth daily.      . cabergoline (DOSTINEX) 0.5 MG tablet Take 0.5 tablets (0.25 mg total) by mouth See admin instructions. Take one half-tablet by mouth twice a week (on Wednesday and Saturday 36 tablet 1  . doxazosin (CARDURA) 2 MG tablet Take 1 tablet (2 mg total) by mouth at bedtime. 30 tablet 2  . Dulaglutide (TRULICITY) 1.5 HF/0.2OV SOPN Inject 1.5 mg into the skin once a week. Inject 1.26m under the skin once weekly.    . Fluticasone Propionate (XHANCE) 93 MCG/ACT EXHU Place 2 sprays into the nose 2 (two) times daily. 16 mL 5  . Fluticasone Propionate (XHANCE) 93 MCG/ACT EXHU Place 2 sprays into the nose 2 (two) times a day. (Patient taking differently: Place 2 sprays into the nose 2 (  two) times a day. Using once daily as needed.) 32 mL 5  . glimepiride (AMARYL) 2 MG tablet TAKE 1 TABLET BY MOUTH EVERY DAY AT DINNER 90 tablet 0  . glucose blood (ONE TOUCH ULTRA TEST) test strip Use as instructed to check blood sugar 1 times daily E11.65 100 each 5  . irbesartan-hydrochlorothiazide (AVALIDE) 150-12.5 MG tablet Take 1 tablet by mouth daily. 90 tablet 1  . KLOR-CON M10 10 MEQ tablet TAKE 1 TABLET BY MOUTH EVERY DAY 90 tablet 3  . LUMIGAN 0.01 % SOLN Place 1  drop into both eyes at bedtime.   1  . olmesartan-hydrochlorothiazide (BENICAR HCT) 40-12.5 MG tablet TAKE 1 TABLET BY MOUTH EVERY DAY 90 tablet 1  . pioglitazone-metformin (ACTOPLUS MET) 15-850 MG tablet TAKE 1 TABLET BY MOUTH TWICE A DAY WITH MEALS 180 tablet 2  . pravastatin (PRAVACHOL) 40 MG tablet TAKE 1 TABLET BY MOUTH EVERY DAY 90 tablet 1  . tadalafil (CIALIS) 5 MG tablet TAKE 1 TABLET BY MOUTH DAILY AS NEEDED FOR ERECTILE DYSFUNCTION 30 tablet 0  . TRULICITY 1.61 WR/6.0AV SOPN INJECT CONTENTS OF PEN ONCE A WEEK 4 pen 2   No current facility-administered medications for this visit.    Allergies: Allergies  Allergen Reactions  . Atorvastatin Itching  . Dextran Rash    Other Reaction: Not Assessed   I reviewed his past medical history, social history, family history, and environmental history and no significant changes have been reported from previous visit on 08/08/2018.  Review of Systems  Constitutional: Negative for chills and fever.  HENT: Negative for congestion, postnasal drip, rhinorrhea, sinus pressure, sinus pain, sneezing and sore throat.   Eyes: Negative.   Respiratory: Negative.   Cardiovascular: Negative.   Gastrointestinal: Negative.   Musculoskeletal: Negative.   Skin: Negative.   Neurological: Negative.    Objective: Physical Exam Not obtained as encounter was done via telephone.   Previous notes and tests were reviewed.  I discussed the assessment and treatment plan with the patient. The patient was provided an opportunity to ask questions and all were answered. The patient agreed with the plan and demonstrated an understanding of the instructions.   The patient was advised to call back or seek an in-person evaluation if the symptoms worsen or if the condition fails to improve as anticipated.  I provided 9 minutes of non-face-to-face time during this encounter.  It was my pleasure to participate in Amber Guthridge care today. Please feel free to  contact me with any questions or concerns.   Sincerely,  Audray Rumore Charmian Muff, MD

## 2019-03-20 ENCOUNTER — Other Ambulatory Visit (INDEPENDENT_AMBULATORY_CARE_PROVIDER_SITE_OTHER): Payer: Self-pay

## 2019-03-20 ENCOUNTER — Other Ambulatory Visit: Payer: Self-pay

## 2019-03-20 DIAGNOSIS — E1165 Type 2 diabetes mellitus with hyperglycemia: Secondary | ICD-10-CM

## 2019-03-20 DIAGNOSIS — E782 Mixed hyperlipidemia: Secondary | ICD-10-CM

## 2019-03-20 DIAGNOSIS — D352 Benign neoplasm of pituitary gland: Secondary | ICD-10-CM

## 2019-03-20 LAB — LIPID PANEL
Cholesterol: 130 mg/dL (ref 0–200)
HDL: 59 mg/dL (ref >39.00)
LDL Cholesterol: 55 mg/dL (ref 0–99)
NonHDL: 71.43
Total CHOL/HDL Ratio: 2
Triglycerides: 82 mg/dL (ref 0.0–149.0)
VLDL: 16.4 mg/dL (ref 0.0–40.0)

## 2019-03-20 LAB — COMPREHENSIVE METABOLIC PANEL
ALT: 17 U/L (ref 0–53)
AST: 24 U/L (ref 0–37)
Albumin: 4.3 g/dL (ref 3.5–5.2)
Alkaline Phosphatase: 48 U/L (ref 39–117)
BUN: 16 mg/dL (ref 6–23)
CO2: 30 mEq/L (ref 19–32)
Calcium: 9.6 mg/dL (ref 8.4–10.5)
Chloride: 101 mEq/L (ref 96–112)
Creatinine, Ser: 0.98 mg/dL (ref 0.40–1.50)
GFR: 90.75 mL/min (ref >60.00)
Glucose, Bld: 75 mg/dL (ref 70–99)
Potassium: 3.8 mEq/L (ref 3.5–5.1)
Sodium: 137 mEq/L (ref 135–145)
Total Bilirubin: 0.4 mg/dL (ref 0.2–1.2)
Total Protein: 7.9 g/dL (ref 6.0–8.3)

## 2019-03-20 LAB — HEMOGLOBIN A1C: Hgb A1c MFr Bld: 5.9 % (ref 4.6–6.5)

## 2019-03-20 LAB — MICROALBUMIN / CREATININE URINE RATIO
Creatinine,U: 147.2 mg/dL
Microalb Creat Ratio: 4.6 mg/g (ref 0.0–30.0)
Microalb, Ur: 6.7 mg/dL — ABNORMAL HIGH (ref 0.0–1.9)

## 2019-03-21 ENCOUNTER — Other Ambulatory Visit: Payer: 59

## 2019-03-21 LAB — PROLACTIN: Prolactin: 5.4 ng/mL (ref 4.0–15.2)

## 2019-03-22 ENCOUNTER — Ambulatory Visit: Payer: Self-pay

## 2019-03-26 ENCOUNTER — Encounter: Payer: Self-pay | Admitting: Endocrinology

## 2019-03-26 ENCOUNTER — Other Ambulatory Visit: Payer: Self-pay

## 2019-03-26 NOTE — Progress Notes (Signed)
This encounter was created in error - please disregard.

## 2019-04-17 ENCOUNTER — Telehealth: Payer: Self-pay

## 2019-04-17 NOTE — Telephone Encounter (Signed)
The appointment was not completed it was missed

## 2019-04-17 NOTE — Telephone Encounter (Signed)
-----   Message from Elayne Snare, MD sent at 04/09/2019  2:45 PM EST ----- Regarding: Needs follow-up He missed his appointment and needs to be rescheduled, if we can see him within the next month we may not need labs again

## 2019-04-17 NOTE — Telephone Encounter (Signed)
Doxy appt complete 03/26/19. I only see a missed appt of flu shot.Joshua Vargas

## 2019-04-22 ENCOUNTER — Other Ambulatory Visit: Payer: Self-pay

## 2019-04-22 ENCOUNTER — Telehealth: Payer: Self-pay

## 2019-04-22 MED ORDER — POTASSIUM CHLORIDE CRYS ER 10 MEQ PO TBCR: 10.0000 meq | EXTENDED_RELEASE_TABLET | Freq: Every day | ORAL | 3 refills | Status: DC

## 2019-04-22 NOTE — Telephone Encounter (Signed)
Rx sent 

## 2019-04-22 NOTE — Telephone Encounter (Signed)
MEDICATION: KLOR-CON M10 10 MEQ tablet  PHARMACY:  [id number] - [place], [place] - [address]  IS THIS A 90 DAY SUPPLY : yes   IS PATIENT OUT OF MEDICATION:   IF NOT; HOW MUCH IS LEFT:   LAST APPOINTMENT DATE: @1 /09/2019  NEXT APPOINTMENT DATE:@1 /27/2021  DO WE HAVE YOUR PERMISSION TO LEAVE A DETAILED MESSAGE:  OTHER COMMENTS:    **Let patient know to contact pharmacy at the end of the day to make sure medication is ready. **  ** Please notify patient to allow 48-72 hours to process**  **Encourage patient to contact the pharmacy for refills or they can request refills through [facility]**

## 2019-04-22 NOTE — Telephone Encounter (Signed)
Patient has been scheduled

## 2019-04-22 NOTE — Telephone Encounter (Signed)
Please let patient know that if he is not able to make regular follow-up appointments and keep them will not be able to refill his medications.  He has not made an appointment that he missed

## 2019-05-02 ENCOUNTER — Ambulatory Visit: Payer: 59 | Attending: Internal Medicine

## 2019-05-02 DIAGNOSIS — Z23 Encounter for immunization: Secondary | ICD-10-CM | POA: Insufficient documentation

## 2019-05-02 NOTE — Progress Notes (Signed)
   Covid-19 Vaccination Clinic  Name:  Zykee Loya Ciszek    MRN: [id number] DOB: August 05, 1945  05/02/2019  Mr. Seher was observed post Covid-19 immunization for 15 minutes without incidence. He was provided with Vaccine Information Sheet and instruction to access the V-Safe system.   Mr. Nadeem was instructed to call 911 with any severe reactions post vaccine: Marland Kitchen Difficulty breathing  . Swelling of your face and throat  . A fast heartbeat  . A bad rash all over your body  . Dizziness and weakness    Immunizations Administered    Name Date Dose VIS Date Route   Pfizer COVID-19 Vaccine 05/02/2019 11:31 AM 0.3 mL 03/22/2019 Intramuscular   Manufacturer: [place]   Lot: [id number]   [place]: [id number]

## 2019-05-06 ENCOUNTER — Other Ambulatory Visit: Payer: 59

## 2019-05-06 ENCOUNTER — Other Ambulatory Visit: Payer: Self-pay

## 2019-05-06 ENCOUNTER — Other Ambulatory Visit: Payer: Self-pay | Admitting: Endocrinology

## 2019-05-08 ENCOUNTER — Other Ambulatory Visit: Payer: Self-pay

## 2019-05-13 ENCOUNTER — Ambulatory Visit (INDEPENDENT_AMBULATORY_CARE_PROVIDER_SITE_OTHER): Payer: 59 | Admitting: Endocrinology

## 2019-05-13 ENCOUNTER — Encounter: Payer: Self-pay | Admitting: Endocrinology

## 2019-05-13 ENCOUNTER — Other Ambulatory Visit: Payer: Self-pay

## 2019-05-13 DIAGNOSIS — E669 Obesity, unspecified: Secondary | ICD-10-CM

## 2019-05-13 DIAGNOSIS — D352 Benign neoplasm of pituitary gland: Secondary | ICD-10-CM | POA: Diagnosis not present

## 2019-05-13 DIAGNOSIS — I10 Essential (primary) hypertension: Secondary | ICD-10-CM | POA: Diagnosis not present

## 2019-05-13 DIAGNOSIS — E1169 Type 2 diabetes mellitus with other specified complication: Secondary | ICD-10-CM | POA: Diagnosis not present

## 2019-05-13 DIAGNOSIS — E782 Mixed hyperlipidemia: Secondary | ICD-10-CM

## 2019-05-13 DIAGNOSIS — E119 Type 2 diabetes mellitus without complications: Secondary | ICD-10-CM

## 2019-05-13 NOTE — Progress Notes (Signed)
Patient ID: Joshua Vargas, male   DOB: 02-05-1946, 74 y.o.   MRN: [id number]   Today's office visit was provided via telemedicine using video technique Explained to the patient and the the limitations of evaluation and management by telemedicine and the availability of in person appointments.  The patient understood the limitations and agreed to proceed. Patient also understood that the telehealth visit is billable. . Location of the patient: Home . Location of the provider: Office Only the patient and myself were participating in the encounter    Reason for Appointment: follow-up of various  problems  History of Present Illness   Type 2 DIABETES MELITUS, date of diagnosis: 2002      Previous history: He was initially treated with metformin alone and then subsequently was also given Actos and Amaryl.  He has had fairly good control but somewhat inconsistent because of difficulties with compliance with diet and exercise periodically. A1c previously has fluctuated between about 6.7-7.6 In 6/15 he was started on Victoza which he wanted to try because of difficulty losing weight and better control  Recent history:   Non-insulin hypoglycemic drugs: Actoplusmet 15/850 once a day, Amaryl 2 mg in evening, Trulicity 1.5 mg weekly           His A1c is lower at 5.9 compared to 6.3 as of 12/20   Current management, blood sugar patterns and problems identified:  He has not been seen in follow-up since 11/2018  He was told to continue the reduced dose of half a tablet of the Amaryl 2 mg on his last visit but he forgot and is now taking the whole tablet  Occasionally he may feel weak and shaky but does not check his sugar at that time  Also this may occur during the night when he wakes up but lowest blood sugar overnight is only about 80 recently  Lab glucose was 75 fasting in December  With taking 1.5 mg Trulicity he has lost more weight and about 5 to 7 pounds since his last  visit in August  His last recorded weight in the office several months ago was 217  He is not doing any formal exercise but doing some manual work in the afternoons and evenings with some projects but not doing any walking.  His job is relatively sedentary  With higher dose of Trulicity he is eating smaller portions, does not feel that his appetite is suppressed excessively  Recently does not appear to have any high readings but not checking enough readings after meals      Side effects from medications:  anorexia with 1.2 mg Victoza       Monitors blood glucose: Sporadically   Glucometer:  One Touch.           Blood Glucose readings    PRE-MEAL Fasting Lunch  6:30 PM  overnight Overall  Glucose range:  83-107   114  80-125   Mean/median:        POST-MEAL PC Breakfast PC Lunch PC Dinner  Glucose range:    97  Mean/median:       PREVIOUS readings:  FASTING range 86-123 Nonfasting 81-131 with only 7 readings in the last month or so averaging 102               Dietician visit: Most recent: 06/2016            Wt Readings from Last 3 Encounters:  08/08/18 217 lb (98.4 kg)  05/10/18 213  lb (96.6 kg)  03/07/18 210 lb 9.6 oz (95.5 kg)   LABS:  Lab Results  Component Value Date   HGBA1C 5.9 03/20/2019   HGBA1C 6.3 11/30/2018   HGBA1C 7.4 (H) 08/21/2018   Lab Results  Component Value Date   MICROALBUR 6.7 (H) 03/20/2019   LDLCALC 55 03/20/2019   CREATININE 0.98 03/20/2019     OTHER problems are discussed in review of systems   LABS:  No visits with results within 1 Week(s) from this visit.  Latest known visit with results is:  Lab on 03/20/2019  Component Date Value Ref Range Status  . Microalb, Ur 03/20/2019 6.7* 0.0 - 1.9 mg/dL Final  . Creatinine,U 03/20/2019 147.2  mg/dL Final  . Microalb Creat Ratio 03/20/2019 4.6  0.0 - 30.0 mg/g Final  . Prolactin 03/20/2019 5.4  4.0 - 15.2 ng/mL Final  . Cholesterol 03/20/2019 130  0 - 200 mg/dL Final   ATP III  Classification       Desirable:  < 200 mg/dL               Borderline High:  200 - 239 mg/dL          High:  > = 240 mg/dL  . Triglycerides 03/20/2019 82.0  0.0 - 149.0 mg/dL Final   Normal:  <150 mg/dLBorderline High:  150 - 199 mg/dL  . HDL 03/20/2019 59.00  >39.00 mg/dL Final  . VLDL 03/20/2019 16.4  0.0 - 40.0 mg/dL Final  . LDL Cholesterol 03/20/2019 55  0 - 99 mg/dL Final  . Total CHOL/HDL Ratio 03/20/2019 2   Final                  Men          Women1/2 Average Risk     3.4          3.3Average Risk          5.0          4.42X Average Risk          9.6          7.13X Average Risk          15.0          11.0                      . NonHDL 03/20/2019 71.43   Final   NOTE:  Non-HDL goal should be 30 mg/dL higher than patient's LDL goal (i.e. LDL goal of < 70 mg/dL, would have non-HDL goal of < 100 mg/dL)  . Sodium 03/20/2019 137  135 - 145 mEq/L Final  . Potassium 03/20/2019 3.8  3.5 - 5.1 mEq/L Final  . Chloride 03/20/2019 101  96 - 112 mEq/L Final  . CO2 03/20/2019 30  19 - 32 mEq/L Final  . Glucose, Bld 03/20/2019 75  70 - 99 mg/dL Final  . BUN 03/20/2019 16  6 - 23 mg/dL Final  . Creatinine, Ser 03/20/2019 0.98  0.40 - 1.50 mg/dL Final  . Total Bilirubin 03/20/2019 0.4  0.2 - 1.2 mg/dL Final  . Alkaline Phosphatase 03/20/2019 48  39 - 117 U/L Final  . AST 03/20/2019 24  0 - 37 U/L Final  . ALT 03/20/2019 17  0 - 53 U/L Final  . Total Protein 03/20/2019 7.9  6.0 - 8.3 g/dL Final  . Albumin 03/20/2019 4.3  3.5 - 5.2 g/dL Final  . GFR 03/20/2019 90.75  >60.00  mL/min Final  . Calcium 03/20/2019 9.6  8.4 - 10.5 mg/dL Final  . Hgb A1c MFr Bld 03/20/2019 5.9  4.6 - 6.5 % Final   Glycemic Control Guidelines for People with Diabetes:Non Diabetic:  <6%Goal of Therapy: <7%Additional Action Suggested:  >8%     Allergies as of 05/13/2019      Reactions   Atorvastatin Itching   Dextran Rash   Other Reaction: Not Assessed      Medication List       Accurate as of May 13, 2019  1:52  PM. If you have any questions, ask your nurse or doctor.        acetaminophen 500 MG tablet Commonly known as: TYLENOL Take 500 mg by mouth as needed. pain   amLODipine 10 MG tablet Commonly known as: NORVASC TAKE 1 TABLET BY MOUTH EVERY DAY   aspirin 81 MG EC tablet Take 81 mg by mouth daily.   cabergoline 0.5 MG tablet Commonly known as: DOSTINEX Take 0.5 tablets (0.25 mg total) by mouth See admin instructions. Take one half-tablet by mouth twice a week (on Wednesday and Saturday   doxazosin 2 MG tablet Commonly known as: CARDURA Take 1 tablet (2 mg total) by mouth at bedtime.   glimepiride 2 MG tablet Commonly known as: AMARYL TAKE 1 TABLET BY MOUTH EVERY DAY AT DINNER   glucose blood test strip Commonly known as: ONE TOUCH ULTRA TEST Use as instructed to check blood sugar 1 times daily E11.65   irbesartan-hydrochlorothiazide 150-12.5 MG tablet Commonly known as: AVALIDE Take 1 tablet by mouth daily.   Lumigan 0.01 % Soln Generic drug: bimatoprost Place 1 drop into both eyes at bedtime.   olmesartan-hydrochlorothiazide 40-12.5 MG tablet Commonly known as: BENICAR HCT TAKE 1 TABLET BY MOUTH EVERY DAY   pioglitazone-metformin 15-850 MG tablet Commonly known as: ACTOPLUS MET TAKE 1 TABLET BY MOUTH TWICE A DAY WITH MEALS   potassium chloride 10 MEQ tablet Commonly known as: Klor-Con M10 Take 1 tablet (10 mEq total) by mouth daily.   pravastatin 40 MG tablet Commonly known as: PRAVACHOL TAKE 1 TABLET BY MOUTH EVERY DAY   tadalafil 5 MG tablet Commonly known as: CIALIS TAKE 1 TABLET BY MOUTH DAILY AS NEEDED FOR ERECTILE DYSFUNCTION   Trulicity 1.5 BZ/2.0EY Sopn Generic drug: Dulaglutide Inject 1.5 mg into the skin once a week. Inject 1.13m under the skin once weekly. What changed: Another medication with the same name was removed. Continue taking this medication, and follow the directions you see here. Changed by: AElayne Snare MD   XCyril Loosen[employer]G/ACT  Exhu Generic drug: Fluticasone Propionate Place 2 sprays into the nose 2 (two) times daily.       Allergies:  Allergies  Allergen Reactions  . Atorvastatin Itching  . Dextran Rash    Other Reaction: Not Assessed    Past Medical History:  Diagnosis Date  . Coronary artery disease   . 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 Hyperlipemia   . Hypertension   . Pneumonia 1990s X 1; ~ 2000 X 1  . Type II diabetes mellitus ([place]     Past Surgical History:  Procedure Laterality Date  . CARDIAC CATHETERIZATION  ?2006; 01/25/2018  . ELBOW SURGERY Left    "something to do w/the muscle"  . JOINT REPLACEMENT    . LEFT HEART CATH AND CORONARY ANGIOGRAPHY N/A 01/26/2018   Procedure: LEFT HEART CATH AND CORONARY ANGIOGRAPHY;  Posa: JMartinique Peter M, MD;  Location: [facility];  Service: Cardiovascular;  Laterality: N/A;  . PICC LINE INSERTION  ~ 2005   "took it out months later"  . 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 TOTAL KNEE ARTHROPLASTY Left 2005  . TRIGGER FINGER RELEASE Bilateral ~ 2016    Family History  Problem Relation Age of Onset  . Cancer Father   . Diabetes Brother   . Heart attack Brother 3  . Allergic rhinitis Neg Hx   . Angioedema Neg Hx   . Asthma Neg Hx   . Atopy Neg Hx   . Eczema Neg Hx   . Immunodeficiency Neg Hx   . Urticaria Neg Hx     Social History:  reports that he has never smoked. He has never used smokeless tobacco. He reports that he does not drink alcohol or use drugs.  Review of Systems:   Hypertension:   He has been on a regimen of   doxazosin 2 mg, olmesartan HCT and Norvasc 10 mg Apparently he is not taking doxazosin and he checked his medication cabinet today  He has been occasionally checking his blood pressure Last reading 128/78 and rarely reading diastolic may be up to 86   Potassium is normal with taking the10 mEq supplement  Lab Results  Component Value Date    CREATININE 0.98 03/20/2019   BUN 16 03/20/2019   NA 137 03/20/2019   K 3.8 03/20/2019   CL 101 03/20/2019   CO2 30 03/20/2019     Lipids: He had been on Crestor and simvastatin and these were stopped because of increased liver functions Because of significantly high lipids on his last visit he was told to start pravastatin With this his LDL has been well controlled Lipids are improved with recent weight loss including triglycerides  Lab Results  Component Value Date   CHOL 130 03/20/2019   CHOL 154 08/21/2018   CHOL 228 (H) 05/09/2018   Lab Results  Component Value Date   HDL 59.00 03/20/2019   HDL 56.40 08/21/2018   HDL 54.80 05/09/2018   Lab Results  Component Value Date   LDLCALC 55 03/20/2019   LDLCALC 59 08/21/2018   [place] 87 01/26/2018   Lab Results  Component Value Date   TRIG 82.0 03/20/2019   TRIG 192.0 (H) 08/21/2018   TRIG 231.0 (H) 05/09/2018   Lab Results  Component Value Date   CHOLHDL 2 03/20/2019   CHOLHDL 3 08/21/2018   CHOLHDL 4 05/09/2018   Lab Results  Component Value Date   LDLDIRECT 120.0 05/09/2018   LDLDIRECT 121.0 11/28/2017   LDLDIRECT 106.0 10/03/2017      Abnormal liver functions:  He has had mild increase in liver functions, possibly from fatty liver although his ultrasound was normal in 2011.  No recent history of excessive alcohol May possibly have had high ALT from statins in the past, currently on pravastatin  ALT has been normal recently  Lab Results  Component Value Date   ALT 17 03/20/2019      He has had a 4x 5-mm  prolactinoma since 2007 with baseline prolactin level of 101, treated with Dostinex .  He has been regular with this and prolactin has been normal consistently  Testosterone level came back to normal with normalization of prolactin  Lab Results  Component Value Date   PROLACTIN 5.4 03/20/2019   PROLACTIN 4.3 11/30/2018   PROLACTIN 11.8 05/09/2018   PROLACTIN 5.5 11/28/2017   PROLACTIN  18.4  (H) 10/03/2017      Lab Results  Component Value Date   TESTOSTERONE 443.42 11/30/2018   Erectile dysfunction treated with Cialis     Examination:   There were no vitals taken for this visit.  There is no height or weight on file to calculate BMI.    ASSESSMENT/ PLAN:   Diabetes type 2 with mild obesity  See history of present illness for detailed discussion of current diabetes management, blood sugar patterns and problems identified  His A1c which was 7.7 is now down to 5.9  He has benefited from increased dose of Trulicity and now taking 1.5 mg weekly without side effects Although he was supposed to continue only 1 mg Amaryl he is still taking 2 mg With this he has low normal sugars although hypoglycemic symptoms have been only occasional Recently has been more active also Overall doing really well with his diet  He can however check more readings after meals to help adjust his diet as needed Fasting readings have been normal mostly and as low as 75 in the lab  He will continue his prescriptions as before but take only half tablet of 2 mg Amaryl If his blood sugars are still in the 70s and 80s or having symptoms of hypoglycemia he can stop glimepiride completely Follow-up in 4 months  Liver function abnormality: Resolved  Hypertension: blood pressure is reportedly well controlled at home although unclear how often he monitors Previously on doxazosin but he appears not to be taking this  Hyperprolactinemia: Consistently controlled with cabergoline  LIPIDS: Very well controlled and improved with weight loss especially triglycerides  He is already vaccinated for Covid-19 and is planning to get a second injection also  Follow-up in 4 months  There are no Patient Instructions on file for this visit.   Elayne Snare 05/13/2019, 1:52 PM

## 2019-05-15 ENCOUNTER — Telehealth: Payer: Self-pay

## 2019-05-15 NOTE — Telephone Encounter (Signed)
-----   Message from Elayne Snare, MD sent at 05/13/2019  2:05 PM EST ----- Regarding: Follow-up He will need to see me in follow-up in 4 months with labs.  Also please send him a list of [facility] physicians taking new patients either on my chart or print version

## 2019-05-15 NOTE — Telephone Encounter (Signed)
Patient is scheduled for 4 month f/u with labs prior to visit with Dr. Dwyane Dee.

## 2019-05-18 ENCOUNTER — Other Ambulatory Visit: Payer: Self-pay | Admitting: Endocrinology

## 2019-05-20 NOTE — Telephone Encounter (Signed)
Okay to refill? 

## 2019-05-20 NOTE — Telephone Encounter (Signed)
Ok to refill 

## 2019-05-20 NOTE — Telephone Encounter (Signed)
Noted  

## 2019-05-23 ENCOUNTER — Ambulatory Visit: Payer: 59 | Attending: Internal Medicine

## 2019-05-23 DIAGNOSIS — Z23 Encounter for immunization: Secondary | ICD-10-CM | POA: Insufficient documentation

## 2019-05-23 NOTE — Progress Notes (Signed)
   Covid-19 Vaccination Clinic  Name:  Delone Heckle Landau    MRN: [id number] DOB: 07-16-1945  05/23/2019  Mr. Tignor was observed post Covid-19 immunization for 15 minutes without incidence. He was provided with Vaccine Information Sheet and instruction to access the V-Safe system.   Mr. Leising was instructed to call 911 with any severe reactions post vaccine: Marland Kitchen Difficulty breathing  . Swelling of your face and throat  . A fast heartbeat  . A bad rash all over your body  . Dizziness and weakness    Immunizations Administered    Name Date Dose VIS Date Route   Pfizer COVID-19 Vaccine 05/23/2019 11:47 AM 0.3 mL 03/22/2019 Intramuscular   Manufacturer: [employer], [employer]   Lot: EN [place]   [place]: [id number]

## 2019-06-20 ENCOUNTER — Other Ambulatory Visit: Payer: Self-pay | Admitting: Endocrinology

## 2019-06-21 ENCOUNTER — Other Ambulatory Visit: Payer: Self-pay | Admitting: Endocrinology

## 2019-06-21 NOTE — Telephone Encounter (Signed)
Ok to refill 

## 2019-06-21 NOTE — Telephone Encounter (Signed)
Noted  

## 2019-06-21 NOTE — Telephone Encounter (Signed)
ok 

## 2019-06-29 ENCOUNTER — Other Ambulatory Visit: Payer: Self-pay | Admitting: Endocrinology

## 2019-07-16 ENCOUNTER — Other Ambulatory Visit: Payer: Self-pay | Admitting: Endocrinology

## 2019-07-23 ENCOUNTER — Other Ambulatory Visit: Payer: Self-pay | Admitting: Endocrinology

## 2019-07-30 ENCOUNTER — Other Ambulatory Visit: Payer: Self-pay | Admitting: Endocrinology

## 2019-07-31 ENCOUNTER — Telehealth: Payer: Self-pay

## 2019-07-31 NOTE — Telephone Encounter (Signed)
Needs to find out what is preferred on his insurance

## 2019-07-31 NOTE — Telephone Encounter (Signed)
Called pt and informed him of this. He will contact his insurance company and find out the covered alternative.

## 2019-07-31 NOTE — Telephone Encounter (Signed)
Received fax from [email or web address] stating that PA is required for Trulicity. Would you like to do PA or change medication?

## 2019-08-29 ENCOUNTER — Ambulatory Visit: Payer: Medicare Other | Admitting: Allergy

## 2019-08-29 ENCOUNTER — Other Ambulatory Visit: Payer: Self-pay | Admitting: Endocrinology

## 2019-08-29 NOTE — Telephone Encounter (Signed)
Ok to refill 

## 2019-08-29 NOTE — Telephone Encounter (Signed)
Okay to refill? 

## 2019-08-29 NOTE — Telephone Encounter (Signed)
Noted  

## 2019-09-05 NOTE — Telephone Encounter (Signed)
Patient called today stating [employer] only wants to speak to Dr. Dwyane Dee and discussTrulicity he was told that once [facility] can speak to MD Trulicity can be approved- he would like a call back at [phone]

## 2019-09-05 NOTE — Telephone Encounter (Signed)
Since we do not know why it insurance is not covering Trulicity need to know what they are saying.  They are likely wanting to try different drug first before approving it.  I would prefer that he call insurance and get the details himself

## 2019-09-06 ENCOUNTER — Other Ambulatory Visit: Payer: 59

## 2019-09-06 NOTE — Telephone Encounter (Signed)
Called pt's insurance company to find out what was covered. They informed me that I contact the wrong dept. They then transferred me to a specialty pharmacy. The specialty pharmacy could not help because the patient does not have an account. The line was then disconnected. Will contact insurance again and attempt to get more information.

## 2019-09-09 NOTE — Progress Notes (Signed)
This encounter was created in error - please disregard.

## 2019-09-10 ENCOUNTER — Encounter: Payer: 59 | Admitting: Endocrinology

## 2019-09-10 ENCOUNTER — Other Ambulatory Visit: Payer: Self-pay

## 2019-09-10 NOTE — Progress Notes (Signed)
This encounter was created in error - please disregard.

## 2019-09-10 NOTE — Telephone Encounter (Signed)
Pt returned phone call and provided me with two phone numbers for [facility], his pharmacy [employer]. I will contact and try and find out what is needed for Trulicity approval or what medications are preferred.  First phone number is [phone] and pharmacy help desk number is [phone].

## 2019-09-10 NOTE — Telephone Encounter (Signed)
Called pt's pharmacy and got prescription coverage information. Attempted to verify coverage through [email or web address] and website states that coverage could not be verified. Called pt and left voicemail requesting a call back to discuss further.

## 2019-09-11 ENCOUNTER — Encounter: Payer: 59 | Admitting: Endocrinology

## 2019-09-11 ENCOUNTER — Other Ambulatory Visit (INDEPENDENT_AMBULATORY_CARE_PROVIDER_SITE_OTHER): Payer: 59

## 2019-09-11 ENCOUNTER — Other Ambulatory Visit: Payer: Self-pay | Admitting: Endocrinology

## 2019-09-11 DIAGNOSIS — E1169 Type 2 diabetes mellitus with other specified complication: Secondary | ICD-10-CM | POA: Diagnosis not present

## 2019-09-11 DIAGNOSIS — R7989 Other specified abnormal findings of blood chemistry: Secondary | ICD-10-CM | POA: Diagnosis not present

## 2019-09-11 DIAGNOSIS — E669 Obesity, unspecified: Secondary | ICD-10-CM

## 2019-09-11 LAB — COMPREHENSIVE METABOLIC PANEL
ALT: 17 U/L (ref 0–53)
AST: 22 U/L (ref 0–37)
Albumin: 4.4 g/dL (ref 3.5–5.2)
Alkaline Phosphatase: 52 U/L (ref 39–117)
BUN: 15 mg/dL (ref 6–23)
CO2: 30 mEq/L (ref 19–32)
Calcium: 9.7 mg/dL (ref 8.4–10.5)
Chloride: 101 mEq/L (ref 96–112)
Creatinine, Ser: 1.02 mg/dL (ref 0.40–1.50)
GFR: 86.54 mL/min (ref >60.00)
Glucose, Bld: 92 mg/dL (ref 70–99)
Potassium: 3.6 mEq/L (ref 3.5–5.1)
Sodium: 138 mEq/L (ref 135–145)
Total Bilirubin: 0.7 mg/dL (ref 0.2–1.2)
Total Protein: 7.8 g/dL (ref 6.0–8.3)

## 2019-09-11 LAB — TESTOSTERONE: Testosterone: 480.66 ng/dL (ref 300.00–890.00)

## 2019-09-11 LAB — HEMOGLOBIN A1C: Hgb A1c MFr Bld: 6.1 % (ref 4.6–6.5)

## 2019-09-19 ENCOUNTER — Telehealth: Payer: 59 | Admitting: Endocrinology

## 2019-09-29 ENCOUNTER — Other Ambulatory Visit: Payer: Self-pay | Admitting: Endocrinology

## 2019-11-15 ENCOUNTER — Other Ambulatory Visit: Payer: Self-pay | Admitting: Endocrinology

## 2019-11-22 ENCOUNTER — Other Ambulatory Visit: Payer: Self-pay | Admitting: *Deleted

## 2019-11-22 ENCOUNTER — Other Ambulatory Visit: Payer: Self-pay | Admitting: Endocrinology

## 2019-11-22 MED ORDER — TRULICITY 1.5 MG/0.5ML ~~LOC~~ SOAJ: 1.5000 mg | SUBCUTANEOUS | 0 refills | Status: DC

## 2019-11-22 NOTE — Telephone Encounter (Signed)
Patient requests RX for Glimepiride 2MG   1 tablet daily.  Office notes show 1/2 tablet (1MG ) daily. LOV  05/13/19. Patient advised to follow up in 4 months. To date, no follow up has been scheduled. Please advise.

## 2019-11-22 NOTE — Telephone Encounter (Signed)
Check with patient what he's taking and also make an appointment with labs

## 2019-11-22 NOTE — Telephone Encounter (Signed)
Spoke with patient. He is taking 2MG  of the Glimepiride daily. Patient requested Trulicity Rx . Appointments have been scheduled for 12/17/19(labs) and 12/18/19.

## 2019-11-26 DIAGNOSIS — E1169 Type 2 diabetes mellitus with other specified complication: Secondary | ICD-10-CM | POA: Diagnosis not present

## 2019-11-26 DIAGNOSIS — D352 Benign neoplasm of pituitary gland: Secondary | ICD-10-CM | POA: Diagnosis not present

## 2019-11-26 DIAGNOSIS — R42 Dizziness and giddiness: Secondary | ICD-10-CM | POA: Diagnosis not present

## 2019-11-26 DIAGNOSIS — R202 Paresthesia of skin: Secondary | ICD-10-CM | POA: Diagnosis not present

## 2019-11-26 DIAGNOSIS — N50819 Testicular pain, unspecified: Secondary | ICD-10-CM | POA: Diagnosis not present

## 2019-11-26 DIAGNOSIS — M25519 Pain in unspecified shoulder: Secondary | ICD-10-CM | POA: Diagnosis not present

## 2019-11-26 DIAGNOSIS — Z0000 Encounter for general adult medical examination without abnormal findings: Secondary | ICD-10-CM | POA: Diagnosis not present

## 2019-11-26 DIAGNOSIS — I10 Essential (primary) hypertension: Secondary | ICD-10-CM | POA: Diagnosis not present

## 2019-11-26 DIAGNOSIS — E7800 Pure hypercholesterolemia, unspecified: Secondary | ICD-10-CM | POA: Diagnosis not present

## 2019-12-04 ENCOUNTER — Other Ambulatory Visit: Payer: Self-pay

## 2019-12-04 ENCOUNTER — Telehealth: Payer: Self-pay | Admitting: Endocrinology

## 2019-12-04 MED ORDER — CABERGOLINE 0.5 MG PO TABS: ORAL_TABLET | ORAL | 0 refills | Status: DC

## 2019-12-04 MED ORDER — PRAVASTATIN SODIUM 40 MG PO TABS: 40.0000 mg | ORAL_TABLET | Freq: Every day | ORAL | 0 refills | Status: DC

## 2019-12-04 MED ORDER — TADALAFIL 5 MG PO TABS: ORAL_TABLET | ORAL | 0 refills | Status: DC

## 2019-12-04 MED ORDER — POTASSIUM CHLORIDE CRYS ER 10 MEQ PO TBCR: 10.0000 meq | EXTENDED_RELEASE_TABLET | Freq: Every day | ORAL | 0 refills | Status: DC

## 2019-12-04 MED ORDER — ONETOUCH ULTRA VI STRP: ORAL_STRIP | 0 refills | Status: DC

## 2019-12-04 MED ORDER — GLIMEPIRIDE 2 MG PO TABS: ORAL_TABLET | ORAL | 0 refills | Status: DC

## 2019-12-04 MED ORDER — PIOGLITAZONE HCL-METFORMIN HCL 15-850 MG PO TABS: 1.0000 | ORAL_TABLET | Freq: Two times a day (BID) | ORAL | 0 refills | Status: DC

## 2019-12-04 MED ORDER — AMLODIPINE BESYLATE 10 MG PO TABS
10.0000 mg | ORAL_TABLET | Freq: Every day | ORAL | 0 refills | Status: DC
Start: 2019-12-04 — End: 2020-04-20

## 2019-12-04 MED ORDER — TRULICITY 1.5 MG/0.5ML ~~LOC~~ SOAJ: 1.5000 mg | SUBCUTANEOUS | 0 refills | Status: DC

## 2019-12-04 MED ORDER — OLMESARTAN MEDOXOMIL-HCTZ 40-12.5 MG PO TABS: 1.0000 | ORAL_TABLET | Freq: Every day | ORAL | 0 refills | Status: DC

## 2019-12-04 NOTE — Telephone Encounter (Signed)
Rx's have been sent. 

## 2019-12-04 NOTE — Telephone Encounter (Signed)
Medication Refill Request  . Did you call your pharmacy and request this refill first?YES  . If patient has not contacted pharmacy first, instruct them to do so for future refills.  . Remind them that contacting the pharmacy for their refill is the quickest method to get the refill.  . Refill policy also stated that it will take anywhere between 24-72 hours to receive the refill.    Name of medication? All medications prescribed by Dr Dwyane Dee need to be redone and sent to the [facility] (instead of local pharmacy)  Is this a 90 day supply? Yes, changing to 90 days on all per [facility]   Name and location of pharmacy? [facility]

## 2019-12-09 ENCOUNTER — Other Ambulatory Visit: Payer: Self-pay | Admitting: Endocrinology

## 2019-12-13 ENCOUNTER — Other Ambulatory Visit: Payer: Self-pay | Admitting: Endocrinology

## 2019-12-13 DIAGNOSIS — E782 Mixed hyperlipidemia: Secondary | ICD-10-CM

## 2019-12-13 DIAGNOSIS — E669 Obesity, unspecified: Secondary | ICD-10-CM

## 2019-12-13 DIAGNOSIS — D352 Benign neoplasm of pituitary gland: Secondary | ICD-10-CM

## 2019-12-17 ENCOUNTER — Other Ambulatory Visit (INDEPENDENT_AMBULATORY_CARE_PROVIDER_SITE_OTHER): Payer: Medicare HMO

## 2019-12-17 ENCOUNTER — Other Ambulatory Visit: Payer: Self-pay

## 2019-12-17 DIAGNOSIS — D352 Benign neoplasm of pituitary gland: Secondary | ICD-10-CM | POA: Diagnosis not present

## 2019-12-17 DIAGNOSIS — E1169 Type 2 diabetes mellitus with other specified complication: Secondary | ICD-10-CM | POA: Diagnosis not present

## 2019-12-17 DIAGNOSIS — E782 Mixed hyperlipidemia: Secondary | ICD-10-CM

## 2019-12-17 DIAGNOSIS — E669 Obesity, unspecified: Secondary | ICD-10-CM | POA: Diagnosis not present

## 2019-12-17 LAB — LIPID PANEL
Cholesterol: 151 mg/dL (ref 0–200)
HDL: 56.9 mg/dL (ref >39.00)
LDL Cholesterol: 69 mg/dL (ref 0–99)
NonHDL: 94.18
Total CHOL/HDL Ratio: 3
Triglycerides: 126 mg/dL (ref 0.0–149.0)
VLDL: 25.2 mg/dL (ref 0.0–40.0)

## 2019-12-17 LAB — COMPREHENSIVE METABOLIC PANEL
ALT: 12 U/L (ref 0–53)
AST: 18 U/L (ref 0–37)
Albumin: 4.4 g/dL (ref 3.5–5.2)
Alkaline Phosphatase: 50 U/L (ref 39–117)
BUN: 14 mg/dL (ref 6–23)
CO2: 30 mEq/L (ref 19–32)
Calcium: 9.9 mg/dL (ref 8.4–10.5)
Chloride: 102 mEq/L (ref 96–112)
Creatinine, Ser: 0.99 mg/dL (ref 0.40–1.50)
GFR: 89.51 mL/min (ref >60.00)
Glucose, Bld: 89 mg/dL (ref 70–99)
Potassium: 4.1 mEq/L (ref 3.5–5.1)
Sodium: 139 mEq/L (ref 135–145)
Total Bilirubin: 0.6 mg/dL (ref 0.2–1.2)
Total Protein: 7.7 g/dL (ref 6.0–8.3)

## 2019-12-17 LAB — HEMOGLOBIN A1C: Hgb A1c MFr Bld: 6.2 % (ref 4.6–6.5)

## 2019-12-18 ENCOUNTER — Encounter: Payer: Self-pay | Admitting: Endocrinology

## 2019-12-18 ENCOUNTER — Telehealth (INDEPENDENT_AMBULATORY_CARE_PROVIDER_SITE_OTHER): Payer: Medicare HMO | Admitting: Endocrinology

## 2019-12-18 VITALS — BP 128/78 | Ht 69.49 in | Wt 196.0 lb

## 2019-12-18 DIAGNOSIS — E1169 Type 2 diabetes mellitus with other specified complication: Secondary | ICD-10-CM

## 2019-12-18 DIAGNOSIS — E782 Mixed hyperlipidemia: Secondary | ICD-10-CM | POA: Diagnosis not present

## 2019-12-18 DIAGNOSIS — D352 Benign neoplasm of pituitary gland: Secondary | ICD-10-CM | POA: Diagnosis not present

## 2019-12-18 DIAGNOSIS — I10 Essential (primary) hypertension: Secondary | ICD-10-CM | POA: Diagnosis not present

## 2019-12-18 DIAGNOSIS — E669 Obesity, unspecified: Secondary | ICD-10-CM | POA: Diagnosis not present

## 2019-12-18 LAB — PROLACTIN: Prolactin: 7.9 ng/mL (ref 4.0–15.2)

## 2019-12-18 MED ORDER — ACCU-CHEK GUIDE VI STRP: ORAL_STRIP | 12 refills | Status: AC

## 2019-12-18 MED ORDER — IRBESARTAN-HYDROCHLOROTHIAZIDE 300-12.5 MG PO TABS: 1.0000 | ORAL_TABLET | Freq: Every day | ORAL | 1 refills | Status: DC

## 2019-12-18 MED ORDER — IRBESARTAN-HYDROCHLOROTHIAZIDE 300-12.5 MG PO TABS
1.0000 | ORAL_TABLET | Freq: Every day | ORAL | 1 refills | Status: DC
Start: 2019-12-18 — End: 2021-05-10

## 2019-12-18 MED ORDER — ACCU-CHEK GUIDE W/DEVICE KIT: 1.0000 | PACK | Freq: Every day | 0 refills | Status: AC

## 2019-12-18 NOTE — Progress Notes (Signed)
Patient ID: Joshua Vargas, male   DOB: 13-Nov-1945, 74 y.o.   MRN: [id number]  I connected with the above-named patient by video enabled telemedicine application and verified that I am speaking with the correct person. The patient was explained the limitations of evaluation and management by telemedicine and the availability of in person appointments.  Patient also understood that there may be a patient responsible charge related to this service . Location of the patient: Patient's home . Location of the provider: Physician office Only the patient and myself were participating in the encounter The patient understood the above statements and agreed to proceed.     Reason for Appointment: follow-up of various  problems  History of Present Illness   Type 2 DIABETES MELITUS, date of diagnosis: 2002      Previous history: He was initially treated with metformin alone and then subsequently was also given Actos and Amaryl.  He has had fairly good control but somewhat inconsistent because of difficulties with compliance with diet and exercise periodically. A1c previously has fluctuated between about 6.7-7.6 In 6/15 he was started on Victoza which he wanted to try because of difficulty losing weight and better control  Recent history:   Non-insulin hypoglycemic drugs: Actoplusmet 15/850 once a day, Amaryl 2 mg in evening, Trulicity 1.5 mg weekly           His A1c is about the same at 6.2   Current management, blood sugar patterns and problems identified:  He says he does not have a functioning meter and does not know what his blood sugars are  Also had some difficulty with coverage for his Trulicity and only recently is able to restart it with his new insurance  With not taking Trulicity he went up to 2 mg Amaryl in the evening, previously taking 1 mg  His lab fasting glucose was 89  Since last year his weight is still lower and has been able to maintain the weight loss  with Trulicity       Side effects from medications:  anorexia with 1.2 mg Victoza       Monitors blood glucose: Sporadically   Glucometer:  One Touch.           Blood Glucose readings not available  Previous readings:  PRE-MEAL Fasting Lunch  6:30 PM  overnight Overall  Glucose range:  83-107   114  80-125   Mean/median:        POST-MEAL PC Breakfast PC Lunch PC Dinner  Glucose range:    97  Mean/median:                    Dietician visit: Most recent: 06/2016            Wt Readings from Last 3 Encounters:  12/18/19 196 lb (88.9 kg)  08/08/18 217 lb (98.4 kg)  05/10/18 213 lb (96.6 kg)   LABS:  Lab Results  Component Value Date   HGBA1C 6.2 12/17/2019   HGBA1C 6.1 09/11/2019   HGBA1C 5.9 03/20/2019   Lab Results  Component Value Date   MICROALBUR 6.7 (H) 03/20/2019   LDLCALC 69 12/17/2019   CREATININE 0.99 12/17/2019     OTHER problems are discussed in review of systems   LABS:  Lab on 12/17/2019  Component Date Value Ref Range Status  . Prolactin 12/17/2019 7.9  4.0 - 15.2 ng/mL Final  . Cholesterol 12/17/2019 151  0 - 200 mg/dL Final   ATP III  Classification       Desirable:  < 200 mg/dL               Borderline High:  200 - 239 mg/dL          High:  > = 240 mg/dL  . Triglycerides 12/17/2019 126.0  0 - 149 mg/dL Final   Normal:  <150 mg/dLBorderline High:  150 - 199 mg/dL  . HDL 12/17/2019 56.90  >39.00 mg/dL Final  . VLDL 12/17/2019 25.2  0.0 - 40.0 mg/dL Final  . LDL Cholesterol 12/17/2019 69  0 - 99 mg/dL Final  . Total CHOL/HDL Ratio 12/17/2019 3   Final                  Men          Women1/2 Average Risk     3.4          3.3Average Risk          5.0          4.42X Average Risk          9.6          7.13X Average Risk          15.0          11.0                      . NonHDL 12/17/2019 94.18   Final   NOTE:  Non-HDL goal should be 30 mg/dL higher than patient's LDL goal (i.e. LDL goal of < 70 mg/dL, would have non-HDL goal of < 100 mg/dL)  . Sodium  12/17/2019 139  135 - 145 mEq/L Final  . Potassium 12/17/2019 4.1  3.5 - 5.1 mEq/L Final  . Chloride 12/17/2019 102  96 - 112 mEq/L Final  . CO2 12/17/2019 30  19 - 32 mEq/L Final  . Glucose, Bld 12/17/2019 89  70 - 99 mg/dL Final  . BUN 12/17/2019 14  6 - 23 mg/dL Final  . Creatinine, Ser 12/17/2019 0.99  0.40 - 1.50 mg/dL Final  . Total Bilirubin 12/17/2019 0.6  0.2 - 1.2 mg/dL Final  . Alkaline Phosphatase 12/17/2019 50  39 - 117 U/L Final  . AST 12/17/2019 18  0 - 37 U/L Final  . ALT 12/17/2019 12  0 - 53 U/L Final  . Total Protein 12/17/2019 7.7  6.0 - 8.3 g/dL Final  . Albumin 12/17/2019 4.4  3.5 - 5.2 g/dL Final  . GFR 12/17/2019 89.51  >60.00 mL/min Final  . Calcium 12/17/2019 9.9  8.4 - 10.5 mg/dL Final  . Hgb A1c MFr Bld 12/17/2019 6.2  4.6 - 6.5 % Final   Glycemic Control Guidelines for People with Diabetes:Non Diabetic:  <6%Goal of Therapy: <7%Additional Action Suggested:  >8%     Allergies as of 12/18/2019      Reactions   Atorvastatin Itching   Dextran Rash   Other Reaction: Not Assessed      Medication List       Accurate as of December 18, 2019  4:53 PM. If you have any questions, ask your nurse or doctor.        STOP taking these medications   olmesartan-hydrochlorothiazide 40-12.5 MG tablet Commonly known as: BENICAR HCT     TAKE these medications   Accu-Chek Guide test strip Generic drug: glucose blood Use as instructed What changed: additional instructions   Accu-Chek Guide w/Device Kit 1 Device by Does not  apply route daily.   acetaminophen 500 MG tablet Commonly known as: TYLENOL Take 500 mg by mouth as needed. pain   amLODipine 10 MG tablet Commonly known as: NORVASC Take 1 tablet (10 mg total) by mouth daily.   aspirin 81 MG EC tablet Take 81 mg by mouth daily.   cabergoline 0.5 MG tablet Commonly known as: DOSTINEX Take one half-tablet by mouth twice a week (on Wednesday and Saturday)   glimepiride 2 MG tablet Commonly known as:  AMARYL Take 1 tablet by mouth once daily every day at dinner.   irbesartan-hydrochlorothiazide 300-12.5 MG tablet Commonly known as: AVALIDE Take 1 tablet by mouth daily.   Lumigan 0.01 % Soln Generic drug: bimatoprost Place 1 drop into both eyes at bedtime.   pioglitazone-metformin 15-850 MG tablet Commonly known as: ACTOPLUS MET TAKE 1 TABLET TWICE DAILY WITH MEALS   potassium chloride 10 MEQ tablet Commonly known as: Klor-Con M10 Take 1 tablet (10 mEq total) by mouth daily.   pravastatin 40 MG tablet Commonly known as: PRAVACHOL Take 1 tablet (40 mg total) by mouth daily.   tadalafil 5 MG tablet Commonly known as: CIALIS TAKE 1 TABLET BY MOUTH DAILY AS NEEDED FOR ERECTILE DYSFUNCTION   Trulicity 1.5 NH/6.5BX Sopn Generic drug: Dulaglutide Inject 0.5 mLs (1.5 mg total) into the skin once a week. Inject 1.75m under the skin once weekly.   Xhance 93 MCG/ACT Exhu Generic drug: Fluticasone Propionate Place 2 sprays into the nose 2 (two) times daily.       Allergies:  Allergies  Allergen Reactions  . Atorvastatin Itching  . Dextran Rash    Other Reaction: Not Assessed    Past Medical History:  Diagnosis Date  . Coronary artery disease   . 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 Hyperlipemia   . Hypertension   . Pneumonia 1990s X 1; ~ 2000 X 1  . Type II diabetes mellitus ([facility]     Past Surgical History:  Procedure Laterality Date  . CARDIAC CATHETERIZATION  ?2006; 01/25/2018  . ELBOW SURGERY Left    "something to do w/the muscle"  . JOINT REPLACEMENT    . LEFT HEART CATH AND CORONARY ANGIOGRAPHY N/A 01/26/2018   Procedure: LEFT HEART CATH AND CORONARY ANGIOGRAPHY;  [place]: JMartinique Peter M, MD;  [facility];  Service: Cardiovascular;  Laterality: N/A;  . PICC LINE INSERTION  ~ 2005   "took it out months later"  . 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 TOTAL KNEE ARTHROPLASTY Left 2005  .  TRIGGER FINGER RELEASE Bilateral ~ 2016    Family History  Problem Relation Age of Onset  . Cancer Father   . Diabetes Brother   . Heart attack Brother 515 . Allergic rhinitis Neg Hx   . Angioedema Neg Hx   . Asthma Neg Hx   . Atopy Neg Hx   . Eczema Neg Hx   . Immunodeficiency Neg Hx   . Urticaria Neg Hx     Social History:  reports that he has never smoked. He has never used smokeless tobacco. He reports that he does not drink alcohol and does not use drugs.  Review of Systems:   Hypertension:   He has been on a regimen of Avapro/HCTZ 300/12.5 and Norvasc 10 mg He confirmed that is not taking doxazosin and he checked his medications today Unclear why his PCP is changing from Benicar HCTZ to Avalide He  is supposed to be on the new medication from PCP but unclear what this is  Last reading 128/78 and rarely reading diastolic may be up to 86  Potassium is normal with taking the10 mEq supplement  Lab Results  Component Value Date   CREATININE 0.99 12/17/2019   BUN 14 12/17/2019   NA 139 12/17/2019   K 4.1 12/17/2019   CL 102 12/17/2019   CO2 30 12/17/2019   DIZZINESS: He says that he feels dizzy when he gets up from stooping position This has been going on for several months now.  His PCP said it is from cabergoline He thinks he is not taking doxazosin now Does not check blood pressure standing  Lipids: He had been on Crestor and simvastatin and these were stopped because of increased liver functions Because of significantly high lipids on his last visit he was told to start pravastatin With this his LDL has been well controlled Lipids are improved with recent weight loss including triglycerides  Lab Results  Component Value Date   CHOL 151 12/17/2019   CHOL 130 03/20/2019   CHOL 154 08/21/2018   Lab Results  Component Value Date   HDL 56.90 12/17/2019   HDL 59.00 03/20/2019   HDL 56.40 08/21/2018   Lab Results  Component Value Date   LDLCALC 69  12/17/2019   LDLCALC 55 03/20/2019   LDLCALC 59 08/21/2018   Lab Results  Component Value Date   TRIG 126.0 12/17/2019   TRIG 82.0 03/20/2019   TRIG 192.0 (H) 08/21/2018   Lab Results  Component Value Date   CHOLHDL 3 12/17/2019   CHOLHDL 2 03/20/2019   CHOLHDL 3 08/21/2018   Lab Results  Component Value Date   LDLDIRECT 120.0 05/09/2018   LDLDIRECT 121.0 11/28/2017   LDLDIRECT 106.0 10/03/2017      Abnormal liver functions:  He has had mild increase in liver functions, possibly from fatty liver although his ultrasound was normal in 2011.  No recent history of excessive alcohol May possibly have had high ALT from statins in the past, currently on pravastatin  ALT has been normal recently  Lab Results  Component Value Date   ALT 12 12/17/2019      He has had a 4x 5-mm  prolactinoma since 2007 with baseline prolactin level of 101, treated with Dostinex .  He has been regular with this and prolactin has been normal consistently  Testosterone level has been normal with normalization of prolactin  Lab Results  Component Value Date   PROLACTIN 7.9 12/17/2019   PROLACTIN 5.4 03/20/2019   PROLACTIN 4.3 11/30/2018   PROLACTIN 11.8 05/09/2018   PROLACTIN 5.5 11/28/2017      Lab Results  Component Value Date   TESTOSTERONE 480.66 09/11/2019   Erectile dysfunction treated with Cialis     Examination:   BP 128/78   Ht 5' 9.49" (1.765 m)   Wt 196 lb (88.9 kg)   BMI 28.54 kg/m   Body mass index is 28.54 kg/m.    ASSESSMENT/ PLAN:   Diabetes type 2 with mild obesity  See history of present illness for detailed discussion of current diabetes management, blood sugar patterns and problems identified  His A1c is very stable at 6.2  Currently he is not checking his blood sugars Also since he just went back on his Trulicity he is taking higher dose of Amaryl but this may need to be reduced if he has low normal or low sugars Encouraged him to continue  efforts  to lose weight and let us know if he has difficulty with postprandial high blood sugars   Hypertension: blood pressure is appearing to be higher and now managed by his PCP Although he is now on Avalide instead of Benicar HCTZ blood pressure is likely not fully improved without additional medications May consider Aldactone and stop potassium as an alternative  Dizziness is postural and he needs to start checking his blood pressure standing up, may also be potentially from benign positional vertigo.  Unlikely to be from cabergoline  Hyperprolactinemia: Longstanding, controlled with cabergoline Since he had only a microadenoma and may be in remission will see if we can taper off his cabergoline He will reduce the dose to half tablet weekly and then stop if the prolactin is not high on the next visit  LIPIDS: Still well controlled and he will continue same regimen   He needs to send [place] reports of his last eye exam  Follow-up in 4 months  There are no Patient Instructions on file for this visit.   Elayne Snare 12/18/2019, 4:53 PM

## 2019-12-20 ENCOUNTER — Telehealth: Payer: Self-pay | Admitting: *Deleted

## 2019-12-20 ENCOUNTER — Telehealth: Payer: Self-pay | Admitting: Endocrinology

## 2019-12-20 NOTE — Telephone Encounter (Signed)
LMTCB to schedule 4 month follow up with labs

## 2019-12-20 NOTE — Telephone Encounter (Signed)
Please send Actos 30 mg once daily and Metformin 1 g twice daily.  Also we need to confirm his blood pressure medications

## 2019-12-20 NOTE — Telephone Encounter (Signed)
Received fax from pharmacy indicating prescription for pioglitazone-metformin [phone] must be filled separately. Insurance will only pay for these drugs separately. Please advise.

## 2019-12-23 ENCOUNTER — Other Ambulatory Visit: Payer: Self-pay | Admitting: *Deleted

## 2019-12-23 MED ORDER — METFORMIN HCL 1000 MG PO TABS
1000.0000 mg | ORAL_TABLET | Freq: Two times a day (BID) | ORAL | 0 refills | Status: DC
Start: 2019-12-23 — End: 2020-04-20

## 2019-12-23 MED ORDER — PIOGLITAZONE HCL 30 MG PO TABS: 30.0000 mg | ORAL_TABLET | Freq: Every day | ORAL | 0 refills | Status: DC

## 2019-12-23 NOTE — Telephone Encounter (Signed)
Prescription sent to [facility] for Actos 30 mg once daily and Metformin 1000 mg BID. Patient confirmed blood pressure medications to be Amlodipine 10mg  daily and Avalide 300-12.5 daily.

## 2020-01-08 DIAGNOSIS — G609 Hereditary and idiopathic neuropathy, unspecified: Secondary | ICD-10-CM | POA: Diagnosis not present

## 2020-01-08 DIAGNOSIS — R419 Unspecified symptoms and signs involving cognitive functions and awareness: Secondary | ICD-10-CM | POA: Diagnosis not present

## 2020-01-08 DIAGNOSIS — Z800 Family history of malignant neoplasm of digestive organs: Secondary | ICD-10-CM | POA: Insufficient documentation

## 2020-01-08 DIAGNOSIS — R202 Paresthesia of skin: Secondary | ICD-10-CM | POA: Insufficient documentation

## 2020-01-08 DIAGNOSIS — H43391 Other vitreous opacities, right eye: Secondary | ICD-10-CM | POA: Insufficient documentation

## 2020-01-08 DIAGNOSIS — E7800 Pure hypercholesterolemia, unspecified: Secondary | ICD-10-CM | POA: Insufficient documentation

## 2020-01-08 DIAGNOSIS — R200 Anesthesia of skin: Secondary | ICD-10-CM | POA: Diagnosis not present

## 2020-01-08 DIAGNOSIS — G5603 Carpal tunnel syndrome, bilateral upper limbs: Secondary | ICD-10-CM | POA: Diagnosis not present

## 2020-01-08 DIAGNOSIS — Z860100 Personal history of colon polyps, unspecified: Secondary | ICD-10-CM | POA: Insufficient documentation

## 2020-01-08 DIAGNOSIS — N400 Enlarged prostate without lower urinary tract symptoms: Secondary | ICD-10-CM | POA: Insufficient documentation

## 2020-01-08 DIAGNOSIS — IMO0002 Reserved for concepts with insufficient information to code with codable children: Secondary | ICD-10-CM | POA: Insufficient documentation

## 2020-01-08 DIAGNOSIS — K5900 Constipation, unspecified: Secondary | ICD-10-CM | POA: Insufficient documentation

## 2020-01-08 DIAGNOSIS — E1165 Type 2 diabetes mellitus with hyperglycemia: Secondary | ICD-10-CM | POA: Diagnosis not present

## 2020-01-08 DIAGNOSIS — M25569 Pain in unspecified knee: Secondary | ICD-10-CM | POA: Insufficient documentation

## 2020-01-15 DIAGNOSIS — G609 Hereditary and idiopathic neuropathy, unspecified: Secondary | ICD-10-CM | POA: Diagnosis not present

## 2020-01-15 DIAGNOSIS — G5621 Lesion of ulnar nerve, right upper limb: Secondary | ICD-10-CM | POA: Diagnosis not present

## 2020-01-15 DIAGNOSIS — G5613 Other lesions of median nerve, bilateral upper limbs: Secondary | ICD-10-CM | POA: Diagnosis not present

## 2020-01-15 DIAGNOSIS — R419 Unspecified symptoms and signs involving cognitive functions and awareness: Secondary | ICD-10-CM | POA: Diagnosis not present

## 2020-01-23 ENCOUNTER — Other Ambulatory Visit: Payer: Self-pay | Admitting: Endocrinology

## 2020-02-17 DIAGNOSIS — G5621 Lesion of ulnar nerve, right upper limb: Secondary | ICD-10-CM | POA: Insufficient documentation

## 2020-02-17 DIAGNOSIS — G5603 Carpal tunnel syndrome, bilateral upper limbs: Secondary | ICD-10-CM | POA: Insufficient documentation

## 2020-02-17 DIAGNOSIS — M5416 Radiculopathy, lumbar region: Secondary | ICD-10-CM | POA: Insufficient documentation

## 2020-02-19 DIAGNOSIS — M5416 Radiculopathy, lumbar region: Secondary | ICD-10-CM | POA: Diagnosis not present

## 2020-02-19 DIAGNOSIS — G5621 Lesion of ulnar nerve, right upper limb: Secondary | ICD-10-CM | POA: Diagnosis not present

## 2020-02-19 DIAGNOSIS — G5603 Carpal tunnel syndrome, bilateral upper limbs: Secondary | ICD-10-CM | POA: Diagnosis not present

## 2020-03-19 ENCOUNTER — Other Ambulatory Visit (INDEPENDENT_AMBULATORY_CARE_PROVIDER_SITE_OTHER): Payer: Medicare HMO

## 2020-03-19 ENCOUNTER — Other Ambulatory Visit: Payer: Self-pay

## 2020-03-19 DIAGNOSIS — D352 Benign neoplasm of pituitary gland: Secondary | ICD-10-CM | POA: Diagnosis not present

## 2020-03-19 DIAGNOSIS — E1169 Type 2 diabetes mellitus with other specified complication: Secondary | ICD-10-CM

## 2020-03-19 DIAGNOSIS — E669 Obesity, unspecified: Secondary | ICD-10-CM

## 2020-03-19 LAB — BASIC METABOLIC PANEL
BUN: 11 mg/dL (ref 6–23)
CO2: 31 mEq/L (ref 19–32)
Calcium: 9.2 mg/dL (ref 8.4–10.5)
Chloride: 103 mEq/L (ref 96–112)
Creatinine, Ser: 0.92 mg/dL (ref 0.40–1.50)
GFR: 82.31 mL/min (ref >60.00)
Glucose, Bld: 104 mg/dL — ABNORMAL HIGH (ref 70–99)
Potassium: 3.9 mEq/L (ref 3.5–5.1)
Sodium: 141 mEq/L (ref 135–145)

## 2020-03-19 LAB — MICROALBUMIN / CREATININE URINE RATIO
Creatinine,U: 219.4 mg/dL
Microalb Creat Ratio: 2.5 mg/g (ref 0.0–30.0)
Microalb, Ur: 5.5 mg/dL — ABNORMAL HIGH (ref 0.0–1.9)

## 2020-03-19 LAB — HEMOGLOBIN A1C: Hgb A1c MFr Bld: 5.7 % (ref 4.6–6.5)

## 2020-03-20 LAB — PROLACTIN: Prolactin: 7.1 ng/mL (ref 4.0–15.2)

## 2020-03-22 NOTE — Progress Notes (Deleted)
Patient ID: Joshua Vargas, male   DOB: 1945/06/29, 74 y.o.   MRN: [id number]  I connected with the above-named patient by video enabled telemedicine application and verified that I am speaking with the correct person. The patient was explained the limitations of evaluation and management by telemedicine and the availability of in person appointments.  Patient also understood that there may be a patient responsible charge related to this service . Location of the patient: Patient's home . Location of the provider: Physician office Only the patient and myself were participating in the encounter The patient understood the above statements and agreed to proceed.     Reason for Appointment: follow-up of various  problems  History of Present Illness   Type 2 DIABETES MELITUS, date of diagnosis: 2002      Previous history: He was initially treated with metformin alone and then subsequently was also given Actos and Amaryl.  He has had fairly good control but somewhat inconsistent because of difficulties with compliance with diet and exercise periodically. A1c previously has fluctuated between about 6.7-7.6 In 6/15 he was started on Victoza which he wanted to try because of difficulty losing weight and better control  Recent history:   Non-insulin hypoglycemic drugs: Actoplusmet 15/850 once a day, Amaryl 2 mg in evening, Trulicity 1.5 mg weekly           His A1c is about the same at 6.2   Current management, blood sugar patterns and problems identified:  He says he does not have a functioning meter and does not know what his blood sugars are  Also had some difficulty with coverage for his Trulicity and only recently is able to restart it with his new insurance  With not taking Trulicity he went up to 2 mg Amaryl in the evening, previously taking 1 mg  His lab fasting glucose was 89  Since last year his weight is still lower and has been able to maintain the weight loss  with Trulicity       Side effects from medications:  anorexia with 1.2 mg Victoza       Monitors blood glucose: Sporadically   Glucometer:  One Touch.           Blood Glucose readings not available  Previous readings:  PRE-MEAL Fasting Lunch  6:30 PM  overnight Overall  Glucose range:  83-107   114  80-125   Mean/median:        POST-MEAL PC Breakfast PC Lunch PC Dinner  Glucose range:    97  Mean/median:                    Dietician visit: Most recent: 06/2016            Wt Readings from Last 3 Encounters:  12/18/19 196 lb (88.9 kg)  08/08/18 217 lb (98.4 kg)  05/10/18 213 lb (96.6 kg)   LABS:  Lab Results  Component Value Date   HGBA1C 5.7 03/19/2020   HGBA1C 6.2 12/17/2019   HGBA1C 6.1 09/11/2019   Lab Results  Component Value Date   MICROALBUR 5.5 (H) 03/19/2020   LDLCALC 69 12/17/2019   CREATININE 0.92 03/19/2020     OTHER problems are discussed in review of systems   LABS:  Lab on 03/19/2020  Component Date Value Ref Range Status  . Prolactin 03/19/2020 7.1  4.0 - 15.2 ng/mL Final  . Microalb, Ur 03/19/2020 5.5* 0.0 - 1.9 mg/dL Final  . Creatinine,U 03/19/2020  219.4  mg/dL Final  . Microalb Creat Ratio 03/19/2020 2.5  0.0 - 30.0 mg/g Final  . Sodium 03/19/2020 141  135 - 145 mEq/L Final  . Potassium 03/19/2020 3.9  3.5 - 5.1 mEq/L Final  . Chloride 03/19/2020 103  96 - 112 mEq/L Final  . CO2 03/19/2020 31  19 - 32 mEq/L Final  . Glucose, Bld 03/19/2020 104* 70 - 99 mg/dL Final  . BUN 03/19/2020 11  6 - 23 mg/dL Final  . Creatinine, Ser 03/19/2020 0.92  0.40 - 1.50 mg/dL Final  . GFR 03/19/2020 82.31  >60.00 mL/min Final   Calculated using the CKD-EPI Creatinine Equation (2021)  . Calcium 03/19/2020 9.2  8.4 - 10.5 mg/dL Final  . Hgb A1c MFr Bld 03/19/2020 5.7  4.6 - 6.5 % Final   Glycemic Control Guidelines for People with Diabetes:Non Diabetic:  <6%Goal of Therapy: <7%Additional Action Suggested:  >8%     Allergies as of 03/23/2020       Reactions   Atorvastatin Itching   Dextran Rash   Other Reaction: Not Assessed      Medication List       Accurate as of March 22, 2020  9:05 PM. If you have any questions, ask your nurse or doctor.        Accu-Chek Guide test strip Generic drug: glucose blood Use as instructed   Accu-Chek Guide w/Device Kit 1 Device by Does not apply route daily.   acetaminophen 500 MG tablet Commonly known as: TYLENOL Take 500 mg by mouth as needed. pain   amLODipine 10 MG tablet Commonly known as: NORVASC Take 1 tablet (10 mg total) by mouth daily.   aspirin 81 MG EC tablet Take 81 mg by mouth daily.   cabergoline 0.5 MG tablet Commonly known as: DOSTINEX Take one half-tablet by mouth twice a week (on Wednesday and Saturday)   glimepiride 2 MG tablet Commonly known as: AMARYL Take 1 tablet by mouth once daily every day at dinner.   irbesartan-hydrochlorothiazide 300-12.5 MG tablet Commonly known as: AVALIDE Take 1 tablet by mouth daily.   Lumigan 0.01 % Soln Generic drug: bimatoprost Place 1 drop into both eyes at bedtime.   metFORMIN 1000 MG tablet Commonly known as: GLUCOPHAGE Take 1 tablet (1,000 mg total) by mouth 2 (two) times daily with a meal.   pioglitazone 30 MG tablet Commonly known as: ACTOS Take 1 tablet (30 mg total) by mouth daily.   potassium chloride 10 MEQ tablet Commonly known as: Klor-Con M10 Take 1 tablet (10 mEq total) by mouth daily.   pravastatin 40 MG tablet Commonly known as: PRAVACHOL Take 1 tablet (40 mg total) by mouth daily.   tadalafil 5 MG tablet Commonly known as: CIALIS TAKE 1 TABLET BY MOUTH DAILY AS NEEDED FOR ERECTILE DYSFUNCTION   Trulicity 1.5 AO/1.3YQ Sopn Generic drug: Dulaglutide INJECT 0.5 MLS (1.5 MG TOTAL) INTO THE SKIN ONCE A WEEK. INJECT 1.5MG UNDER THE SKIN ONCE WEEKLY.   Xhance 93 MCG/ACT Exhu Generic drug: Fluticasone Propionate Place 2 sprays into the nose 2 (two) times daily.       Allergies:   Allergies  Allergen Reactions  . Atorvastatin Itching  . Dextran Rash    Other Reaction: Not Assessed    Past Medical History:  Diagnosis Date  . Coronary artery disease   . 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 Hyperlipemia   . Hypertension   . Pneumonia 1990s X 1; ~ 2000 X  1  . Type II diabetes mellitus ([place])     Past Surgical History:  Procedure Laterality Date  . CARDIAC CATHETERIZATION  ?2006; 01/25/2018  . ELBOW SURGERY Left    "something to do w/the muscle"  . JOINT REPLACEMENT    . LEFT HEART CATH AND CORONARY ANGIOGRAPHY N/A 01/26/2018   Procedure: LEFT HEART CATH AND CORONARY ANGIOGRAPHY;  [place]: Martinique, Peter M, MD;  Location: [place] [facility];  Service: Cardiovascular;  Laterality: N/A;  . PICC LINE INSERTION  ~ 2005   "took it out months later"  . 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 TOTAL KNEE ARTHROPLASTY Left 2005  . TRIGGER FINGER RELEASE Bilateral ~ 2016    Family History  Problem Relation Age of Onset  . Cancer Father   . Diabetes Brother   . Heart attack Brother 42  . Allergic rhinitis Neg Hx   . Angioedema Neg Hx   . Asthma Neg Hx   . Atopy Neg Hx   . Eczema Neg Hx   . Immunodeficiency Neg Hx   . Urticaria Neg Hx     Social History:  reports that he has never smoked. He has never used smokeless tobacco. He reports that he does not drink alcohol and does not use drugs.  Review of Systems:   Hypertension:   He has been on a regimen of Avapro/HCTZ 300/12.5 and Norvasc 10 mg He confirmed that is not taking doxazosin and he checked his medications today Unclear why his PCP is changing from Benicar HCTZ to Avalide He is supposed to be on the new medication from PCP but unclear what this is  Last reading 128/78 and rarely reading diastolic may be up to 86  Potassium is normal with taking the10 mEq supplement  Lab Results  Component Value Date   CREATININE 0.92 03/19/2020   BUN 11  03/19/2020   NA 141 03/19/2020   K 3.9 03/19/2020   CL 103 03/19/2020   CO2 31 03/19/2020   DIZZINESS: He says that he feels dizzy when he gets up from stooping position This has been going on for several months now.  His PCP said it is from cabergoline He thinks he is not taking doxazosin now Does not check blood pressure standing  Lipids: He had been on Crestor and simvastatin and these were stopped because of increased liver functions Because of significantly high lipids on his last visit he was told to start pravastatin With this his LDL has been well controlled Lipids are improved with recent weight loss including triglycerides  Lab Results  Component Value Date   CHOL 151 12/17/2019   CHOL 130 03/20/2019   CHOL 154 08/21/2018   Lab Results  Component Value Date   HDL 56.90 12/17/2019   HDL 59.00 03/20/2019   HDL 56.40 08/21/2018   Lab Results  Component Value Date   LDLCALC 69 12/17/2019   LDLCALC 55 03/20/2019   LDLCALC 59 08/21/2018   Lab Results  Component Value Date   TRIG 126.0 12/17/2019   TRIG 82.0 03/20/2019   TRIG 192.0 (H) 08/21/2018   Lab Results  Component Value Date   CHOLHDL 3 12/17/2019   CHOLHDL 2 03/20/2019   CHOLHDL 3 08/21/2018   Lab Results  Component Value Date   LDLDIRECT 120.0 05/09/2018   LDLDIRECT 121.0 11/28/2017   LDLDIRECT 106.0 10/03/2017      Abnormal liver functions:  He has had mild increase in liver functions, possibly  from fatty liver although his ultrasound was normal in 2011.  No recent history of excessive alcohol May possibly have had high ALT from statins in the past, currently on pravastatin  ALT has been normal recently  Lab Results  Component Value Date   ALT 12 12/17/2019      He has had a 4x 5-mm  prolactinoma since 2007 with baseline prolactin level of 101, treated with Dostinex .  He has been regular with this and prolactin has been normal consistently  Testosterone level has been normal with  normalization of prolactin  Lab Results  Component Value Date   PROLACTIN 7.1 03/19/2020   PROLACTIN 7.9 12/17/2019   PROLACTIN 5.4 03/20/2019   PROLACTIN 4.3 11/30/2018   PROLACTIN 11.8 05/09/2018      Lab Results  Component Value Date   TESTOSTERONE 480.66 09/11/2019   Erectile dysfunction treated with Cialis     Examination:   There were no vitals taken for this visit.  There is no height or weight on file to calculate BMI.    ASSESSMENT/ PLAN:   Diabetes type 2 with mild obesity  See history of present illness for detailed discussion of current diabetes management, blood sugar patterns and problems identified  His A1c is very stable at 6.2  Currently he is not checking his blood sugars Also since he just went back on his Trulicity he is taking higher dose of Amaryl but this may need to be reduced if he has low normal or low sugars Encouraged him to continue efforts to lose weight and let us know if he has difficulty with postprandial high blood sugars   Hypertension: blood pressure is appearing to be higher and now managed by his PCP Although he is now on Avalide instead of Benicar HCTZ blood pressure is likely not fully improved without additional medications May consider Aldactone and stop potassium as an alternative  Dizziness is postural and he needs to start checking his blood pressure standing up, may also be potentially from benign positional vertigo.  Unlikely to be from cabergoline  Hyperprolactinemia: Longstanding, controlled with cabergoline Since he had only a microadenoma and may be in remission will see if we can taper off his cabergoline He will reduce the dose to half tablet weekly and then stop if the prolactin is not high on the next visit  LIPIDS: Still well controlled and he will continue same regimen   He needs to send [place] reports of his last eye exam  Follow-up in 4 months  There are no Patient Instructions on file for this  visit.   Elayne Snare 03/22/2020, 9:05 PM

## 2020-03-23 ENCOUNTER — Ambulatory Visit: Payer: Medicare HMO | Admitting: Endocrinology

## 2020-03-23 DIAGNOSIS — G5603 Carpal tunnel syndrome, bilateral upper limbs: Secondary | ICD-10-CM | POA: Diagnosis not present

## 2020-03-23 DIAGNOSIS — G5621 Lesion of ulnar nerve, right upper limb: Secondary | ICD-10-CM | POA: Diagnosis not present

## 2020-03-30 ENCOUNTER — Other Ambulatory Visit: Payer: Self-pay | Admitting: Endocrinology

## 2020-04-20 ENCOUNTER — Other Ambulatory Visit: Payer: Self-pay | Admitting: Endocrinology

## 2020-04-24 ENCOUNTER — Other Ambulatory Visit: Payer: Self-pay | Admitting: Endocrinology

## 2020-05-10 DIAGNOSIS — Z20822 Contact with and (suspected) exposure to covid-19: Secondary | ICD-10-CM | POA: Diagnosis not present

## 2020-05-21 DIAGNOSIS — R3915 Urgency of urination: Secondary | ICD-10-CM | POA: Diagnosis not present

## 2020-05-21 DIAGNOSIS — R3912 Poor urinary stream: Secondary | ICD-10-CM | POA: Diagnosis not present

## 2020-05-21 DIAGNOSIS — N401 Enlarged prostate with lower urinary tract symptoms: Secondary | ICD-10-CM | POA: Diagnosis not present

## 2020-05-21 DIAGNOSIS — R350 Frequency of micturition: Secondary | ICD-10-CM | POA: Diagnosis not present

## 2020-05-21 DIAGNOSIS — R102 Pelvic and perineal pain: Secondary | ICD-10-CM | POA: Diagnosis not present

## 2020-05-28 DIAGNOSIS — E1169 Type 2 diabetes mellitus with other specified complication: Secondary | ICD-10-CM | POA: Diagnosis not present

## 2020-05-28 DIAGNOSIS — Z7984 Long term (current) use of oral hypoglycemic drugs: Secondary | ICD-10-CM | POA: Diagnosis not present

## 2020-05-28 DIAGNOSIS — E7800 Pure hypercholesterolemia, unspecified: Secondary | ICD-10-CM | POA: Diagnosis not present

## 2020-05-28 DIAGNOSIS — R944 Abnormal results of kidney function studies: Secondary | ICD-10-CM | POA: Diagnosis not present

## 2020-05-28 DIAGNOSIS — I10 Essential (primary) hypertension: Secondary | ICD-10-CM | POA: Diagnosis not present

## 2020-05-28 DIAGNOSIS — R413 Other amnesia: Secondary | ICD-10-CM | POA: Diagnosis not present

## 2020-05-28 DIAGNOSIS — D352 Benign neoplasm of pituitary gland: Secondary | ICD-10-CM | POA: Diagnosis not present

## 2020-05-28 DIAGNOSIS — M65351 Trigger finger, right little finger: Secondary | ICD-10-CM | POA: Diagnosis not present

## 2020-05-28 DIAGNOSIS — B351 Tinea unguium: Secondary | ICD-10-CM | POA: Diagnosis not present

## 2020-06-04 DIAGNOSIS — R102 Pelvic and perineal pain: Secondary | ICD-10-CM | POA: Diagnosis not present

## 2020-06-04 DIAGNOSIS — M62838 Other muscle spasm: Secondary | ICD-10-CM | POA: Diagnosis not present

## 2020-06-04 DIAGNOSIS — M65351 Trigger finger, right little finger: Secondary | ICD-10-CM | POA: Diagnosis not present

## 2020-06-04 DIAGNOSIS — R3912 Poor urinary stream: Secondary | ICD-10-CM | POA: Diagnosis not present

## 2020-06-04 DIAGNOSIS — R3915 Urgency of urination: Secondary | ICD-10-CM | POA: Diagnosis not present

## 2020-06-04 DIAGNOSIS — M6281 Muscle weakness (generalized): Secondary | ICD-10-CM | POA: Diagnosis not present

## 2020-06-04 DIAGNOSIS — R350 Frequency of micturition: Secondary | ICD-10-CM | POA: Diagnosis not present

## 2020-06-04 DIAGNOSIS — M6289 Other specified disorders of muscle: Secondary | ICD-10-CM | POA: Diagnosis not present

## 2020-06-16 DIAGNOSIS — M62838 Other muscle spasm: Secondary | ICD-10-CM | POA: Diagnosis not present

## 2020-06-16 DIAGNOSIS — M6289 Other specified disorders of muscle: Secondary | ICD-10-CM | POA: Diagnosis not present

## 2020-06-16 DIAGNOSIS — R3915 Urgency of urination: Secondary | ICD-10-CM | POA: Diagnosis not present

## 2020-06-16 DIAGNOSIS — R102 Pelvic and perineal pain: Secondary | ICD-10-CM | POA: Diagnosis not present

## 2020-06-16 DIAGNOSIS — M6281 Muscle weakness (generalized): Secondary | ICD-10-CM | POA: Diagnosis not present

## 2020-06-16 DIAGNOSIS — R3912 Poor urinary stream: Secondary | ICD-10-CM | POA: Diagnosis not present

## 2020-06-16 DIAGNOSIS — R350 Frequency of micturition: Secondary | ICD-10-CM | POA: Diagnosis not present

## 2020-07-01 DIAGNOSIS — G5603 Carpal tunnel syndrome, bilateral upper limbs: Secondary | ICD-10-CM | POA: Diagnosis not present

## 2020-07-09 DIAGNOSIS — M792 Neuralgia and neuritis, unspecified: Secondary | ICD-10-CM | POA: Diagnosis not present

## 2020-07-09 DIAGNOSIS — G5603 Carpal tunnel syndrome, bilateral upper limbs: Secondary | ICD-10-CM | POA: Diagnosis not present

## 2020-08-17 ENCOUNTER — Other Ambulatory Visit: Payer: Medicare HMO

## 2020-08-18 ENCOUNTER — Other Ambulatory Visit: Payer: Self-pay | Admitting: Endocrinology

## 2020-08-18 ENCOUNTER — Other Ambulatory Visit: Payer: Self-pay

## 2020-08-18 ENCOUNTER — Other Ambulatory Visit (INDEPENDENT_AMBULATORY_CARE_PROVIDER_SITE_OTHER): Payer: Medicare HMO

## 2020-08-18 DIAGNOSIS — E1169 Type 2 diabetes mellitus with other specified complication: Secondary | ICD-10-CM

## 2020-08-18 DIAGNOSIS — E669 Obesity, unspecified: Secondary | ICD-10-CM

## 2020-08-18 DIAGNOSIS — E782 Mixed hyperlipidemia: Secondary | ICD-10-CM

## 2020-08-18 DIAGNOSIS — R7989 Other specified abnormal findings of blood chemistry: Secondary | ICD-10-CM

## 2020-08-18 DIAGNOSIS — D352 Benign neoplasm of pituitary gland: Secondary | ICD-10-CM

## 2020-08-18 LAB — CBC WITH DIFFERENTIAL/PLATELET
Basophils Absolute: 0 10*3/uL (ref 0.0–0.1)
Basophils Relative: 0.8 % (ref 0.0–3.0)
Eosinophils Absolute: 0.2 10*3/uL (ref 0.0–0.7)
Eosinophils Relative: 5.1 % — ABNORMAL HIGH (ref 0.0–5.0)
HCT: 42.7 % (ref 39.0–52.0)
Hemoglobin: 14 g/dL (ref 13.0–17.0)
Lymphocytes Relative: 34 % (ref 12.0–46.0)
Lymphs Abs: 1.2 10*3/uL (ref 0.7–4.0)
MCHC: 32.8 g/dL (ref 30.0–36.0)
MCV: 86 fl (ref 78.0–100.0)
Monocytes Absolute: 0.3 10*3/uL (ref 0.1–1.0)
Monocytes Relative: 8.8 % (ref 3.0–12.0)
Neutro Abs: 1.8 10*3/uL (ref 1.4–7.7)
Neutrophils Relative %: 51.3 % (ref 43.0–77.0)
Platelets: 191 10*3/uL (ref 150.0–400.0)
RBC: 4.97 Mil/uL (ref 4.22–5.81)
RDW: 14.2 % (ref 11.5–15.5)
WBC: 3.5 10*3/uL — ABNORMAL LOW (ref 4.0–10.5)

## 2020-08-18 LAB — COMPREHENSIVE METABOLIC PANEL
ALT: 20 U/L (ref 0–53)
AST: 25 U/L (ref 0–37)
Albumin: 4.1 g/dL (ref 3.5–5.2)
Alkaline Phosphatase: 69 U/L (ref 39–117)
BUN: 11 mg/dL (ref 6–23)
CO2: 33 mEq/L — ABNORMAL HIGH (ref 19–32)
Calcium: 9.4 mg/dL (ref 8.4–10.5)
Chloride: 100 mEq/L (ref 96–112)
Creatinine, Ser: 0.91 mg/dL (ref 0.40–1.50)
GFR: 83.15 mL/min (ref >60.00)
Glucose, Bld: 157 mg/dL — ABNORMAL HIGH (ref 70–99)
Potassium: 4.3 mEq/L (ref 3.5–5.1)
Sodium: 139 mEq/L (ref 135–145)
Total Bilirubin: 0.5 mg/dL (ref 0.2–1.2)
Total Protein: 7.5 g/dL (ref 6.0–8.3)

## 2020-08-18 LAB — LIPID PANEL
Cholesterol: 160 mg/dL (ref 0–200)
HDL: 65.3 mg/dL (ref >39.00)
LDL Cholesterol: 68 mg/dL (ref 0–99)
NonHDL: 94.59
Total CHOL/HDL Ratio: 2
Triglycerides: 135 mg/dL (ref 0.0–149.0)
VLDL: 27 mg/dL (ref 0.0–40.0)

## 2020-08-18 LAB — HEMOGLOBIN A1C: Hgb A1c MFr Bld: 6.2 % (ref 4.6–6.5)

## 2020-08-18 LAB — TESTOSTERONE: Testosterone: 477.92 ng/dL (ref 300.00–890.00)

## 2020-08-19 LAB — PROLACTIN: Prolactin: 9 ng/mL (ref 4.0–15.2)

## 2020-08-20 ENCOUNTER — Encounter: Payer: Self-pay | Admitting: Endocrinology

## 2020-08-20 ENCOUNTER — Ambulatory Visit: Payer: Medicare HMO | Admitting: Endocrinology

## 2020-08-20 ENCOUNTER — Other Ambulatory Visit: Payer: Self-pay

## 2020-08-20 VITALS — BP 154/82 | HR 72 | Ht 69.0 in | Wt 194.6 lb

## 2020-08-20 DIAGNOSIS — R3915 Urgency of urination: Secondary | ICD-10-CM | POA: Diagnosis not present

## 2020-08-20 DIAGNOSIS — I10 Essential (primary) hypertension: Secondary | ICD-10-CM | POA: Diagnosis not present

## 2020-08-20 DIAGNOSIS — E1165 Type 2 diabetes mellitus with hyperglycemia: Secondary | ICD-10-CM | POA: Diagnosis not present

## 2020-08-20 DIAGNOSIS — E782 Mixed hyperlipidemia: Secondary | ICD-10-CM | POA: Diagnosis not present

## 2020-08-20 DIAGNOSIS — N401 Enlarged prostate with lower urinary tract symptoms: Secondary | ICD-10-CM | POA: Diagnosis not present

## 2020-08-20 DIAGNOSIS — L605 Yellow nail syndrome: Secondary | ICD-10-CM | POA: Diagnosis not present

## 2020-08-20 DIAGNOSIS — R351 Nocturia: Secondary | ICD-10-CM | POA: Diagnosis not present

## 2020-08-20 DIAGNOSIS — B351 Tinea unguium: Secondary | ICD-10-CM | POA: Diagnosis not present

## 2020-08-20 DIAGNOSIS — R3912 Poor urinary stream: Secondary | ICD-10-CM | POA: Diagnosis not present

## 2020-08-20 DIAGNOSIS — L603 Nail dystrophy: Secondary | ICD-10-CM | POA: Diagnosis not present

## 2020-08-20 NOTE — Progress Notes (Signed)
Patient ID: Joshua Vargas, male   DOB: Oct 30, 1945, 75 y.o.   MRN: [id number]    Reason for Appointment: follow-up of various  problems  History of Present Illness   Type 2 DIABETES MELITUS, date of diagnosis: 2002      Previous history: He was initially treated with metformin alone and then subsequently was also given Actos and Amaryl.  He has had fairly good control but somewhat inconsistent because of difficulties with compliance with diet and exercise periodically. A1c previously has fluctuated between about 6.7-7.6 In 6/15 he was started on Victoza which he wanted to try because of difficulty losing weight and better control  Recent history:   Non-insulin hypoglycemic drugs: Actoplusmet 15/850 once a day, Amaryl 2 mg in evening, Trulicity 1.5 mg weekly           His A1c is about the same at 6.2 He did not follow-up after his A1c result of 5.7 done in 12/21  Current management, blood sugar patterns and problems identified:  He has not checked his blood sugars  Recently because of his wife's recent medical issues he has not taken any of his medications regularly and has not taken any Trulicity for about a month  Also not exercising  His lab fasting glucose was 157  Also he said that sometimes with taking Amaryl he will feel hypoglycemic in the morning and blood sugars will be in the 60s  However his weight has been maintained at a lower level and he is still trying to monitor his portions      Side effects from medications:  anorexia with 1.2 mg Victoza       Monitors blood glucose: Sporadically   Glucometer:  One Touch.           Blood Glucose readings not available                Dietician visit: Most recent: 06/2016            Wt Readings from Last 3 Encounters:  08/20/20 194 lb 9.6 oz (88.3 kg)  12/18/19 196 lb (88.9 kg)  08/08/18 217 lb (98.4 kg)   LABS:  Lab Results  Component Value Date   HGBA1C 6.2 08/18/2020   HGBA1C 5.7 03/19/2020    HGBA1C 6.2 12/17/2019   Lab Results  Component Value Date   MICROALBUR 5.5 (H) 03/19/2020   LDLCALC 68 08/18/2020   CREATININE 0.91 08/18/2020     OTHER problems are discussed in review of systems   LABS:  Lab on 08/18/2020  Component Date Value Ref Range Status  . Testosterone 08/18/2020 477.92  300.00 - 890.00 ng/dL Final  . Prolactin 08/18/2020 9.0  4.0 - 15.2 ng/mL Final  . WBC 08/18/2020 3.5* 4.0 - 10.5 K/uL Final  . RBC 08/18/2020 4.97  4.22 - 5.81 Mil/uL Final  . Hemoglobin 08/18/2020 14.0  13.0 - 17.0 g/dL Final  . HCT 08/18/2020 42.7  39.0 - 52.0 % Final  . MCV 08/18/2020 86.0  78.0 - 100.0 fl Final  . MCHC 08/18/2020 32.8  30.0 - 36.0 g/dL Final  . RDW 08/18/2020 14.2  11.5 - 15.5 % Final  . Platelets 08/18/2020 191.0  150.0 - 400.0 K/uL Final  . Neutrophils Relative % 08/18/2020 51.3  43.0 - 77.0 % Final  . Lymphocytes Relative 08/18/2020 34.0  12.0 - 46.0 % Final  . Monocytes Relative 08/18/2020 8.8  3.0 - 12.0 % Final  . Eosinophils Relative 08/18/2020 5.1* 0.0 -  5.0 % Final  . Basophils Relative 08/18/2020 0.8  0.0 - 3.0 % Final  . Neutro Abs 08/18/2020 1.8  1.4 - 7.7 K/uL Final  . Lymphs Abs 08/18/2020 1.2  0.7 - 4.0 K/uL Final  . Monocytes Absolute 08/18/2020 0.3  0.1 - 1.0 K/uL Final  . Eosinophils Absolute 08/18/2020 0.2  0.0 - 0.7 K/uL Final  . Basophils Absolute 08/18/2020 0.0  0.0 - 0.1 K/uL Final  . Cholesterol 08/18/2020 160  0 - 200 mg/dL Final   ATP III Classification       Desirable:  < 200 mg/dL               Borderline High:  200 - 239 mg/dL          High:  > = 240 mg/dL  . Triglycerides 08/18/2020 135.0  0.0 - 149.0 mg/dL Final   Normal:  <150 mg/dLBorderline High:  150 - 199 mg/dL  . HDL 08/18/2020 65.30  >39.00 mg/dL Final  . VLDL 08/18/2020 27.0  0.0 - 40.0 mg/dL Final  . LDL Cholesterol 08/18/2020 68  0 - 99 mg/dL Final  . Total CHOL/HDL Ratio 08/18/2020 2   Final                  Men          Women1/2 Average Risk     3.4           3.3Average Risk          5.0          4.42X Average Risk          9.6          7.13X Average Risk          15.0          11.0                      . NonHDL 08/18/2020 94.59   Final   NOTE:  Non-HDL goal should be 30 mg/dL higher than patient's LDL goal (i.e. LDL goal of < 70 mg/dL, would have non-HDL goal of < 100 mg/dL)  . Sodium 08/18/2020 139  135 - 145 mEq/L Final  . Potassium 08/18/2020 4.3  3.5 - 5.1 mEq/L Final  . Chloride 08/18/2020 100  96 - 112 mEq/L Final  . CO2 08/18/2020 33* 19 - 32 mEq/L Final  . Glucose, Bld 08/18/2020 157* 70 - 99 mg/dL Final  . BUN 08/18/2020 11  6 - 23 mg/dL Final  . Creatinine, Ser 08/18/2020 0.91  0.40 - 1.50 mg/dL Final  . Total Bilirubin 08/18/2020 0.5  0.2 - 1.2 mg/dL Final  . Alkaline Phosphatase 08/18/2020 69  39 - 117 U/L Final  . AST 08/18/2020 25  0 - 37 U/L Final  . ALT 08/18/2020 20  0 - 53 U/L Final  . Total Protein 08/18/2020 7.5  6.0 - 8.3 g/dL Final  . Albumin 08/18/2020 4.1  3.5 - 5.2 g/dL Final  . GFR 08/18/2020 83.15  >60.00 mL/min Final   Calculated using the CKD-EPI Creatinine Equation (2021)  . Calcium 08/18/2020 9.4  8.4 - 10.5 mg/dL Final  . Hgb A1c MFr Bld 08/18/2020 6.2  4.6 - 6.5 % Final   Glycemic Control Guidelines for People with Diabetes:Non Diabetic:  <6%Goal of Therapy: <7%Additional Action Suggested:  >8%     Allergies as of 08/20/2020      Reactions   Atorvastatin Itching  Dextran Rash   Other Reaction: Not Assessed      Medication List       Accurate as of Aug 20, 2020 11:31 AM. If you have any questions, ask your nurse or doctor.        Accu-Chek Guide test strip Generic drug: glucose blood Use as instructed   Accu-Chek Guide w/Device Kit 1 Device by Does not apply route daily.   acetaminophen 500 MG tablet Commonly known as: TYLENOL Take 500 mg by mouth as needed. pain   amLODipine 10 MG tablet Commonly known as: NORVASC TAKE 1 TABLET EVERY DAY   aspirin 81 MG EC tablet Take 81 mg by mouth  daily.   cabergoline 0.5 MG tablet Commonly known as: DOSTINEX TAKE 1/2 TABLET TWICE WEEKLY ON [facility] AND Saturday APPOINTMENT NEEDED FOR FURTHER REFILLS   glimepiride 2 MG tablet Commonly known as: AMARYL TAKE 1 TABLET EVERY DAY AT DINNER   irbesartan-hydrochlorothiazide 300-12.5 MG tablet Commonly known as: AVALIDE Take 1 tablet by mouth daily.   Lumigan 0.01 % Soln Generic drug: bimatoprost Place 1 drop into both eyes at bedtime.   metFORMIN 1000 MG tablet Commonly known as: GLUCOPHAGE TAKE 1 TABLET TWICE DAILY WITH A MEAL   olmesartan-hydrochlorothiazide 40-12.5 MG tablet Commonly known as: BENICAR HCT TAKE 1 TABLET EVERY DAY   pioglitazone 30 MG tablet Commonly known as: ACTOS TAKE 1 TABLET EVERY DAY   potassium chloride 10 MEQ tablet Commonly known as: KLOR-CON TAKE 1 TABLET EVERY DAY   pravastatin 40 MG tablet Commonly known as: PRAVACHOL TAKE 1 TABLET EVERY DAY   tadalafil 5 MG tablet Commonly known as: CIALIS TAKE 1 TABLET EVERY DAY AS NEEDED FOR ERECTILE DYSFUNCTION   Trulicity 1.5 JJ/9.4RD Sopn Generic drug: Dulaglutide INJECT 1.5MG (1 PEN) SUBCUTANEOUSLY EVERY WEEK   Xhance 93 MCG/ACT Exhu Generic drug: Fluticasone Propionate Place 2 sprays into the nose 2 (two) times daily.       Allergies:  Allergies  Allergen Reactions  . Atorvastatin Itching  . Dextran Rash    Other Reaction: Not Assessed    Past Medical History:  Diagnosis Date  . Coronary artery disease   . 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 Hyperlipemia   . Hypertension   . Pneumonia 1990s X 1; ~ 2000 X 1  . Type II diabetes mellitus ([place])     Past Surgical History:  Procedure Laterality Date  . CARDIAC CATHETERIZATION  ?2006; 01/25/2018  . ELBOW SURGERY Left    "something to do w/the muscle"  . JOINT REPLACEMENT    . LEFT HEART CATH AND CORONARY ANGIOGRAPHY N/A 01/26/2018   Procedure: LEFT HEART CATH AND CORONARY ANGIOGRAPHY;  [place]:  [place], Peter M, MD;  Location: [place] [facility];  Service: Cardiovascular;  Laterality: N/A;  . PICC LINE INSERTION  ~ 2005   "took it out months later"  . 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 TOTAL KNEE ARTHROPLASTY Left 2005  . TRIGGER FINGER RELEASE Bilateral ~ 2016    Family History  Problem Relation Age of Onset  . Cancer Father   . Diabetes Brother   . Heart attack Brother 62  . Allergic rhinitis Neg Hx   . Angioedema Neg Hx   . Asthma Neg Hx   . Atopy Neg Hx   . Eczema Neg Hx   . Immunodeficiency Neg Hx   . Urticaria Neg Hx     Social History:  reports  that he has never smoked. He has never used smokeless tobacco. He reports that he does not drink alcohol and does not use drugs.  Review of Systems:   Hypertension:   He has been on a regimen of Avapro/HCTZ 300/12.5 and Norvasc 10 mg He has not taken his medications regularly and blood pressure is likely also higher at home   BP Readings from Last 3 Encounters:  08/20/20 (!) 154/82  12/18/19 128/78  08/08/18 140/70     Potassium is normal with taking the 10 mEq supplement  Lab Results  Component Value Date   CREATININE 0.91 08/18/2020   BUN 11 08/18/2020   NA 139 08/18/2020   K 4.3 08/18/2020   CL 100 08/18/2020   CO2 33 (H) 08/18/2020    Lipids: He had been on Crestor and simvastatin and these were stopped because of increased liver functions Because of significantly high lipids on his last visit he was told to start pravastatin He was supposed to continue pravastatin but has not taken this However LDL has been well controlled and below 70 again   Lab Results  Component Value Date   CHOL 160 08/18/2020   CHOL 151 12/17/2019   CHOL 130 03/20/2019   Lab Results  Component Value Date   HDL 65.30 08/18/2020   HDL 56.90 12/17/2019   HDL 59.00 03/20/2019   Lab Results  Component Value Date   LDLCALC 68 08/18/2020   LDLCALC 69 12/17/2019   LDLCALC 55 03/20/2019    Lab Results  Component Value Date   TRIG 135.0 08/18/2020   TRIG 126.0 12/17/2019   TRIG 82.0 03/20/2019   Lab Results  Component Value Date   CHOLHDL 2 08/18/2020   CHOLHDL 3 12/17/2019   CHOLHDL 2 03/20/2019   Lab Results  Component Value Date   LDLDIRECT 120.0 05/09/2018   LDLDIRECT 121.0 11/28/2017   LDLDIRECT 106.0 10/03/2017      Abnormal liver functions:  He has had mild increase in liver functions, possibly from fatty liver although his ultrasound was normal in 2011.  No recent history of excessive alcohol May possibly have had high ALT from statins in the past  ALT has been normal consistently now  Lab Results  Component Value Date   ALT 20 08/18/2020     He has had a 4x 5-mm  prolactinoma since 2007 with baseline prolactin level of 101, treated with Dostinex .  He has been regular with this previously However in the last few weeks he has not taken his cabergoline Prolactin has been normal previously and is still about the same  Testosterone level has been normal with normalization of prolactin  Lab Results  Component Value Date   PROLACTIN 9.0 08/18/2020   PROLACTIN 7.1 03/19/2020   PROLACTIN 7.9 12/17/2019   PROLACTIN 5.4 03/20/2019   PROLACTIN 4.3 11/30/2018      Lab Results  Component Value Date   TESTOSTERONE 477.92 08/18/2020   Erectile dysfunction treated with Cialis     Examination:   BP (!) 154/82   Pulse 72   Ht _0  (1.753 m)   Wt 194 lb 9.6 oz (88.3 kg)   SpO2 98%   BMI 28.74 kg/m   Body mass index is 28.74 kg/m.    ASSESSMENT/ PLAN:   Diabetes type 2 with mild obesity  See history of present illness for detailed discussion of current diabetes management, blood sugar patterns and problems identified  His A1c is fairly stable at 6.2  This  is despite his being very regular with his medication regimen lack of exercise and inconsistent meal planning  His fasting reading is 157 in the lab but likely will improve with  Trulicity  He may also benefit from 3 mg Trulicity Will not restart his Amaryl at this point He needs to start checking his blood sugars again including some readings after meals He says he will be able to start walking now   Hypertension: blood pressure is appearing to be higher from noncompliance with his medications Blood pressure continues to be high will stop potassium and start Aldactone   Hyperprolactinemia: Longstanding, controlled with cabergoline Even with taking half tablet weekly and recently been irregular with his medication his prolactin is still normal He can stop taking this now Testosterone is also still normal   LIPIDS: Still well controlled but he needs to go back to pravastatin for cardiovascular risk reduction  Follow-up in 3 months  There are no Patient Instructions on file for this visit.   Elayne Snare 08/20/2020, 11:31 AM

## 2020-08-20 NOTE — Patient Instructions (Signed)
Stop Cabergoline and Glimeperide  Check blood sugars on waking up 3  days a week  Also check blood sugars about 2 hours after meals and do this after different meals by rotation  Recommended blood sugar levels on waking up are 90-130 and about 2 hours after meal is 130-160  Please bring your blood sugar monitor to each visit, thank you

## 2020-08-24 DIAGNOSIS — R413 Other amnesia: Secondary | ICD-10-CM | POA: Insufficient documentation

## 2020-08-26 DIAGNOSIS — M5416 Radiculopathy, lumbar region: Secondary | ICD-10-CM | POA: Diagnosis not present

## 2020-08-26 DIAGNOSIS — G5621 Lesion of ulnar nerve, right upper limb: Secondary | ICD-10-CM | POA: Diagnosis not present

## 2020-08-26 DIAGNOSIS — G609 Hereditary and idiopathic neuropathy, unspecified: Secondary | ICD-10-CM | POA: Diagnosis not present

## 2020-08-26 DIAGNOSIS — E1165 Type 2 diabetes mellitus with hyperglycemia: Secondary | ICD-10-CM | POA: Diagnosis not present

## 2020-08-26 DIAGNOSIS — R200 Anesthesia of skin: Secondary | ICD-10-CM | POA: Diagnosis not present

## 2020-08-26 DIAGNOSIS — G5603 Carpal tunnel syndrome, bilateral upper limbs: Secondary | ICD-10-CM | POA: Diagnosis not present

## 2020-08-26 DIAGNOSIS — R202 Paresthesia of skin: Secondary | ICD-10-CM | POA: Diagnosis not present

## 2020-08-31 DIAGNOSIS — E119 Type 2 diabetes mellitus without complications: Secondary | ICD-10-CM | POA: Diagnosis not present

## 2020-08-31 DIAGNOSIS — H524 Presbyopia: Secondary | ICD-10-CM | POA: Diagnosis not present

## 2020-08-31 DIAGNOSIS — H35033 Hypertensive retinopathy, bilateral: Secondary | ICD-10-CM | POA: Diagnosis not present

## 2020-08-31 DIAGNOSIS — I10 Essential (primary) hypertension: Secondary | ICD-10-CM | POA: Diagnosis not present

## 2020-09-18 ENCOUNTER — Telehealth: Payer: Self-pay | Admitting: Endocrinology

## 2020-09-18 ENCOUNTER — Other Ambulatory Visit: Payer: Self-pay | Admitting: Endocrinology

## 2020-09-18 DIAGNOSIS — E1165 Type 2 diabetes mellitus with hyperglycemia: Secondary | ICD-10-CM

## 2020-09-18 NOTE — Telephone Encounter (Signed)
Please advise 

## 2020-09-18 NOTE — Telephone Encounter (Signed)
Pt called to request a refill on the below medications. Pt also requests different pharmacy for each medication.    Tadalafil (pt requests it be 10 MG if ok'd by provider)  [id number] [place], [place] - [address] [place] Phone:  [phone]  Fax:  [phone]     &  [facility] [place], [place] Phone:  [phone]  Fax:  [phone]      *both medications are a 30 day supply*

## 2020-09-21 ENCOUNTER — Telehealth: Payer: Self-pay | Admitting: Pharmacy Technician

## 2020-09-21 NOTE — Telephone Encounter (Addendum)
Patient Advocate Encounter   Received notification from [facility] that prior authorization for TRULICITY is required.   PA submitted on 09/23/20 Status is DENIED.  Appeal was also denied.  Medications on formulary that will be approved are: [facility] will continue to follow.   Venida Jarvis. Nadara Mustard, CPhT Patient Advocate [facility] Phone: [phone] Fax:  [phone]

## 2020-09-22 MED ORDER — TRULICITY 1.5 MG/0.5ML ~~LOC~~ SOAJ: SUBCUTANEOUS | 1 refills | Status: DC

## 2020-09-22 MED ORDER — TADALAFIL 5 MG PO TABS: ORAL_TABLET | ORAL | 0 refills | Status: AC

## 2020-09-22 NOTE — Telephone Encounter (Signed)
Rx sent to preferred pharmacies.

## 2020-09-22 NOTE — Addendum Note (Signed)
Addended by: Lauralyn Primes on: 09/22/2020 03:22 PM   Modules accepted: Orders

## 2020-10-07 ENCOUNTER — Other Ambulatory Visit: Payer: Self-pay | Admitting: Endocrinology

## 2020-10-07 MED ORDER — OZEMPIC (0.25 OR 0.5 MG/DOSE) 2 MG/1.5ML ~~LOC~~ SOPN: PEN_INJECTOR | SUBCUTANEOUS | 2 refills | Status: DC

## 2020-10-07 NOTE — Progress Notes (Unsigned)
Trulicity not covered, have switched to Ozempic 0.25 mg weekly, increase To 0.5 mg weekly after 4 injections, He needs to let us know if he needs helpDoing the injection

## 2020-10-15 DIAGNOSIS — B351 Tinea unguium: Secondary | ICD-10-CM | POA: Diagnosis not present

## 2020-10-15 DIAGNOSIS — Z79899 Other long term (current) drug therapy: Secondary | ICD-10-CM | POA: Diagnosis not present

## 2020-10-22 DIAGNOSIS — R351 Nocturia: Secondary | ICD-10-CM | POA: Diagnosis not present

## 2020-10-22 DIAGNOSIS — R3915 Urgency of urination: Secondary | ICD-10-CM | POA: Diagnosis not present

## 2020-10-22 DIAGNOSIS — N5201 Erectile dysfunction due to arterial insufficiency: Secondary | ICD-10-CM | POA: Diagnosis not present

## 2020-10-22 DIAGNOSIS — R3912 Poor urinary stream: Secondary | ICD-10-CM | POA: Diagnosis not present

## 2020-10-22 DIAGNOSIS — N401 Enlarged prostate with lower urinary tract symptoms: Secondary | ICD-10-CM | POA: Diagnosis not present

## 2020-10-22 DIAGNOSIS — Z125 Encounter for screening for malignant neoplasm of prostate: Secondary | ICD-10-CM | POA: Diagnosis not present

## 2020-11-18 ENCOUNTER — Other Ambulatory Visit (INDEPENDENT_AMBULATORY_CARE_PROVIDER_SITE_OTHER): Payer: Medicare HMO

## 2020-11-18 ENCOUNTER — Other Ambulatory Visit: Payer: Self-pay

## 2020-11-18 DIAGNOSIS — E1165 Type 2 diabetes mellitus with hyperglycemia: Secondary | ICD-10-CM | POA: Diagnosis not present

## 2020-11-18 LAB — COMPREHENSIVE METABOLIC PANEL
ALT: 12 U/L (ref 0–53)
AST: 21 U/L (ref 0–37)
Albumin: 4.2 g/dL (ref 3.5–5.2)
Alkaline Phosphatase: 70 U/L (ref 39–117)
BUN: 12 mg/dL (ref 6–23)
CO2: 29 mEq/L (ref 19–32)
Calcium: 9.6 mg/dL (ref 8.4–10.5)
Chloride: 101 mEq/L (ref 96–112)
Creatinine, Ser: 0.92 mg/dL (ref 0.40–1.50)
GFR: 81.92 mL/min (ref >60.00)
Glucose, Bld: 93 mg/dL (ref 70–99)
Potassium: 3.8 mEq/L (ref 3.5–5.1)
Sodium: 139 mEq/L (ref 135–145)
Total Bilirubin: 0.6 mg/dL (ref 0.2–1.2)
Total Protein: 7.8 g/dL (ref 6.0–8.3)

## 2020-11-18 LAB — HEMOGLOBIN A1C: Hgb A1c MFr Bld: 6.2 % (ref 4.6–6.5)

## 2020-11-20 ENCOUNTER — Encounter: Payer: Self-pay | Admitting: Endocrinology

## 2020-11-20 ENCOUNTER — Ambulatory Visit (INDEPENDENT_AMBULATORY_CARE_PROVIDER_SITE_OTHER): Payer: Medicare HMO | Admitting: Endocrinology

## 2020-11-20 ENCOUNTER — Other Ambulatory Visit: Payer: Self-pay

## 2020-11-20 VITALS — BP 158/70 | HR 77 | Ht 70.0 in | Wt 194.0 lb

## 2020-11-20 DIAGNOSIS — D352 Benign neoplasm of pituitary gland: Secondary | ICD-10-CM

## 2020-11-20 DIAGNOSIS — E119 Type 2 diabetes mellitus without complications: Secondary | ICD-10-CM

## 2020-11-20 DIAGNOSIS — I10 Essential (primary) hypertension: Secondary | ICD-10-CM | POA: Diagnosis not present

## 2020-11-20 DIAGNOSIS — E782 Mixed hyperlipidemia: Secondary | ICD-10-CM

## 2020-11-20 DIAGNOSIS — E221 Hyperprolactinemia: Secondary | ICD-10-CM

## 2020-11-20 NOTE — Progress Notes (Signed)
Patient ID: Joshua Vargas, male   DOB: [birth year], 75 y.o.   MRN: [id number]    Reason for Appointment: follow-up of various  problems  History of Present Illness   Type 2 DIABETES MELITUS, date of diagnosis: 2002      Previous history: He was initially treated with metformin alone and then subsequently was also given Actos and Amaryl.  He has had fairly good control but somewhat inconsistent because of difficulties with compliance with diet and exercise periodically. A1c previously has fluctuated between about 6.7-7.6 In 6/15 he was started on Victoza which he wanted to try because of difficulty losing weight and better control  Recent history:   Non-insulin hypoglycemic drugs: Actoplusmet 15/850 once a day, Amaryl 2 mg in evening, Trulicity 1.5 mg weekly           His A1c is the same at 6.2   Current management, blood sugar patterns and problems identified: He has not checked his blood sugars very much and did not bring his meter again He was supposed to switch to Ozempic instead of Trulicity because of insurance denial of Trulicity However even though he was sent a prescription for Ozempic he bought a prescription for Trulicity on his own He does not think his blood sugars are high after meals He is trying to walk up to 60 minutes at least a couple of times a week His lab fasting glucose was 93 Although he says his appetite is generally less and he is mostly eating 1 meal a day his weight is stable He was supposed to stop Amaryl but he still is taking it, likely 2 mg at night  With this he says he occasionally will get low sugars during the night down in the 50s and especially a couple of days after his Trulicity shot No nausea with Trulicity      Side effects from medications:  anorexia with 1.2 mg Victoza       Monitors blood glucose: Sporadically   Glucometer:  One Touch.           Blood Glucose readings at home 70 up to about 142                Dietician  visit: Most recent: 06/2016            Wt Readings from Last 3 Encounters:  11/20/20 194 lb (88 kg)  08/20/20 194 lb 9.6 oz (88.3 kg)  12/18/19 196 lb (88.9 kg)   LABS:  Lab Results  Component Value Date   HGBA1C 6.2 11/18/2020   HGBA1C 6.2 08/18/2020   HGBA1C 5.7 03/19/2020   Lab Results  Component Value Date   MICROALBUR 5.5 (H) 03/19/2020   LDLCALC 68 08/18/2020   CREATININE 0.92 11/18/2020     OTHER problems are discussed in review of systems   LABS:  Lab on 11/18/2020  Component Date Value Ref Range Status   Sodium 11/18/2020 139  135 - 145 mEq/L Final   Potassium 11/18/2020 3.8  3.5 - 5.1 mEq/L Final   Chloride 11/18/2020 101  96 - 112 mEq/L Final   CO2 11/18/2020 29  19 - 32 mEq/L Final   Glucose, Bld 11/18/2020 93  70 - 99 mg/dL Final   BUN 11/18/2020 12  6 - 23 mg/dL Final   Creatinine, Ser 11/18/2020 0.92  0.40 - 1.50 mg/dL Final   Total Bilirubin 11/18/2020 0.6  0.2 - 1.2 mg/dL Final   Alkaline Phosphatase 11/18/2020 70  39 - 117 U/L Final   AST 11/18/2020 21  0 - 37 U/L Final   ALT 11/18/2020 12  0 - 53 U/L Final   Total Protein 11/18/2020 7.8  6.0 - 8.3 g/dL Final   Albumin 11/18/2020 4.2  3.5 - 5.2 g/dL Final   GFR 11/18/2020 81.92  >60.00 mL/min Final   Calculated using the CKD-EPI Creatinine Equation (2021)   Calcium 11/18/2020 9.6  8.4 - 10.5 mg/dL Final   Hgb A1c MFr Bld 11/18/2020 6.2  4.6 - 6.5 % Final   Glycemic Control Guidelines for People with Diabetes:Non Diabetic:  <6%Goal of Therapy: <7%Additional Action Suggested:  >8%     Allergies as of 11/20/2020       Reactions   Atorvastatin Itching   Dextran Rash   Other Reaction: Not Assessed        Medication List        Accurate as of November 20, 2020  3:48 PM. If you have any questions, ask your nurse or doctor.          Accu-Chek Guide test strip Generic drug: glucose blood Use as instructed   Accu-Chek Guide w/Device Kit 1 Device by Does not apply route daily.    acetaminophen 500 MG tablet Commonly known as: TYLENOL Take 500 mg by mouth as needed. pain   amLODipine 10 MG tablet Commonly known as: NORVASC TAKE 1 TABLET EVERY DAY   aspirin 81 MG EC tablet Take 81 mg by mouth daily.   irbesartan-hydrochlorothiazide 300-12.5 MG tablet Commonly known as: AVALIDE Take 1 tablet by mouth daily.   Lumigan 0.01 % Soln Generic drug: bimatoprost Place 1 drop into both eyes at bedtime.   metFORMIN 1000 MG tablet Commonly known as: GLUCOPHAGE TAKE 1 TABLET TWICE DAILY WITH A MEAL   Ozempic (0.25 or 0.5 MG/DOSE) 2 MG/1.5ML Sopn Generic drug: Semaglutide(0.25 or 0.5MG /DOS) Inject 0.25 mg weekly, increase To 0.5 mg weekly after 4 injections   pioglitazone 30 MG tablet Commonly known as: ACTOS TAKE 1 TABLET EVERY DAY   potassium chloride 10 MEQ tablet Commonly known as: KLOR-CON TAKE 1 TABLET EVERY DAY   pravastatin 40 MG tablet Commonly known as: PRAVACHOL TAKE 1 TABLET EVERY DAY   tadalafil 5 MG tablet Commonly known as: CIALIS TAKE 1 TABLET EVERY DAY AS NEEDED FOR ERECTILE DYSFUNCTION   Xhance 93 MCG/ACT Exhu Generic drug: Fluticasone Propionate Place 2 sprays into the nose 2 (two) times daily.        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"   Hyperlipemia    Hypertension    Pneumonia 1990s X 1; ~ 2000 X 1   Type II diabetes mellitus ([place])     Past Surgical History:  Procedure Laterality Date   CARDIAC CATHETERIZATION  ?2006; 01/25/2018   ELBOW SURGERY Left    "something to do w/the muscle"   JOINT REPLACEMENT     LEFT HEART CATH AND CORONARY ANGIOGRAPHY N/A 01/26/2018   Procedure: LEFT HEART CATH AND CORONARY ANGIOGRAPHY;  Gertz: Martinique, Peter M, MD;  Location: [facility];  Service: Cardiovascular;   Laterality: N/A;   PICC LINE INSERTION  ~ 2005   "took it out months later"   RESECTION BONE TUMOR FEMUR Left 1969   "  transplanted bone"   TOTAL KNEE ARTHROPLASTY Left 2005   TRIGGER FINGER RELEASE Bilateral ~ 2016    Family History  Problem Relation Age of Onset   Cancer Father    Diabetes Brother    Heart attack Brother 58   Allergic rhinitis Neg Hx    Angioedema Neg Hx    Asthma Neg Hx    Atopy Neg Hx    Eczema Neg Hx    Immunodeficiency Neg Hx    Urticaria Neg Hx     Social History:  reports that he has never smoked. He has never used smokeless tobacco. He reports that he does not drink alcohol and does not use drugs.  Review of Systems:   Hypertension:   He has been on a regimen of Avapro/HCTZ 300/12.5 and Norvasc 10 mg  BP not checked at home  Again he says his blood pressure is high because of his taking his blood pressure medicine irregularly, none yesterday or today  BP Readings from Last 3 Encounters:  11/20/20 (!) 158/70  08/20/20 (!) 154/82  12/18/19 128/78     Potassium is normal with taking the 10 mEq supplement  Lab Results  Component Value Date   CREATININE 0.92 11/18/2020   BUN 12 11/18/2020   NA 139 11/18/2020   K 3.8 11/18/2020   CL 101 11/18/2020   CO2 29 11/18/2020    Lipids: He had been on Crestor and simvastatin and these were stopped because of increased liver functions Because of significantly high lipids on his last visit he was told to start pravastatin He was supposed to continue pravastatin and was reminded to do this on the last visit LDL has been well controlled and below 70 consistently   Lab Results  Component Value Date   CHOL 160 08/18/2020   CHOL 151 12/17/2019   CHOL 130 03/20/2019   Lab Results  Component Value Date   HDL 65.30 08/18/2020   HDL 56.90 12/17/2019   HDL 59.00 03/20/2019   Lab Results  Component Value Date   LDLCALC 68 08/18/2020   LDLCALC 69 12/17/2019   LDLCALC 55 03/20/2019   Lab  Results  Component Value Date   TRIG 135.0 08/18/2020   TRIG 126.0 12/17/2019   TRIG 82.0 03/20/2019   Lab Results  Component Value Date   CHOLHDL 2 08/18/2020   CHOLHDL 3 12/17/2019   CHOLHDL 2 03/20/2019   Lab Results  Component Value Date   LDLDIRECT 120.0 05/09/2018   LDLDIRECT 121.0 11/28/2017   LDLDIRECT 106.0 10/03/2017      Abnormal liver functions:  He has had mild increase in liver functions, possibly from fatty liver although his ultrasound was normal in 2011.  No recent history of excessive alcohol May possibly have had high ALT from statins in the past  ALT has been normal consistently   Lab Results  Component Value Date   ALT 12 11/18/2020     He has had a 4x 5-mm  prolactinoma since 2007 with baseline prolactin level of 101, treated with Dostinex .  He has been taken off his medication, previously because of his noncompliance with this Last prolactin was normal even without the medication  Testosterone level has been normal with normalization of prolactin  Lab Results  Component Value Date   PROLACTIN 9.0 08/18/2020   PROLACTIN 7.1 03/19/2020   PROLACTIN 7.9 12/17/2019   PROLACTIN 5.4 03/20/2019   PROLACTIN 4.3 11/30/2018      Lab Results  Component Value Date  TESTOSTERONE 477.92 08/18/2020   Erectile dysfunction treated with Cialis     Examination:   BP (!) 158/70   Pulse 77   Ht 5' 10"  (1.778 m)   Wt 194 lb (88 kg)   SpO2 98%   BMI 27.84 kg/m   Body mass index is 27.84 kg/m.    ASSESSMENT/ PLAN:   Diabetes type 2 with mild obesity  See history of present illness for detailed discussion of current diabetes management, blood sugar patterns and problems identified  His A1c is stable at 6.2  Although he is taking his diabetes medications more regularly he is still taking Amaryl which he was supposed to have stopped  Also has not switched from Trulicity to [employer] which is the insurance preference  Still occasionally will  have hypoglycemic symptoms overnight with the Amaryl He has however started doing some walking for exercise  Today was shown how to use the Ozempic pen in the office and he will start taking 0.25 mg weekly for the first 2 weeks and then 0.5 mg weekly if no nausea or excessively decreased appetite Reminded him to check his blood sugars at various times consistently and bring his monitor for download on each visit  Hypertension: blood pressure is again continuing to be higher from noncompliance with his medications He will try to use a pillbox to make sure he takes his medications very regularly  He will also restart monitoring his blood pressure at home and call if consistently high  We will also continue potassium supplements as before since potassium level is normal  Hyperprolactinemia: Longstanding, previously treated with cabergoline Currently not taking cabergoline and will have his labs checked again on the next visit   LIPIDS: To be checked again on the next visit  He does need to talk to his PCP regarding evaluation and management of declining memory function  Follow-up in 3 months  Patient Instructions  Take only 1/2 Glimeperide at [employer] injections by dialing 0.25 mg on the pen as shown once weekly on the same day of the week.   You may inject in the sides of the stomach, outer thigh or arm as indicated in the brochure given. If you have any difficulties using the pen see the video at CompPlans.co.za  You will feel fullness of the stomach with starting the medication and should try to keep the portions at meals small.  You may experience nausea in the first few days which usually gets better over time    After 2 weeks increase the dose to 0.5 mg weekly if no nausea or suppressed appetite  If you have any questions or persistent side effects please call the office   You may also talk to a nurse educator with [employer] at [phone] Useful  website: [email or web address]   Take BP meds every day same time  Talk to PCP about memory    Elayne Snare 11/20/2020, 3:48 PM

## 2020-11-20 NOTE — Patient Instructions (Addendum)
Take only 1/2 Glimeperide at [place] injections by dialing 0.25 mg on the pen as shown once weekly on the same day of the week.   You may inject in the sides of the stomach, outer thigh or arm as indicated in the brochure given. If you have any difficulties using the pen see the video at CompPlans.co.za  You will feel fullness of the stomach with starting the medication and should try to keep the portions at meals small.  You may experience nausea in the first few days which usually gets better over time    After 2 weeks increase the dose to 0.5 mg weekly if no nausea or suppressed appetite  If you have any questions or persistent side effects please call the office   You may also talk to a nurse educator with [employer] at [phone] Useful website: [email or web address]   Take BP meds every day same time  Talk to PCP about memory

## 2020-12-02 ENCOUNTER — Ambulatory Visit (INDEPENDENT_AMBULATORY_CARE_PROVIDER_SITE_OTHER): Payer: Medicare HMO

## 2020-12-02 ENCOUNTER — Encounter (HOSPITAL_COMMUNITY): Payer: Self-pay

## 2020-12-02 ENCOUNTER — Other Ambulatory Visit: Payer: Self-pay

## 2020-12-02 ENCOUNTER — Ambulatory Visit (HOSPITAL_COMMUNITY)
Admission: EM | Admit: 2020-12-02 | Discharge: 2020-12-02 | Disposition: A | Discharge status: Home / Self Care | Payer: Medicare HMO | Attending: Emergency Medicine | Admitting: Emergency Medicine

## 2020-12-02 DIAGNOSIS — S63502A Unspecified sprain of left wrist, initial encounter: Secondary | ICD-10-CM

## 2020-12-02 DIAGNOSIS — M7989 Other specified soft tissue disorders: Secondary | ICD-10-CM | POA: Diagnosis not present

## 2020-12-02 DIAGNOSIS — M25532 Pain in left wrist: Secondary | ICD-10-CM | POA: Diagnosis not present

## 2020-12-02 NOTE — ED Triage Notes (Signed)
Pt reports swelling and pain in the left wrist.

## 2020-12-02 NOTE — ED Provider Notes (Signed)
[place]    CSN: [id number] Arrival date & time: 12/02/20  0857      History   Chief Complaint Chief Complaint  Patient presents with   Wrist Pain    HPI Joshua Vargas is a 75 y.o. male.   Patient here for evaluation of left wrist pain and swelling.  Reports symptoms occurred after using wrist to get up off the floor.  Reports pain is constant and achy but worse with movement.  Has not taken any OTC medications or treatments.  Denies any fevers, chest pain, shortness of breath, N/V/D, numbness, tingling, weakness, abdominal pain, or headaches.    The history is provided by the patient.  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"   Hyperlipemia    Hypertension    Pneumonia 1990s X 1; ~ 2000 X 1   Type II diabetes mellitus ([place])     Patient Active Problem List   Diagnosis Date Noted   Chest pain    Unstable angina ([place]) 01/25/2018   Perennial and seasonal allergic rhinitis 07/31/2015   Nasal polyps 07/31/2015   Prolactinoma ([place]) 12/26/2012   Erectile dysfunction 12/26/2012   Diabetes mellitus ([place]) 12/17/2012   MIXED HYPERLIPIDEMIA 05/29/2008   ESSENTIAL HYPERTENSION, BENIGN 05/29/2008   CORONARY ATHEROSCLEROSIS NATIVE CORONARY ARTERY 05/29/2008    Past Surgical History:  Procedure Laterality Date   CARDIAC CATHETERIZATION  ?2006; 01/25/2018   ELBOW SURGERY Left    "something to do w/the muscle"   JOINT REPLACEMENT     LEFT HEART CATH AND CORONARY ANGIOGRAPHY N/A 01/26/2018   Procedure: LEFT HEART CATH AND CORONARY ANGIOGRAPHY;  [place]: Martinique, Peter M, MD;  Location: [facility];  Service: Cardiovascular;  Laterality: N/A;   PICC LINE INSERTION  ~ 2005   "took it out months later"   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"   TOTAL KNEE ARTHROPLASTY Left 2005   TRIGGER FINGER RELEASE Bilateral ~ 2016       Home  Medications    Prior to Admission medications   Medication Sig Start Date End Date Taking? Authorizing Provider  acetaminophen (TYLENOL) 500 MG tablet Take 500 mg by mouth as needed. pain    [provider]  amLODipine (NORVASC) 10 MG tablet TAKE 1 TABLET EVERY DAY 09/18/20   Elayne Snare, MD  aspirin 81 MG EC tablet Take 81 mg by mouth daily.    [provider]  Blood Glucose Monitoring Suppl (ACCU-CHEK GUIDE) w/Device KIT 1 Device by Does not apply route daily. 12/18/19   Elayne Snare, MD  Fluticasone Propionate Truett Perna) 93 MCG/ACT EXHU Place 2 sprays into the nose 2 (two) times daily. Patient not taking: No sig reported 02/28/19   Kennith Gain, MD  glucose blood (ACCU-CHEK GUIDE) test strip Use as instructed 12/18/19   Elayne Snare, MD  irbesartan-hydrochlorothiazide (AVALIDE) 300-12.5 MG tablet Take 1 tablet by mouth daily. 12/18/19   Elayne Snare, MD  LUMIGAN 0.01 % SOLN Place 1 drop into both eyes at bedtime.  Patient not taking: No sig reported 12/07/16   [provider]  metFORMIN (GLUCOPHAGE) 1000 MG tablet TAKE 1 TABLET TWICE DAILY WITH A MEAL 04/20/20   Elayne Snare, MD  pioglitazone (ACTOS) 30 MG tablet TAKE 1 TABLET EVERY DAY 04/20/20   Elayne Snare, MD  potassium chloride (KLOR-CON) 10 MEQ tablet TAKE 1  TABLET EVERY DAY 04/20/20   Elayne Snare, MD  pravastatin (PRAVACHOL) 40 MG tablet TAKE 1 TABLET EVERY DAY 04/20/20   Elayne Snare, MD  Semaglutide,0.25 or 0.5MG/DOS, (OZEMPIC, 0.25 OR 0.5 MG/DOSE,) 2 MG/1.5ML SOPN Inject 0.25 mg weekly, increase To 0.5 mg weekly after 4 injections 10/07/20   Elayne Snare, MD  tadalafil (CIALIS) 5 MG tablet TAKE 1 TABLET EVERY DAY AS NEEDED FOR ERECTILE DYSFUNCTION 09/22/20   Elayne Snare, MD    Family History Family History  Problem Relation Age of Onset   Cancer Father    Diabetes Brother    Heart attack Brother 29   Allergic rhinitis Neg Hx    Angioedema Neg Hx    Asthma Neg Hx    Atopy Neg Hx    Eczema Neg Hx     Immunodeficiency Neg Hx    Urticaria Neg Hx     Social History Social History   Tobacco Use   Smoking status: Never   Smokeless tobacco: Never  Vaping Use   Vaping Use: Never used  Substance Use Topics   Alcohol use: Never   Drug use: Never     Allergies   Atorvastatin and Dextran   Review of Systems Review of Systems  Musculoskeletal:  Positive for arthralgias and joint swelling.  All other systems reviewed and are negative.   Physical Exam Triage Vital Signs ED Triage Vitals  Enc Vitals Group     BP 12/02/20 0936 (!) 179/74     Pulse Rate 12/02/20 0936 75     Resp 12/02/20 0936 18     Temp 12/02/20 0936 98.3 F (36.8 C)     Temp Source 12/02/20 0936 Oral     SpO2 12/02/20 0936 98 %     Weight --      Height --      Head Circumference --      Peak Flow --      Pain Score 12/02/20 0934 9     Pain Loc --      Pain Edu? --      Excl. in [place]? --    No data found.  Updated Vital Signs BP (!) 179/74 (BP Location: Left Arm)   Pulse 75   Temp 98.3 F (36.8 C) (Oral)   Resp 18   SpO2 98%   Visual Acuity Right Eye Distance:   Left Eye Distance:   Bilateral Distance:    Right Eye Near:   Left Eye Near:    Bilateral Near:     Physical Exam Vitals and nursing note reviewed.  Constitutional:      General: He is not in acute distress.    Appearance: Normal appearance. He is not ill-appearing, toxic-appearing or diaphoretic.  HENT:     Head: Normocephalic and atraumatic.  Eyes:     Conjunctiva/sclera: Conjunctivae normal.  Cardiovascular:     Rate and Rhythm: Normal rate.     Pulses: Normal pulses.  Pulmonary:     Effort: Pulmonary effort is normal.  Abdominal:     General: Abdomen is flat.  Musculoskeletal:     Left wrist: Swelling, tenderness and bony tenderness present. No snuff box tenderness or crepitus. Decreased range of motion. Normal pulse.     Cervical back: Normal range of motion.  Skin:    General: Skin is warm and dry.   Neurological:     General: No focal deficit present.     Mental Status: He is alert and oriented to person, place,  and time.  Psychiatric:        Mood and Affect: Mood normal.     UC Treatments / Results  Labs (all labs ordered are listed, but only abnormal results are displayed) Labs Reviewed - No data to display  EKG   Radiology DG Wrist Complete Left  Result Date: 12/02/2020 CLINICAL DATA:  Swelling, pain and decreased range of motion. Injury 3 days ago. EXAM: LEFT WRIST - COMPLETE 3+ VIEW COMPARISON:  None. FINDINGS: No evidence of fracture, dislocation or degenerative change. The does appear to be some soft tissue swelling in the seen are eminence region and wrist region. IMPRESSION: No bone or joint abnormality. Apparent soft tissue swelling in the thenar eminence region and wrist region. Electronically Signed   By: Nelson Chimes M.D.   On: 12/02/2020 10:16    Procedures Procedures (including critical care time)  Medications Ordered in UC Medications - No data to display  Initial Impression / Assessment and Plan / UC Course  I have reviewed the triage vital signs and the nursing notes.  Pertinent labs & imaging results that were available during my care of the patient were reviewed by me and considered in my medical decision making (see chart for details).    Assessment negative for red flags or concerns.  X-ray with no acute bony abnormality but does show soft tissue swelling in the thenar eminence and wrist regions.  Recommend wrist brace for the next few weeks.  May take Tylenol and/or ibuprofen as needed.  Recommend rest, ice, compression, and elevation.  Follow-up with orthopedics in the next few weeks for reevaluation Final Clinical Impressions(s) / UC Diagnoses   Final diagnoses:  Sprain of left wrist, initial encounter     Discharge Instructions      Wear the wrist brace for the next few weeks.   You can take Tylenol and/or Ibuprofen as needed for pain.    Rest as much as possible Ice for 10-15 minutes every 4-6 hours as needed for pain and swelling Compression- use an ace bandage or splint for comfort Elevate above your hip/heart when sitting and laying down  Follow up with sports medicine or orthopedics or re-evaluation in the next few weeks.      ED Prescriptions   None    PDMP not reviewed this encounter.   Pearson Forster, NP 12/02/20 1031

## 2020-12-02 NOTE — Discharge Instructions (Addendum)
Wear the wrist brace for the next few weeks.   You can take Tylenol and/or Ibuprofen as needed for pain.   Rest as much as possible Ice for 10-15 minutes every 4-6 hours as needed for pain and swelling Compression- use an ace bandage or splint for comfort Elevate above your hip/heart when sitting and laying down  Follow up with sports medicine or orthopedics or re-evaluation in the next few weeks.

## 2021-01-18 DIAGNOSIS — R350 Frequency of micturition: Secondary | ICD-10-CM | POA: Diagnosis not present

## 2021-01-18 DIAGNOSIS — N5201 Erectile dysfunction due to arterial insufficiency: Secondary | ICD-10-CM | POA: Diagnosis not present

## 2021-01-18 DIAGNOSIS — N401 Enlarged prostate with lower urinary tract symptoms: Secondary | ICD-10-CM | POA: Diagnosis not present

## 2021-01-18 DIAGNOSIS — R3915 Urgency of urination: Secondary | ICD-10-CM | POA: Diagnosis not present

## 2021-02-10 ENCOUNTER — Other Ambulatory Visit: Payer: Medicare HMO

## 2021-02-12 ENCOUNTER — Ambulatory Visit: Payer: Medicare HMO | Admitting: Endocrinology

## 2021-03-31 ENCOUNTER — Other Ambulatory Visit: Payer: Medicare HMO

## 2021-04-01 DIAGNOSIS — Z03818 Encounter for observation for suspected exposure to other biological agents ruled out: Secondary | ICD-10-CM | POA: Diagnosis not present

## 2021-04-01 DIAGNOSIS — R5383 Other fatigue: Secondary | ICD-10-CM | POA: Diagnosis not present

## 2021-04-01 DIAGNOSIS — J189 Pneumonia, unspecified organism: Secondary | ICD-10-CM | POA: Diagnosis not present

## 2021-04-01 DIAGNOSIS — R051 Acute cough: Secondary | ICD-10-CM | POA: Diagnosis not present

## 2021-04-05 ENCOUNTER — Emergency Department (HOSPITAL_BASED_OUTPATIENT_CLINIC_OR_DEPARTMENT_OTHER): Payer: Medicare HMO | Admitting: Radiology

## 2021-04-05 ENCOUNTER — Other Ambulatory Visit: Payer: Self-pay

## 2021-04-05 ENCOUNTER — Emergency Department (HOSPITAL_BASED_OUTPATIENT_CLINIC_OR_DEPARTMENT_OTHER)
Admission: EM | Admit: 2021-04-05 | Discharge: 2021-04-05 | Disposition: A | Discharge status: Home / Self Care | Payer: Medicare HMO | Attending: Emergency Medicine | Admitting: Emergency Medicine

## 2021-04-05 ENCOUNTER — Encounter (HOSPITAL_BASED_OUTPATIENT_CLINIC_OR_DEPARTMENT_OTHER): Payer: Self-pay | Admitting: Obstetrics and Gynecology

## 2021-04-05 DIAGNOSIS — R0682 Tachypnea, not elsewhere classified: Secondary | ICD-10-CM | POA: Diagnosis not present

## 2021-04-05 DIAGNOSIS — I2510 Atherosclerotic heart disease of native coronary artery without angina pectoris: Secondary | ICD-10-CM | POA: Diagnosis not present

## 2021-04-05 DIAGNOSIS — Z79899 Other long term (current) drug therapy: Secondary | ICD-10-CM | POA: Insufficient documentation

## 2021-04-05 DIAGNOSIS — Z7982 Long term (current) use of aspirin: Secondary | ICD-10-CM | POA: Diagnosis not present

## 2021-04-05 DIAGNOSIS — J189 Pneumonia, unspecified organism: Secondary | ICD-10-CM | POA: Diagnosis not present

## 2021-04-05 DIAGNOSIS — Z7984 Long term (current) use of oral hypoglycemic drugs: Secondary | ICD-10-CM | POA: Insufficient documentation

## 2021-04-05 DIAGNOSIS — E119 Type 2 diabetes mellitus without complications: Secondary | ICD-10-CM | POA: Diagnosis not present

## 2021-04-05 DIAGNOSIS — R079 Chest pain, unspecified: Secondary | ICD-10-CM | POA: Diagnosis not present

## 2021-04-05 DIAGNOSIS — Z96652 Presence of left artificial knee joint: Secondary | ICD-10-CM | POA: Diagnosis not present

## 2021-04-05 DIAGNOSIS — Z7951 Long term (current) use of inhaled steroids: Secondary | ICD-10-CM | POA: Diagnosis not present

## 2021-04-05 DIAGNOSIS — R062 Wheezing: Secondary | ICD-10-CM

## 2021-04-05 DIAGNOSIS — J181 Lobar pneumonia, unspecified organism: Secondary | ICD-10-CM | POA: Insufficient documentation

## 2021-04-05 DIAGNOSIS — I119 Hypertensive heart disease without heart failure: Secondary | ICD-10-CM | POA: Diagnosis not present

## 2021-04-05 LAB — CBC WITH DIFFERENTIAL/PLATELET
Abs Immature Granulocytes: 0.02 10*3/uL (ref 0.00–0.07)
Basophils Absolute: 0 10*3/uL (ref 0.0–0.1)
Basophils Relative: 1 %
Eosinophils Absolute: 0.3 10*3/uL (ref 0.0–0.5)
Eosinophils Relative: 4 %
HCT: 42.5 % (ref 39.0–52.0)
Hemoglobin: 13.8 g/dL (ref 13.0–17.0)
Immature Granulocytes: 0 %
Lymphocytes Relative: 27 %
Lymphs Abs: 1.7 10*3/uL (ref 0.7–4.0)
MCH: 27.1 pg (ref 26.0–34.0)
MCHC: 32.5 g/dL (ref 30.0–36.0)
MCV: 83.3 fL (ref 80.0–100.0)
Monocytes Absolute: 0.6 10*3/uL (ref 0.1–1.0)
Monocytes Relative: 10 %
Neutro Abs: 3.8 10*3/uL (ref 1.7–7.7)
Neutrophils Relative %: 58 %
Platelets: 217 10*3/uL (ref 150–400)
RBC: 5.1 MIL/uL (ref 4.22–5.81)
RDW: 13.2 % (ref 11.5–15.5)
WBC: 6.5 10*3/uL (ref 4.0–10.5)
nRBC: 0 % (ref 0.0–0.2)

## 2021-04-05 LAB — COMPREHENSIVE METABOLIC PANEL
ALT: 16 U/L (ref 0–44)
AST: 18 U/L (ref 15–41)
Albumin: 3.9 g/dL (ref 3.5–5.0)
Alkaline Phosphatase: 65 U/L (ref 38–126)
Anion gap: 7 (ref 5–15)
BUN: 9 mg/dL (ref 8–23)
CO2: 31 mmol/L (ref 22–32)
Calcium: 9.1 mg/dL (ref 8.9–10.3)
Chloride: 100 mmol/L (ref 98–111)
Creatinine, Ser: 0.91 mg/dL (ref 0.61–1.24)
GFR, Estimated: >60 mL/min (ref >60)
Glucose, Bld: 141 mg/dL — ABNORMAL HIGH (ref 70–99)
Potassium: 3.5 mmol/L (ref 3.5–5.1)
Sodium: 138 mmol/L (ref 135–145)
Total Bilirubin: 0.6 mg/dL (ref 0.3–1.2)
Total Protein: 7.5 g/dL (ref 6.5–8.1)

## 2021-04-05 MED ORDER — ALBUTEROL SULFATE HFA 108 (90 BASE) MCG/ACT IN AERS
1.0000 | INHALATION_SPRAY | Freq: Four times a day (QID) | RESPIRATORY_TRACT | 0 refills | Status: AC | PRN
Start: 2021-04-05 — End: ?

## 2021-04-05 MED ORDER — ALBUTEROL SULFATE HFA 108 (90 BASE) MCG/ACT IN AERS
2.0000 | INHALATION_SPRAY | Freq: Four times a day (QID) | RESPIRATORY_TRACT | Status: DC | PRN
  Administered 2021-04-05: 19:00:00 2 via RESPIRATORY_TRACT
  Filled 2021-04-05 (×2): qty 6.7

## 2021-04-05 MED ORDER — IPRATROPIUM-ALBUTEROL 0.5-2.5 (3) MG/3ML IN SOLN
3.0000 mL | Freq: Once | RESPIRATORY_TRACT | Status: AC
  Administered 2021-04-05: 18:00:00 3 mL via RESPIRATORY_TRACT
  Filled 2021-04-05: qty 3

## 2021-04-05 NOTE — ED Notes (Signed)
auscultated lungs after breathing treatment and lungs clear. S 1 S2 heard.

## 2021-04-05 NOTE — ED Triage Notes (Signed)
Patient reports to the ER for pneumonia. Patient reports he was diagnosed on Thursday. Patient reports he got the antibiotics on Saturday but that he feels he is getting worse. Patient reports he is having trouble catching his breath when he walks

## 2021-04-05 NOTE — ED Notes (Signed)
Unnknown to this RN Albuterol inhaler had already been retrieved from pyxis  by RT. The ons I had taken out was opened before going into room. Due to the fact that the inhaler was open and already in the pt's room. Inhaler was disposed of as dirty so the Pt would not be charge twice.

## 2021-04-05 NOTE — Discharge Instructions (Addendum)
You were found to have pneumonia in your right lung, possibly in other parts of your lung as well.  You are already on antibiotics from your primary care doctor for this, and I recommend you continue your treatment as they recommend.  However, you did have quite a bit of wheezing on my exam today.  This resolved with a breathing treatment here in the ED.  We will be sending you home with an inhaler to use at home in case your wheezing returns.  Please bring this up to your primary care doctor so they can continue to monitor this as well.  Please return if you develop fevers, shortness of breath, worsening ability to breathe.

## 2021-04-06 ENCOUNTER — Ambulatory Visit (INDEPENDENT_AMBULATORY_CARE_PROVIDER_SITE_OTHER): Payer: Medicare HMO | Admitting: Podiatry

## 2021-04-06 ENCOUNTER — Encounter: Payer: Self-pay | Admitting: Podiatry

## 2021-04-06 DIAGNOSIS — M79674 Pain in right toe(s): Secondary | ICD-10-CM | POA: Diagnosis not present

## 2021-04-06 DIAGNOSIS — M79675 Pain in left toe(s): Secondary | ICD-10-CM

## 2021-04-06 DIAGNOSIS — E1140 Type 2 diabetes mellitus with diabetic neuropathy, unspecified: Secondary | ICD-10-CM | POA: Diagnosis not present

## 2021-04-06 DIAGNOSIS — B351 Tinea unguium: Secondary | ICD-10-CM

## 2021-04-06 NOTE — Progress Notes (Signed)
°  Subjective:  Patient ID: Joshua Vargas, male    DOB: 15-Nov-1945,   MRN: [id number]  Chief Complaint  Patient presents with   Nail Problem    diabetic foot exam, thick toenails    75 y.o. male presents for diabetic foot exam and concern for painful thick elongated toenails he is unable to trim himself. Requests nail trim today.  Relates occasional burning and tingling. Not currently taking any medication. He is diabetic and his last A1c was 6.2.  Denies any other pedal complaints. Denies n/v/f/c.   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 CFT <3 seconds. Normal hair growth on digits. No edema.  Skin. No lacerations or abrasions bilateral feet. Nails 1-5 are thickened discolored and elongated with subungual debris.  Musculoskeletal: MMT 5/5 bilateral lower extremities in DF, PF, Inversion and Eversion. Deceased ROM in DF of ankle joint.  Neurological: Sensation intact to light touch. Protective sensation intact.   Assessment:   1. Type 2 diabetes mellitus with diabetic neuropathy, unspecified whether long term insulin use (HCC)   2. Pain due to onychomycosis of toenails of both feet      Plan:  Patient was evaluated and treated and all questions answered. -Discussed and educated patient on diabetic foot care, especially with regards to the vascular, neurological and musculoskeletal systems.  -Stressed the importance of good glycemic control and the detriment of not  controlling glucose levels in relation to the foot. -Discussed supportive shoes at all times and checking feet regularly.  -Mechanically debrided all nails 1-5 bilateral using sterile nail nipper and filed with dremel without incident  -Answered all patient  questions -Patient to return  in 3 months for at risk foot care -Patient advised to call the office if any problems or questions arise in the meantime.   Lorenda Peck, DPM

## 2021-04-07 ENCOUNTER — Ambulatory Visit: Payer: Medicare HMO | Admitting: Endocrinology

## 2021-04-07 NOTE — ED Provider Notes (Signed)
[facility] Provider Note   CSN: [id number] Arrival date & time: 04/05/21  1503     History Chief Complaint  Patient presents with   Pneumonia    Laura Radilla Scales is a 75 y.o. male with history of CAD, hyperlipidemia, hypertension, type 2 diabetes was recently diagnosed with pneumonia 4 days prior.  He was given 2 antibiotics to help clear his infection.  However states patient feels that he is getting worse.  He complains of wheezing worse at night.  He is having trouble sleeping and breathing due to his symptoms.  He is having mild chest pain secondary to ongoing trouble breathing.  He is not having any fevers.  He has been taking over-the-counter cold and flu medications in addition to his antibiotics without relief.  No alleviating factors.  He denies chills, abdominal pain, numbness, tingling, palpitations, N/V/D, back pain and headache.   Pneumonia Associated symptoms include shortness of breath. Pertinent negatives include no abdominal pain and no headaches.      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   Erectile dysfunction 12/26/2012   Diabetes mellitus ([place]) 12/17/2012   MIXED HYPERLIPIDEMIA 05/29/2008   ESSENTIAL HYPERTENSION, BENIGN 05/29/2008   CORONARY ATHEROSCLEROSIS NATIVE CORONARY ARTERY 05/29/2008    Past Surgical History:  Procedure Laterality Date   CARDIAC CATHETERIZATION  ?2006; 01/25/2018   ELBOW SURGERY Left    "something to do w/the muscle"   JOINT REPLACEMENT     LEFT HEART CATH AND CORONARY ANGIOGRAPHY N/A 01/26/2018    Procedure: LEFT HEART CATH AND CORONARY ANGIOGRAPHY;  [place]: Martin[place], Peter M, MD;  Location: [facility];  Service: Cardiovascular;  Laterality: N/A;   PICC LINE INSERTION  ~ 2005   "took it out months later"   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"   TOTAL KNEE ARTHROPLASTY Left 2005   TRIGGER FINGER RELEASE Bilateral ~ 2016       Family History  Problem Relation Age of Onset   Cancer Father    Diabetes Brother    Heart attack Brother 84   Allergic rhinitis Neg Hx    Angioedema Neg Hx    Asthma Neg Hx    Atopy Neg Hx    Eczema Neg Hx    Immunodeficiency Neg Hx    Urticaria Neg Hx     Social History   Tobacco Use   Smoking status: Never   Smokeless tobacco: Never  Vaping Use   Vaping Use: Never used  Substance Use Topics   Alcohol use: Never   Drug use: Never    Home Medications Prior to Admission medications   Medication Sig Start Date End Date Taking? Authorizing Provider  albuterol (VENTOLIN HFA) 108 (90 Base) MCG/ACT inhaler Inhale 1-2 puffs into the lungs every 6 (six) hours as needed for wheezing or shortness of breath. 04/05/21  Yes Kathe Becton R, PA-C  acetaminophen (TYLENOL) 500 MG tablet Take 500 mg by mouth as needed. pain  [provider]  amLODipine (NORVASC) 10 MG tablet TAKE 1 TABLET EVERY DAY 09/18/20   Elayne Snare, MD  aspirin 81 MG EC tablet Take 81 mg by mouth daily.    [provider]  Blood Glucose Monitoring Suppl (ACCU-CHEK GUIDE) w/Device KIT 1 Device by Does not apply route daily. 12/18/19   Elayne Snare, MD  Fluticasone Propionate Truett Perna) 93 MCG/ACT EXHU Place 2 sprays into the nose 2 (two) times daily. Patient not taking: No sig reported 02/28/19   Kennith Gain, MD  glucose blood (ACCU-CHEK GUIDE) test strip Use as instructed 12/18/19   Elayne Snare, MD  irbesartan-hydrochlorothiazide (AVALIDE) 300-12.5 MG tablet Take 1 tablet by mouth daily. 12/18/19   Elayne Snare, MD  LUMIGAN 0.01  % SOLN Place 1 drop into both eyes at bedtime.  Patient not taking: No sig reported 12/07/16   [provider]  metFORMIN (GLUCOPHAGE) 1000 MG tablet TAKE 1 TABLET TWICE DAILY WITH A MEAL 04/20/20   Elayne Snare, MD  pioglitazone (ACTOS) 30 MG tablet TAKE 1 TABLET EVERY DAY 04/20/20   Elayne Snare, MD  potassium chloride (KLOR-CON) 10 MEQ tablet TAKE 1 TABLET EVERY DAY 04/20/20   Elayne Snare, MD  pravastatin (PRAVACHOL) 40 MG tablet TAKE 1 TABLET EVERY DAY 04/20/20   Elayne Snare, MD  Semaglutide,0.25 or 0.5MG/DOS, (OZEMPIC, 0.25 OR 0.5 MG/DOSE,) 2 MG/1.5ML SOPN Inject 0.25 mg weekly, increase To 0.5 mg weekly after 4 injections 10/07/20   Elayne Snare, MD  tadalafil (CIALIS) 5 MG tablet TAKE 1 TABLET EVERY DAY AS NEEDED FOR ERECTILE DYSFUNCTION 09/22/20   Elayne Snare, MD    Allergies    Atorvastatin and Dextran  Review of Systems   Review of Systems  Constitutional:  Negative for fever.  HENT: Negative.    Eyes: Negative.   Respiratory:  Positive for chest tightness, shortness of breath and wheezing.   Cardiovascular: Negative.   Gastrointestinal:  Negative for abdominal pain, diarrhea and vomiting.  Endocrine: Negative.   Genitourinary: Negative.   Musculoskeletal: Negative.   Skin:  Negative for rash.  Neurological:  Negative for headaches.  All other systems reviewed and are negative.  Physical Exam Updated Vital Signs BP (!) 194/77 (BP Location: Right Arm)    Pulse 85    Temp 98 F (36.7 C)    Resp 16    Ht 5' 10" (1.778 m)    Wt 86 kg    SpO2 100%    BMI 27.20 kg/m   Physical Exam Constitutional:      Appearance: Normal appearance.  HENT:     Head: Atraumatic.     Right Ear: Tympanic membrane normal.     Left Ear: Tympanic membrane normal.     Nose: Nose normal.     Mouth/Throat:     Mouth: Mucous membranes are moist.  Eyes:     Conjunctiva/sclera: Conjunctivae normal.     Pupils: Pupils are equal, round, and reactive to light.  Cardiovascular:     Rate and Rhythm:  Normal rate and regular rhythm.  Pulmonary:     Effort: Pulmonary effort is normal. Tachypnea present.     Comments: Breathing effort normal with no accessory muscle usage.  There is diffuse wheezing in all lung fields. Abdominal:     General: Abdomen is flat.     Tenderness: There is no abdominal tenderness.  Musculoskeletal:        General: Normal range of motion.  Skin:    General: Skin is warm.  Capillary Refill: Capillary refill takes less than 2 seconds.  Neurological:     General: No focal deficit present.     Mental Status: He is alert.  Psychiatric:        Mood and Affect: Mood normal.    ED Results / Procedures / Treatments   Labs (all labs ordered are listed, but only abnormal results are displayed) Labs Reviewed  COMPREHENSIVE METABOLIC PANEL - Abnormal; Notable for the following components:      Result Value   Glucose, Bld 141 (*)    All other components within normal limits  CBC WITH DIFFERENTIAL/PLATELET    EKG None  Radiology No results found.  Procedures Procedures   Medications Ordered in ED Medications  ipratropium-albuterol (DUONEB) 0.5-2.5 (3) MG/3ML nebulizer solution 3 mL (3 mLs Nebulization Given 04/05/21 1738)    ED Course  I have reviewed the triage vital signs and the nursing notes.  Pertinent labs & imaging results that were available during my care of the patient were reviewed by me and considered in my medical decision making (see chart for details).    MDM Rules/Calculators/A&P                         This is a 75 year old patient with diabetes and no prior history of COPD or asthma who presents to the ED for evaluation for worsening shortness of breath in the setting of pneumonia.  This complaint carries with it a high risk of mortality and this increases the complexity of the case. The emergent differential diagnosis for shortness of breath includes, but is not limited to, Pulmonary edema, bronchoconstriction, Pneumonia,  Pulmonary embolism, Pneumotherax/ Hemothorax, Dysrythmia, ACS.     Labs ordered in ED: CMP CBC unremarkable.  I independently reviewed and interpreted all labs myself.  Imaging ordered in ED: Given patient's history and symptoms I ordered a chest x-ray.chest x-ray with infiltrate in the right lower lobe consistent with previous imaging.  There is may be slight worsening of patchy infiltrates elsewhere throughout the lung.  Small amount of pleural fluid. I independently reviewed and interpreted all imaging myself and agree with radiologist interpretation.  Medication: Given patient's significant wheezing on lung exam I gave him a DuoNeb treatment while here in ED.  On reassessment, he was feeling much better and there was no audible wheezing on auscultation.  Condition was much improved.  Disposition: Given patient's work-up, exam and history, patient is safe for discharge with close outpatient follow-up.  Wheezing in setting of pneumonia-I recommend that he continue antibiotics prescribed by his primary doctor and have them recheck an x-ray when he finishes his treatment.  I prescribed patient an inhaler that he can use as needed for wheezing.  We discussed symptoms that would warrant emergent return to the ED.  All questions were asked and answered.  Patient is understanding of diagnosis and amenable to plan.  Discharged home in good condition.   Final Clinical Impression(s) / ED Diagnoses Final diagnoses:  Community acquired pneumonia of right lung, unspecified part of lung  Wheezing on auscultation    Rx / DC Orders ED Discharge Orders          Ordered    albuterol (VENTOLIN HFA) 108 (90 Base) MCG/ACT inhaler  Every 6 hours PRN        04/05/21 1830             Rodena Piety 04/07/21 2024    Lajean Saver, MD  04/09/21 1857

## 2021-04-08 DIAGNOSIS — J189 Pneumonia, unspecified organism: Secondary | ICD-10-CM | POA: Diagnosis not present

## 2021-04-08 DIAGNOSIS — I10 Essential (primary) hypertension: Secondary | ICD-10-CM | POA: Diagnosis not present

## 2021-05-06 ENCOUNTER — Other Ambulatory Visit: Payer: Self-pay | Admitting: Family Medicine

## 2021-05-06 ENCOUNTER — Ambulatory Visit
Admission: RE | Admit: 2021-05-06 | Discharge: 2021-05-06 | Disposition: A | Discharge status: Home / Self Care | Payer: Medicare HMO | Source: Ambulatory Visit | Attending: Family Medicine | Admitting: Family Medicine

## 2021-05-06 DIAGNOSIS — R413 Other amnesia: Secondary | ICD-10-CM | POA: Diagnosis not present

## 2021-05-06 DIAGNOSIS — R0683 Snoring: Secondary | ICD-10-CM | POA: Diagnosis not present

## 2021-05-06 DIAGNOSIS — R0602 Shortness of breath: Secondary | ICD-10-CM | POA: Diagnosis not present

## 2021-05-06 DIAGNOSIS — H9190 Unspecified hearing loss, unspecified ear: Secondary | ICD-10-CM | POA: Diagnosis not present

## 2021-05-06 DIAGNOSIS — I10 Essential (primary) hypertension: Secondary | ICD-10-CM | POA: Diagnosis not present

## 2021-05-06 DIAGNOSIS — L989 Disorder of the skin and subcutaneous tissue, unspecified: Secondary | ICD-10-CM | POA: Diagnosis not present

## 2021-05-07 ENCOUNTER — Other Ambulatory Visit: Payer: Self-pay | Admitting: Endocrinology

## 2021-05-10 ENCOUNTER — Other Ambulatory Visit: Payer: Self-pay

## 2021-05-10 DIAGNOSIS — E119 Type 2 diabetes mellitus without complications: Secondary | ICD-10-CM

## 2021-05-10 MED ORDER — IRBESARTAN-HYDROCHLOROTHIAZIDE 300-12.5 MG PO TABS: 1.0000 | ORAL_TABLET | Freq: Every day | ORAL | 0 refills | Status: DC

## 2021-05-17 ENCOUNTER — Other Ambulatory Visit: Payer: Medicare HMO

## 2021-05-25 ENCOUNTER — Telehealth: Payer: Self-pay | Admitting: *Deleted

## 2021-05-25 ENCOUNTER — Other Ambulatory Visit: Payer: Self-pay

## 2021-05-25 ENCOUNTER — Encounter: Payer: Self-pay | Admitting: Cardiology

## 2021-05-25 ENCOUNTER — Ambulatory Visit: Payer: Medicare HMO | Admitting: Cardiology

## 2021-05-25 ENCOUNTER — Telehealth (HOSPITAL_COMMUNITY): Payer: Self-pay

## 2021-05-25 VITALS — BP 144/68 | HR 88 | Ht 70.0 in | Wt 195.0 lb

## 2021-05-25 DIAGNOSIS — I10 Essential (primary) hypertension: Secondary | ICD-10-CM

## 2021-05-25 DIAGNOSIS — I2510 Atherosclerotic heart disease of native coronary artery without angina pectoris: Secondary | ICD-10-CM | POA: Diagnosis not present

## 2021-05-25 DIAGNOSIS — E7800 Pure hypercholesterolemia, unspecified: Secondary | ICD-10-CM | POA: Diagnosis not present

## 2021-05-25 DIAGNOSIS — R0683 Snoring: Secondary | ICD-10-CM

## 2021-05-25 MED ORDER — IRBESARTAN 300 MG PO TABS: 300.0000 mg | ORAL_TABLET | Freq: Every day | ORAL | 3 refills | Status: DC

## 2021-05-25 MED ORDER — CHLORTHALIDONE 25 MG PO TABS: 25.0000 mg | ORAL_TABLET | Freq: Every day | ORAL | 3 refills | Status: DC

## 2021-05-25 NOTE — Telephone Encounter (Signed)
Pt had appt today with Dr. Radford Pax who ordered an [facility] Sleep study. Pt has uploaded application onto his phone while in the office. I have gone through the instructional tutorial with the pt with verbal understanding from the pt. Pt is aware to not open the device box until we have called him back with a PIN# and once we get the ok to proceed from the insurance company.pt thanked me for the help today.

## 2021-05-25 NOTE — Telephone Encounter (Signed)
Detailed instructions left on the patient's answering machine. Asked to call back with any questions. S.Drewey Begue EMTP 

## 2021-05-25 NOTE — Addendum Note (Signed)
Addended by: Sueanne Margarita on: 05/25/2021 08:41 AM   Modules accepted: Orders

## 2021-05-25 NOTE — Patient Instructions (Signed)
Medication Instructions:  Your physician has recommended you make the following change in your medication:  1) STOP taking irbesartan-HCTZ 2) START taking irbesartan 30 mg daily  3) START taking chlorthalidone 25 mg daily   *If you need a refill on your cardiac medications before your next appointment, please call your pharmacy*  Lab Work: IN ONE WEEK: BMET  If you have labs (blood work) drawn today and your tests are completely normal, you will receive your results only by: [place] (if you have MyChart) OR A paper copy in the mail If you have any lab test that is abnormal or we need to change your treatment, we will call you to review the results.  Testing/Procedures: Your physician has requested that you have a lexiscan myoview. For further information please visit [email or web address]. Please follow instruction sheet, as given.  Your physician has recommended that you have a sleep study. This test records several body functions during sleep, including: brain activity, eye movement, oxygen and carbon dioxide blood levels, heart rate and rhythm, breathing rate and rhythm, the flow of air through your mouth and nose, snoring, body muscle movements, and chest and belly movement.  Follow-Up: At [facility], you and your health needs are our priority.  As part of our continuing mission to provide you with exceptional heart care, we have created designated Provider Care Teams.  These Care Teams include your primary Cardiologist (physician) and Advanced Practice Providers (APPs -  Physician Assistants and Nurse Practitioners) who all work together to provide you with the care you need, when you need it.  Your next appointment:   6 month(s)  The format for your next appointment:   In Person  Provider:   Fransico Him, MD    You have been referred to see our PharmD in the Hypertension Clinic in 2-3 weeks.

## 2021-05-25 NOTE — Progress Notes (Signed)
Cardiology CONSULT Note    Date:  05/25/2021   ID:  Joshua Vargas, DOB February 10, 1946, MRN [id number]  PCP:  Antony Contras, MD  Cardiologist:  Fransico Him, MD   Chief Complaint  Patient presents with   Coronary Artery Disease   Hypertension   Hyperlipidemia    History of Present Illness:  Joshua Vargas is a 76 y.o. male who is being seen today for the evaluation of snoring and hypertension at the request of Antony Contras, MD.  This is a 76 year old African-American male with a history of CAD, hyperlipidemia, hypertension and diabetes mellitus who has been having issues with blood pressure control and also mentioned to his primary that he was having problems with snoring.  He also has not followed up with a cardiologist since his last visit in 2019 at which time he was hospitalized with chest pain underwent cardiac cath showing a 70% D1, 25% proximal LAD and normal LV function.  He apparently has had a lot of issues recently with poorly controlled blood pressure.  This is in the setting of intermittent noncompliance with taking blood pressure medications.  Recently though his blood pressures have been as high as 210/88 mmHg.  His wife was concerned that he may have obstructive sleep apnea as he has had problems with excessive daytime sleepiness and snores a lot at night and appears to be short of breath at night when he is laying down.  He says that he will have to take naps during the day.  He says that occasionally he will get some pain in his chest that is nonexertional and only lasts a few minutes and resolves.  There is no associated symptoms of nausea, diaphoresis or radiation of the pain.  He had SOB with climbing stairs when he was not taking his BP meds but when on them he has not SOB.  He denies any PND, orthopnea, palpitations or syncope. He occasionally has some mild LE edema.  He is compliant with his meds and is tolerating meds with no SE.     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"   History of colonic polyps    Hyperlipemia    Hypertension    Knee pain    Memory loss    Paresthesia    Pneumonia 1990s X 1; ~ 2000 X 1   Prolactinoma ([place])    Type II diabetes mellitus ([place])    Vitreous floaters of right eye     Past Surgical History:  Procedure Laterality Date   CARDIAC CATHETERIZATION  ?2006; 01/25/2018   ELBOW SURGERY Left    "something to do w/the muscle"   JOINT REPLACEMENT     LEFT HEART CATH AND CORONARY ANGIOGRAPHY N/A 01/26/2018   Procedure: LEFT HEART CATH AND CORONARY ANGIOGRAPHY;  Wiesen: Martinique, Peter M, MD;  Location: [facility];  Service: Cardiovascular;  Laterality: N/A;   PICC LINE INSERTION  ~ 2005   "took it out months later"   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"   TOTAL KNEE ARTHROPLASTY Left 2005   TRIGGER FINGER RELEASE Bilateral ~ 2016    Current Medications: Current Meds  Medication Sig  acetaminophen (TYLENOL) 500 MG tablet Take 500 mg by mouth as needed. pain   albuterol (VENTOLIN HFA) 108 (90 Base) MCG/ACT inhaler Inhale 1-2 puffs into the lungs every 6 (six) hours as needed for wheezing or shortness of breath.   amLODipine (NORVASC) 10 MG tablet TAKE 1 TABLET EVERY DAY   aspirin 81 MG EC tablet Take 81 mg by mouth daily.   Blood Glucose Monitoring Suppl (ACCU-CHEK GUIDE) w/Device KIT 1 Device by Does not apply route daily.   Fluticasone Propionate (XHANCE) 93 MCG/ACT EXHU Place 2 sprays into the nose 2 (two) times daily.   glimepiride (AMARYL) 2 MG tablet TAKE 1/2 TABLET EVERY DAY AT DINNER   glucose blood (ACCU-CHEK GUIDE) test strip Use as instructed   irbesartan-hydrochlorothiazide (AVALIDE) 300-12.5 MG tablet Take 1  tablet by mouth daily.   LUMIGAN 0.01 % SOLN Place 1 drop into both eyes at bedtime.   metFORMIN (GLUCOPHAGE) 1000 MG tablet TAKE 1 TABLET TWICE DAILY WITH A MEAL   pioglitazone (ACTOS) 30 MG tablet TAKE 1 TABLET EVERY DAY   potassium chloride (KLOR-CON M) 10 MEQ tablet TAKE 1 TABLET EVERY DAY   pravastatin (PRAVACHOL) 40 MG tablet TAKE 1 TABLET EVERY DAY   Semaglutide,0.25 or 0.5MG/DOS, (OZEMPIC, 0.25 OR 0.5 MG/DOSE,) 2 MG/1.5ML SOPN Inject 0.25 mg weekly, increase To 0.5 mg weekly after 4 injections   tadalafil (CIALIS) 5 MG tablet TAKE 1 TABLET EVERY DAY AS NEEDED FOR ERECTILE DYSFUNCTION    Allergies:   Atorvastatin and Dextran   Social History   Socioeconomic History   Marital status: Married    Spouse name: Not on file   Number of children: Not on file   Years of education: Not on file   Highest education level: Not on file  Occupational History   Not on file  Tobacco Use   Smoking status: Never   Smokeless tobacco: Never  Vaping Use   Vaping Use: Never used  Substance and Sexual Activity   Alcohol use: Never   Drug use: Never   Sexual activity: Yes  Other Topics Concern   Not on file  Social History Narrative   Not on file   Social Determinants of Health   Financial Resource Strain: Not on file  Food Insecurity: Not on file  Transportation Needs: Not on file  Physical Activity: Not on file  Stress: Not on file  Social Connections: Not on file     Family History:  The patient's family history includes Cancer in his father; Diabetes in his brother; Heart attack (age of onset: 50) in his brother.   ROS:   Please see the history of present illness.    ROS All other systems reviewed and are negative.  No flowsheet data found.     PHYSICAL EXAM:   VS:  BP (!) 144/68    Pulse 88    Ht 5' 10"  (1.778 m)    Wt 195 lb (88.5 kg)    SpO2 99%    BMI 27.98 kg/m    GEN: Well nourished, well developed, in no acute distress  HEENT: normal  Neck: no JVD, carotid  bruits, or masses Cardiac: RRR; no murmurs, rubs, or gallops,no edema.  Intact distal pulses bilaterally.  Respiratory:  clear to auscultation bilaterally, normal work of breathing GI: soft, nontender, nondistended, + BS MS: no deformity or atrophy  Skin: warm and dry, no rash Neuro:  Alert and Oriented x 3, Strength and sensation are intact Psych: euthymic mood, full affect  Wt Readings from Last 3 Encounters:  05/25/21 195 lb (88.5 kg)  04/05/21 189 lb 9.5 oz (86 kg)  11/20/20 194 lb (88 kg)      Studies/Labs Reviewed:   EKG:  EKG is ordered today.  The ekg ordered today demonstrates NSR with nonspecific T wave abnormality  Recent Labs: 04/05/2021: ALT 16; BUN 9; Creatinine, Ser 0.91; Hemoglobin 13.8; Platelets 217; Potassium 3.5; Sodium 138   Lipid Panel    Component Value Date/Time   CHOL 160 08/18/2020 0829   TRIG 135.0 08/18/2020 0829   HDL 65.30 08/18/2020 0829   CHOLHDL 2 08/18/2020 0829   VLDL 27.0 08/18/2020 0829   LDLCALC 68 08/18/2020 0829   LDLDIRECT 120.0 05/09/2018 0815    Additional studies/ records that were reviewed today include:      ASSESSMENT:    1. Benign essential HTN   2. Coronary artery disease involving native coronary artery of native heart without angina pectoris   3. Pure hypercholesterolemia   4. Snoring      PLAN:  In order of problems listed above:  Hypertension -BP is adequately controlled on exam. -Continue prescription drug management with amlodipine 10 mg daily with as needed refills -stop Irbesartan HC and start Irbesartan 387m daily and add chlorthalidone 273mdaily for better BP control -check BMET in 1 week and followup in PharmD HTN clinic in 2 weeks and if BP is still elevated will add carvedilol -I have personally reviewed and interpreted outside labs performed by patient's PCP which showed serum creatinine 0.91 and potassium 3.5 on 04/05/2021 -I have stressed the importance of being compliant with his meds  2.   ASCAD -Cath on 01/26/2018 showed 70% D1 and 25% proximal LAD with normal LV function -He has had a few episodes of chest pain although they sound atypical, I will get a lexiscan myoview to rule out ischemia - Shared Decision Making/Informed Consent The risks [chest pain, shortness of breath, cardiac arrhythmias, dizziness, blood pressure fluctuations, myocardial infarction, stroke/transient ischemic attack, nausea, vomiting, allergic reaction, radiation exposure, metallic taste sensation and life-threatening complications (estimated to be 1 in 10,000)], benefits (risk stratification, diagnosing coronary artery disease, treatment guidance) and alternatives of a nuclear stress test were discussed in detail with Mr. SuPsychologist, sport and exercisend he agrees to proceed. -Continue prescription drug therapy with aspirin 81 mg daily, pravastatin 40 mg daily, amlodipine 10 mg daily with as needed refills  3.  HLD -LDL goal is less than 70 -I have personally reviewed and interpreted outside labs performed by patient's PCP which showed LDL 68, HDL 65, triglycerides 135 on 08/18/2020 -Continue pravastatin 40 mg daily with as needed refills  4.  Snoring -Given his history of hypertension, diabetes and CAD and history of snoring I think it is prudent to get a home sleep study -I will order an [place] home sleep study to rule out sleep apnea  Time Spent: 20 minutes total time of encounter, including 15 minutes spent in face-to-face patient care on the date of this encounter. This time includes coordination of care and counseling regarding above mentioned problem list. Remainder of non-face-to-face time involved reviewing chart documents/testing relevant to the patient encounter and documentation in the medical record. I have independently reviewed documentation from referring provider  Medication Adjustments/Labs and Tests Ordered: Current medicines are reviewed at length with the patient today.  Concerns regarding medicines are  outlined above.  Medication changes, Labs and Tests ordered today are listed in the Patient Instructions below.  There are no Patient  Instructions on file for this visit.   Signed, Fransico Him, MD  05/25/2021 8:16 AM    [facility] [address], [place], [place]  [address] Phone: [phone]; Fax: [phone]

## 2021-05-25 NOTE — Addendum Note (Signed)
Addended by: Antonieta Iba on: 05/25/2021 08:35 AM   Modules accepted: Orders

## 2021-05-26 ENCOUNTER — Ambulatory Visit: Payer: Medicare HMO | Admitting: Endocrinology

## 2021-05-26 ENCOUNTER — Other Ambulatory Visit: Payer: Medicare HMO

## 2021-05-28 NOTE — Telephone Encounter (Signed)
I s/w the pt this morning and he has been given the ok to proceed with sleep study. Pt was given Pin # [phone]. Pt thanked me for the call and the help. Pt states he will do sleep this weekend.   Called and made the patient aware that he  may proceed with the [facility] Sleep Study. PIN # provided to the patient. Patient made aware that he will be contacted after the test has been read with the results and any recommendations. Patient verbalized understanding and thanked me for the call.

## 2021-05-30 ENCOUNTER — Encounter (INDEPENDENT_AMBULATORY_CARE_PROVIDER_SITE_OTHER): Payer: Medicare HMO | Admitting: Cardiology

## 2021-05-30 DIAGNOSIS — G4733 Obstructive sleep apnea (adult) (pediatric): Secondary | ICD-10-CM

## 2021-05-31 ENCOUNTER — Telehealth: Payer: Self-pay | Admitting: Cardiology

## 2021-05-31 ENCOUNTER — Ambulatory Visit: Payer: Medicare HMO

## 2021-05-31 DIAGNOSIS — I10 Essential (primary) hypertension: Secondary | ICD-10-CM | POA: Diagnosis not present

## 2021-05-31 DIAGNOSIS — Z0000 Encounter for general adult medical examination without abnormal findings: Secondary | ICD-10-CM | POA: Diagnosis not present

## 2021-05-31 DIAGNOSIS — I2510 Atherosclerotic heart disease of native coronary artery without angina pectoris: Secondary | ICD-10-CM

## 2021-05-31 DIAGNOSIS — Z125 Encounter for screening for malignant neoplasm of prostate: Secondary | ICD-10-CM | POA: Diagnosis not present

## 2021-05-31 DIAGNOSIS — R0683 Snoring: Secondary | ICD-10-CM

## 2021-05-31 DIAGNOSIS — E1169 Type 2 diabetes mellitus with other specified complication: Secondary | ICD-10-CM | POA: Diagnosis not present

## 2021-05-31 DIAGNOSIS — Z1389 Encounter for screening for other disorder: Secondary | ICD-10-CM | POA: Diagnosis not present

## 2021-05-31 DIAGNOSIS — G4730 Sleep apnea, unspecified: Secondary | ICD-10-CM | POA: Diagnosis not present

## 2021-05-31 DIAGNOSIS — E7800 Pure hypercholesterolemia, unspecified: Secondary | ICD-10-CM | POA: Diagnosis not present

## 2021-05-31 DIAGNOSIS — R413 Other amnesia: Secondary | ICD-10-CM | POA: Diagnosis not present

## 2021-05-31 DIAGNOSIS — D352 Benign neoplasm of pituitary gland: Secondary | ICD-10-CM | POA: Diagnosis not present

## 2021-05-31 NOTE — Procedures (Signed)
° °  Sleep Study Report  Patient Information Study Date: 05/30/21 Patient Name: Joshua Vargas Patient ID: [id number] Birth Date: 2045-05-23 Age: 76 Gender: Male BMI: 28.1 (W=196 lb, H=5' 10'') Neck Circ.: 81 ''  Referring Physician: Fransico Him, MD  TEST DESCRIPTION: Home sleep apnea testing was completed using the WatchPat, a Type 1 device, utilizing peripheral arterial tonometry (PAT), chest movement, actigraphy, pulse oximetry, pulse rate, body position and snore. AHI was calculated with apnea and hypopnea using valid sleep time as the denominator. RDI includes apneas, hypopneas, and RERAs. The data acquired and the scoring of sleep and all associated events were performed in accordance with the recommended standards and specifications as outlined in the AASM Manual for the Scoring of Sleep and Associated Events 2.2.0 (2015).  FINDINGS: 1. Mild Obstructive Sleep Apnea with AHI 12.1/hr. 2. No significant Central Sleep Apnea with pAHIc 4.1/hr. 3. Oxygen desaturations as low as 86%. 4. Moderate snoring was present. O2 sats were < 88% for 28.3 min. 5. Total sleep time was 8 hrs and 1 min. 6. 28.3% of total sleep time was spent in REM sleep. 7. Shortened sleep onset latency at 7 min. 8. Prolonged REM sleep onset latency at 146 min. 9. Total awakenings were 12.  DIAGNOSIS: Mild Obstructive Sleep Apnea (G47.33)  RECOMMENDATIONS: Clinical correlation of these findings is necessary. The decision to treat obstructive sleep apnea (OSA) is usually based on the presence of apnea symptoms or the presence of associated medical conditions such as Hypertension, Congestive Heart Failure, Atrial Fibrillation or Obesity. The most common symptoms of OSA are snoring, gasping for breath while sleeping, daytime sleepiness and fatigue.  2. Initiating apnea therapy is recommended given the presence of symptoms and/or associated conditions. Recommend proceeding with one of the following:   a.  Auto-CPAP therapy with a pressure range of 5-20cm H2O.   b. An oral appliance (OA) that can be obtained from certain dentists with expertise in sleep medicine. These are primarily of use in non-obese patients with mild and moderate disease.   c. An ENT consultation which may be useful to look for specific causes of obstruction and possible treatment options.   d. If patient is intolerant to PAP therapy, consider referral to ENT for evaluation for hypoglossal nerve stimulator.  3. Close follow-up is necessary to ensure success with CPAP or oral appliance therapy for maximum benefit .  4. A follow-up oximetry study on CPAP is recommended to assess the adequacy of therapy and determine the need for supplemental oxygen or the potential need for Bi-level therapy. An arterial blood gas to determine the adequacy of baseline ventilation and oxygenation should also be considered.  5. Healthy sleep recommendations include: adequate nightly sleep (normal 7-9 hrs/night), avoidance of caffeine after noon and alcohol near bedtime, and maintaining a sleep environment that is cool, dark and quiet.  6. Weight loss for overweight patients is recommended. Even modest amounts of weight loss can significantly improve the severity of sleep apnea.  7. Snoring recommendations include: weight loss where appropriate, side sleeping, and avoidance of alcohol before bed.  8. Operation of motor vehicle should be avoided when sleepy.  Signature: Electronically Signed: 05/31/21 Fransico Him, MD; [facility]; [place], American Board of Sleep Medicine

## 2021-05-31 NOTE — Telephone Encounter (Signed)
° °  Pt and his wife called, they wanted to know if there's medication the pt needs to held prior lexiscan tomorrow

## 2021-05-31 NOTE — Telephone Encounter (Signed)
Spoke with the pts wife Lesleigh Noe (on [place]) and went over the pts lexiscan myoview instructions verbatim and as listed below, with her over the phone.  Pt will have his lexiscan done tomorrow 2/21.  Wife is aware of his appt date and time.   Wife verbalized understanding and agrees with this plan. Wife was more than gracious for all the assistance provided.     Dear Mr. Pyles,   You are scheduled for a Myocardial Perfusion Imaging Study on: 06-01-20   at 7:45 .  Please arrive 15 minutes prior to your appointment time for registration and insurance purposes.   The test will take approximately 3 to 4 hours to complete; you may bring reading material.  If someone comes with you to your appointment, they will need to remain in the main lobby due to limited space in the testing area. **If you are pregnant or breastfeeding, please notify the nuclear lab prior to your appointment**   How to prepare for your Myocardial Perfusion Test: Do not eat or drink 3 hours prior to your test, except you may have water. Do not consume products containing caffeine (regular or decaffeinated) 12 hours prior to your test. (ex: coffee, chocolate, sodas, tea). Do bring a list of your current medications with you.  If not listed below, you may take your medications as normal. Do wear comfortable clothes (no dresses or overalls) and walking shoes, tennis shoes preferred (No heels or open toe shoes are allowed). Do NOT wear cologne, perfume, aftershave, or lotions (deodorant is allowed). If these instructions are not followed, your test will have to be rescheduled.   Please report to [address] for your test.  If you have questions or concerns about your appointment, you can call the Nuclear Lab at [phone].   If you cannot keep your appointment, please provide 24 hours notification to the Nuclear Lab, to avoid a possible $50 charge to your account.  [place] Delisa, [phone]

## 2021-06-01 ENCOUNTER — Other Ambulatory Visit: Payer: Medicare HMO | Admitting: *Deleted

## 2021-06-01 ENCOUNTER — Other Ambulatory Visit: Payer: Self-pay

## 2021-06-01 ENCOUNTER — Ambulatory Visit (HOSPITAL_COMMUNITY): Payer: Medicare HMO | Attending: Cardiology

## 2021-06-01 DIAGNOSIS — R0683 Snoring: Secondary | ICD-10-CM | POA: Diagnosis not present

## 2021-06-01 DIAGNOSIS — I10 Essential (primary) hypertension: Secondary | ICD-10-CM | POA: Diagnosis not present

## 2021-06-01 DIAGNOSIS — I2510 Atherosclerotic heart disease of native coronary artery without angina pectoris: Secondary | ICD-10-CM | POA: Diagnosis not present

## 2021-06-01 DIAGNOSIS — E7800 Pure hypercholesterolemia, unspecified: Secondary | ICD-10-CM | POA: Insufficient documentation

## 2021-06-01 LAB — BASIC METABOLIC PANEL
BUN/Creatinine Ratio: 19 (ref 10–24)
BUN: 21 mg/dL (ref 8–27)
CO2: 22 mmol/L (ref 20–29)
Calcium: 9.7 mg/dL (ref 8.6–10.2)
Chloride: 99 mmol/L (ref 96–106)
Creatinine, Ser: 1.1 mg/dL (ref 0.76–1.27)
Glucose: 84 mg/dL (ref 70–99)
Potassium: 4.3 mmol/L (ref 3.5–5.2)
Sodium: 137 mmol/L (ref 134–144)
eGFR: 70 mL/min/{1.73_m2} (ref >59)

## 2021-06-01 LAB — MYOCARDIAL PERFUSION IMAGING
LV dias vol: 152 mL (ref 62–150)
LV sys vol: 75 mL
Nuc Stress EF: 51 %
Peak HR: 92 {beats}/min
Rest HR: 71 {beats}/min
Rest Nuclear Isotope Dose: 10.6 mCi
SDS: 1
SRS: 0
SSS: 1
ST Depression (mm): 0 mm
Stress Nuclear Isotope Dose: 31.8 mCi
TID: 1.06

## 2021-06-01 MED ORDER — TECHNETIUM TC 99M TETROFOSMIN IV KIT
31.8000 | PACK | Freq: Once | INTRAVENOUS | Status: AC | PRN
  Administered 2021-06-01: 31.8 via INTRAVENOUS
  Filled 2021-06-01: qty 32

## 2021-06-01 MED ORDER — TECHNETIUM TC 99M TETROFOSMIN IV KIT
10.6000 | PACK | Freq: Once | INTRAVENOUS | Status: AC | PRN
  Administered 2021-06-01: 10.6 via INTRAVENOUS
  Filled 2021-06-01: qty 11

## 2021-06-01 MED ORDER — REGADENOSON 0.4 MG/5ML IV SOLN
0.4000 mg | Freq: Once | INTRAVENOUS | Status: AC
  Administered 2021-06-01: 0.4 mg via INTRAVENOUS

## 2021-06-02 ENCOUNTER — Telehealth: Payer: Self-pay | Admitting: *Deleted

## 2021-06-02 DIAGNOSIS — D235 Other benign neoplasm of skin of trunk: Secondary | ICD-10-CM | POA: Diagnosis not present

## 2021-06-02 DIAGNOSIS — B351 Tinea unguium: Secondary | ICD-10-CM | POA: Diagnosis not present

## 2021-06-02 DIAGNOSIS — D2372 Other benign neoplasm of skin of left lower limb, including hip: Secondary | ICD-10-CM | POA: Diagnosis not present

## 2021-06-02 NOTE — Telephone Encounter (Signed)
-----   Message from Lauralee Evener, [place] sent at 06/02/2021  8:21 AM EST -----  ----- Message ----- From: Sueanne Margarita, MD Sent: 05/31/2021   1:54 PM EST To: [facility] Sleep Studies  Order ResMed CPAP on auto from 4 to 15cm H2O with heated humidity, mask of choice.  Followup in 6 weeks with me.

## 2021-06-02 NOTE — Telephone Encounter (Signed)
The patient has been notified of the result and Left detailed message on voicemail and informed patient to call back. Joshua Vargas, [place] 06/02/2021 5:04 PM    Upon patient request DME selection is [facility] Patient understands he will be contacted by [facility] to set up his cpap. Patient understands to call if [facility] does not contact him with new setup in a timely manner. Patient understands they will be called once confirmation has been received from [facility]/ that they have received their new machine to schedule 10 week follow up appointment.   [place] notified of new cpap order  Please add to airview Patient was grateful for the call and thanked me.

## 2021-06-15 DIAGNOSIS — Z961 Presence of intraocular lens: Secondary | ICD-10-CM | POA: Diagnosis not present

## 2021-06-15 DIAGNOSIS — E119 Type 2 diabetes mellitus without complications: Secondary | ICD-10-CM | POA: Diagnosis not present

## 2021-06-15 DIAGNOSIS — H5213 Myopia, bilateral: Secondary | ICD-10-CM | POA: Diagnosis not present

## 2021-06-15 LAB — HM DIABETES EYE EXAM

## 2021-06-23 ENCOUNTER — Other Ambulatory Visit: Payer: Self-pay

## 2021-06-23 ENCOUNTER — Ambulatory Visit: Payer: Medicare HMO | Admitting: Pharmacist

## 2021-06-23 VITALS — BP 148/62 | HR 88

## 2021-06-23 DIAGNOSIS — I10 Essential (primary) hypertension: Secondary | ICD-10-CM | POA: Diagnosis not present

## 2021-06-23 NOTE — Progress Notes (Signed)
Patient ID: Joshua Vargas                 DOB: [birth year]                      MRN: [id number] ? ? ? ? ?HPI: ?Joshua Vargas is a 76 y.o. male referred by Dr. Radford Pax to HTN clinic. PMH is significant for CAD with cath in 2019 showing 70% D1, 25% prox LAD occlusions, HLD, HTN, and DM2. He was last seen by Dr Radford Pax a month ago where BP was 144/68 and irbesartan-HCTZ was changed to irbesartan and chlorthalidone with stable f/u BMET. He also reported a few episodes of chest pain and was scheduled for lexiscan myoview which was low risk, and reported symptoms of OSA and was set up for a sleep study. This showed mild OSA and pt was set up with CPAP. ? ?Pt presents today for follow up. Reports tolerating medications well. Denies LE edema, headache, and dizziness. Reports notable improvement in home BP readings, 025-852 systolic. None above the 130s. Has lost 20 lbs recently from [facility] therapy for his DM. Denies NSAID use. Took AM meds 1.5 hrs ago. Has had 2 cups of coffee so far today. ? ?Current HTN meds: amlodipine 10mg  daily, irbesartan 300mg  daily, chlorthalidone 25mg  daily ? ?BP goal: <130/31mmHg ? ?Family History: Cancer in his father; Diabetes in his brother; Heart attack (age of onset: 35) in his brother.  ? ?Social History: Denies alcohol, drug, and tobacco use. ? ?Diet: 2-4 cups of coffee a day, doesn't add salt to food. Wife cooks at home. Oatmeal in the morning. ? ?Exercise: Walks 30 minutes each day ? ?Home BP readings:  ? ?Wt Readings from Last 3 Encounters:  ?06/01/21 195 lb (88.5 kg)  ?05/25/21 195 lb (88.5 kg)  ?04/05/21 189 lb 9.5 oz (86 kg)  ? ?BP Readings from Last 3 Encounters:  ?05/25/21 (!) 144/68  ?04/05/21 (!) 194/77  ?12/02/20 (!) 179/74  ? ?Pulse Readings from Last 3 Encounters:  ?05/25/21 88  ?04/05/21 85  ?12/02/20 75  ? ? ?Renal function: ?CrCl cannot be calculated (Patient's most recent lab result is older than the maximum 21 days allowed.). ? ?Past Medical History:  ?Diagnosis Date  ?  BEP (benign enlargement of prostate)   ? BPH (benign prostatic hyperplasia)   ? Constipation   ? Coronary artery disease   ? Elevated LDL cholesterol level   ? Family history of pancreatic cancer   ? History of blood transfusion 1969  ? "w/bone transplant"  ? History of colonic polyps   ? Hyperlipemia   ? Hypertension   ? Knee pain   ? Memory loss   ? Paresthesia   ? Pneumonia 1990s X 1; ~ 2000 X 1  ? Prolactinoma ([place])   ? Type II diabetes mellitus ([place])   ? Vitreous floaters of right eye   ? ? ?Current Outpatient Medications on File Prior to Visit  ?Medication Sig Dispense Refill  ? acetaminophen (TYLENOL) 500 MG tablet Take 500 mg by mouth as needed. pain    ? albuterol (VENTOLIN HFA) 108 (90 Base) MCG/ACT inhaler Inhale 1-2 puffs into the lungs every 6 (six) hours as needed for wheezing or shortness of breath. 1 each 0  ? amLODipine (NORVASC) 10 MG tablet TAKE 1 TABLET EVERY DAY 90 tablet 0  ? aspirin 81 MG EC tablet Take 81 mg by mouth daily.    ? Blood Glucose Monitoring Suppl (ACCU-CHEK  GUIDE) w/Device KIT 1 Device by Does not apply route daily. 1 kit 0  ? chlorthalidone (HYGROTON) 25 MG tablet Take 1 tablet (25 mg total) by mouth daily. 90 tablet 3  ? Fluticasone Propionate (XHANCE) 93 MCG/ACT EXHU Place 2 sprays into the nose 2 (two) times daily. 32 mL 5  ? glimepiride (AMARYL) 2 MG tablet TAKE 1/2 TABLET EVERY DAY AT DINNER 90 tablet 0  ? glucose blood (ACCU-CHEK GUIDE) test strip Use as instructed 100 each 12  ? irbesartan (AVAPRO) 300 MG tablet Take 1 tablet (300 mg total) by mouth daily. 90 tablet 3  ? LUMIGAN 0.01 % SOLN Place 1 drop into both eyes at bedtime.  1  ? metFORMIN (GLUCOPHAGE) 1000 MG tablet TAKE 1 TABLET TWICE DAILY WITH A MEAL 180 tablet 0  ? pioglitazone (ACTOS) 30 MG tablet TAKE 1 TABLET EVERY DAY 90 tablet 0  ? potassium chloride (KLOR-CON M) 10 MEQ tablet TAKE 1 TABLET EVERY DAY 90 tablet 0  ? pravastatin (PRAVACHOL) 40 MG tablet TAKE 1 TABLET EVERY DAY 90 tablet 0  ? Semaglutide,0.25  or 0.5MG/DOS, (OZEMPIC, 0.25 OR 0.5 MG/DOSE,) 2 MG/1.5ML SOPN Inject 0.25 mg weekly, increase To 0.5 mg weekly after 4 injections 1.5 mL 2  ? tadalafil (CIALIS) 5 MG tablet TAKE 1 TABLET EVERY DAY AS NEEDED FOR ERECTILE DYSFUNCTION 30 tablet 0  ? ?No current facility-administered medications on file prior to visit.  ? ? ?Allergies  ?Allergen Reactions  ? Atorvastatin Itching  ?  Other reaction(s): Other, Unknown  ? Dextran Rash  ?  Other Reaction: Not Assessed ?Other reaction(s): Other ?Other Reaction: Not Assessed ?Other Reaction: Not Assessed ?Other Reaction: Not Assessed  ? ? ? ?Assessment/Plan: ? ?1. Hypertension - BP above goal < 130/75mHg in clinic today, however pt reports home BP readings are generally at goal. Home log and BP cuff were not brought today. Will continue amlodipine 168mdaily, irbesartan 300110maily, and chlorthalidone 64m90mily. Advised pt to continue monitoring and recording BP readings and to bring these along with home cuff to next visit in 1 month to confirm accuracy of home BP cuff before any med changes are made. Can add on carvedilol per Dr TurnRadford Paxneeded. Also encouraged pt to decrease coffee intake to 2-3 cups per day. ? ?Briteny Fulghum E. Reeve Turnley, PharmD, BCACP, CPP ?[id number] [place] [id number]: ([phone]: ([phone]/2023 8:50 AM ? ? ?

## 2021-06-23 NOTE — Patient Instructions (Addendum)
It was nice to see you today ? ?Your blood pressure goal is < 130/13mHg ? ?Continue taking your current medications ? ?Aim for < 2,'000mg'$  of daily sodium ? ?Aim for 150 minutes of activity each week ? ?Write down your blood pressure readings and bring these and your blood pressure cuff to your next visit on Wednesday, April 12th at 8:30am for a blood pressure check ? ?

## 2021-06-28 ENCOUNTER — Other Ambulatory Visit: Payer: Self-pay | Admitting: Endocrinology

## 2021-06-28 DIAGNOSIS — E1165 Type 2 diabetes mellitus with hyperglycemia: Secondary | ICD-10-CM

## 2021-07-05 ENCOUNTER — Other Ambulatory Visit (INDEPENDENT_AMBULATORY_CARE_PROVIDER_SITE_OTHER): Payer: Medicare HMO

## 2021-07-05 ENCOUNTER — Other Ambulatory Visit: Payer: Self-pay

## 2021-07-05 DIAGNOSIS — E782 Mixed hyperlipidemia: Secondary | ICD-10-CM | POA: Diagnosis not present

## 2021-07-05 DIAGNOSIS — D352 Benign neoplasm of pituitary gland: Secondary | ICD-10-CM

## 2021-07-05 DIAGNOSIS — E119 Type 2 diabetes mellitus without complications: Secondary | ICD-10-CM | POA: Diagnosis not present

## 2021-07-05 LAB — COMPREHENSIVE METABOLIC PANEL
ALT: 13 U/L (ref 0–53)
AST: 19 U/L (ref 0–37)
Albumin: 4.1 g/dL (ref 3.5–5.2)
Alkaline Phosphatase: 90 U/L (ref 39–117)
BUN: 12 mg/dL (ref 6–23)
CO2: 33 mEq/L — ABNORMAL HIGH (ref 19–32)
Calcium: 9.8 mg/dL (ref 8.4–10.5)
Chloride: 100 mEq/L (ref 96–112)
Creatinine, Ser: 0.94 mg/dL (ref 0.40–1.50)
GFR: 79.48 mL/min (ref >60.00)
Glucose, Bld: 105 mg/dL — ABNORMAL HIGH (ref 70–99)
Potassium: 4.2 mEq/L (ref 3.5–5.1)
Sodium: 139 mEq/L (ref 135–145)
Total Bilirubin: 0.4 mg/dL (ref 0.2–1.2)
Total Protein: 7.6 g/dL (ref 6.0–8.3)

## 2021-07-05 LAB — LIPID PANEL
Cholesterol: 156 mg/dL (ref 0–200)
HDL: 55 mg/dL (ref >39.00)
LDL Cholesterol: 70 mg/dL (ref 0–99)
NonHDL: 100.74
Total CHOL/HDL Ratio: 3
Triglycerides: 153 mg/dL — ABNORMAL HIGH (ref 0.0–149.0)
VLDL: 30.6 mg/dL (ref 0.0–40.0)

## 2021-07-05 LAB — HEMOGLOBIN A1C: Hgb A1c MFr Bld: 7.6 % — ABNORMAL HIGH (ref 4.6–6.5)

## 2021-07-06 LAB — PROLACTIN: Prolactin: 13 ng/mL (ref 4.0–15.2)

## 2021-07-06 LAB — TESTOSTERONE: Testosterone: 396.03 ng/dL (ref 300.00–890.00)

## 2021-07-06 NOTE — Progress Notes (Signed)
? ? ?Patient ID: Joshua Vargas, male   DOB: October 13, 1945, 76 y.o.   MRN: [id number] ? ? ? ?Reason for Appointment: follow-up of various  problems ? ?History of Present Illness  ? ?Type 2 DIABETES MELITUS, date of diagnosis: 2002     ? ?Previous history: He was initially treated with metformin alone and then subsequently was also given Actos and Amaryl.  ?He has had fairly good control but somewhat inconsistent because of difficulties with compliance with diet and exercise periodically. A1c previously has fluctuated between about 6.7-7.6 ?In 6/15 he was started on Victoza which he wanted to try because of difficulty losing weight and better control ? ?Recent history:  ? ?Non-insulin hypoglycemic drugs: Actoplusmet 15/850 once a day, Amaryl 2 mg in evening, Trulicity 1.5 mg weekly          ? ?His A1c is higher than usual at 7.6, previously 6.2 ? ?He is again irregular with his follow-up and has not been seen since 8/22 ? ?Current management, blood sugar patterns and problems identified: ?He says that when he was taking Ozempic last year his blood sugars were higher but does not know how much  ?Again does not bring his monitor for download and not clear how often his monitoring is being done  ?All the lab fasting glucose was 105 his A1c is higher than usual and likely has high postprandial readings  ?He did get Trulicity approved by his insurance a couple of months ago but in the last month has taken only 0.75 mg prescription ?Currently has not taken any Trulicity for a week or so  ?He says he is trying to walk 20 to 30 minutes but slowly  ?His weight recently appears to be somewhat lower  ?He says that because of his sugars being higher he went back up to 2 mg Amaryl instead of 1 mg recommended before ? ? ? ?Side effects from medications:  anorexia with 1.2 mg Victoza ?      ?Monitors blood glucose: Sporadically   Glucometer:  One Touch.     ?      ?Blood Glucose readings at home recent range: ?70-168 at various  times by recall ? ?              ?Dietician visit: Most recent: 06/2016    ?       ? ?Wt Readings from Last 3 Encounters:  ?07/07/21 190 lb 12.8 oz (86.5 kg)  ?06/01/21 195 lb (88.5 kg)  ?05/25/21 195 lb (88.5 kg)  ? ?LABS: ? ?Lab Results  ?Component Value Date  ? HGBA1C 7.6 (H) 07/05/2021  ? HGBA1C 6.2 11/18/2020  ? HGBA1C 6.2 08/18/2020  ? ?Lab Results  ?Component Value Date  ? MICROALBUR 5.5 (H) 03/19/2020  ? [place] 70 07/05/2021  ? CREATININE 0.94 07/05/2021  ? ? ? OTHER problems are discussed in review of systems ? ? ?LABS: ? ?Lab on 07/05/2021  ?Component Date Value Ref Range Status  ? Prolactin 07/05/2021 13.0  4.0 - 15.2 ng/mL Final  ? Testosterone 07/05/2021 396.03  300.00 - 890.00 ng/dL Final  ? Cholesterol 07/05/2021 156  0 - 200 mg/dL Final  ? ATP III Classification       Desirable:  < 200 mg/dL               Borderline High:  200 - 239 mg/dL          High:  > = 240 mg/dL  ? Triglycerides 07/05/2021 153.0 (H)  0.0 - 149.0 mg/dL Final  ? Normal:  <150 mg/dLBorderline High:  150 - 199 mg/dL  ? HDL 07/05/2021 55.00  >39.00 mg/dL Final  ? VLDL 07/05/2021 30.6  0.0 - 40.0 mg/dL Final  ? LDL Cholesterol 07/05/2021 70  0 - 99 mg/dL Final  ? Total CHOL/HDL Ratio 07/05/2021 3   Final  ?                Men          Women1/2 Average Risk     3.4          3.3Average Risk          5.0          4.42X Average Risk          9.6          7.13X Average Risk          15.0          11.0                      ? NonHDL 07/05/2021 100.74   Final  ? NOTE:  Non-HDL goal should be 30 mg/dL higher than patient's LDL goal (i.e. LDL goal of < 70 mg/dL, would have non-HDL goal of < 100 mg/dL)  ? Sodium 07/05/2021 139  135 - 145 mEq/L Final  ? Potassium 07/05/2021 4.2  3.5 - 5.1 mEq/L Final  ? Chloride 07/05/2021 100  96 - 112 mEq/L Final  ? CO2 07/05/2021 33 (H)  19 - 32 mEq/L Final  ? Glucose, Bld 07/05/2021 105 (H)  70 - 99 mg/dL Final  ? BUN 07/05/2021 12  6 - 23 mg/dL Final  ? Creatinine, Ser 07/05/2021 0.94  0.40 - 1.50 mg/dL  Final  ? Total Bilirubin 07/05/2021 0.4  0.2 - 1.2 mg/dL Final  ? Alkaline Phosphatase 07/05/2021 90  39 - 117 U/L Final  ? AST 07/05/2021 19  0 - 37 U/L Final  ? ALT 07/05/2021 13  0 - 53 U/L Final  ? Total Protein 07/05/2021 7.6  6.0 - 8.3 g/dL Final  ? Albumin 07/05/2021 4.1  3.5 - 5.2 g/dL Final  ? GFR 07/05/2021 79.48  >60.00 mL/min Final  ? Calculated using the CKD-EPI Creatinine Equation (2021)  ? Calcium 07/05/2021 9.8  8.4 - 10.5 mg/dL Final  ? Hgb A1c MFr Bld 07/05/2021 7.6 (H)  4.6 - 6.5 % Final  ? Glycemic Control Guidelines for People with Diabetes:Non Diabetic:  <6%Goal of Therapy: <7%Additional Action Suggested:  >8%   ? ? ?Allergies as of 07/07/2021   ? ?   Reactions  ? Atorvastatin Itching  ? Other reaction(s): Other, Unknown  ? Dextran Rash  ? Other Reaction: Not Assessed ?Other reaction(s): Other ?Other Reaction: Not Assessed ?Other Reaction: Not Assessed ?Other Reaction: Not Assessed  ? ?  ? ?  ?Medication List  ?  ? ?  ? Accurate as of July 07, 2021  7:40 PM. If you have any questions, ask your nurse or doctor.  ?  ?  ? ?  ? ?STOP taking these medications   ? ?Trulicity 4.25 ZD/6.3OV Sopn ?Generic drug: Dulaglutide ?Replaced by: Trulicity 1.5 FI/4.3PI Sopn ?Stopped by: Elayne Snare, MD ?  ? ?  ? ?TAKE these medications   ? ?Accu-Chek Guide test strip ?Generic drug: glucose blood ?Use as instructed ?  ?Accu-Chek Guide w/Device Kit ?1 Device by Does not apply route daily. ?  ?acetaminophen  500 MG tablet ?Commonly known as: TYLENOL ?Take 500 mg by mouth as needed. pain ?  ?albuterol 108 (90 Base) MCG/ACT inhaler ?Commonly known as: VENTOLIN HFA ?Inhale 1-2 puffs into the lungs every 6 (six) hours as needed for wheezing or shortness of breath. ?  ?amLODipine 10 MG tablet ?Commonly known as: NORVASC ?TAKE 1 TABLET EVERY DAY ?  ?aspirin 81 MG EC tablet ?Take 81 mg by mouth daily. ?  ?chlorthalidone 25 MG tablet ?Commonly known as: HYGROTON ?Take 1 tablet (25 mg total) by mouth daily. ?  ?glimepiride 2  MG tablet ?Commonly known as: AMARYL ?TAKE 1/2 TABLET EVERY DAY AT DINNER ?  ?irbesartan 300 MG tablet ?Commonly known as: AVAPRO ?Take 1 tablet (300 mg total) by mouth daily. ?  ?metFORMIN 1000 MG tablet ?Commonly known as: GLUCOPHAGE ?TAKE 1 TABLET TWICE DAILY WITH A MEAL ?  ?pioglitazone 30 MG tablet ?Commonly known as: ACTOS ?TAKE 1 TABLET EVERY DAY ?  ?potassium chloride 10 MEQ tablet ?Commonly known as: KLOR-CON M ?TAKE 1 TABLET EVERY DAY ?  ?pravastatin 40 MG tablet ?Commonly known as: PRAVACHOL ?TAKE 1 TABLET EVERY DAY ?  ?tadalafil 5 MG tablet ?Commonly known as: CIALIS ?TAKE 1 TABLET EVERY DAY AS NEEDED FOR ERECTILE DYSFUNCTION ?  ?Trulicity 1.5 UO/3.7GB Sopn ?Generic drug: Dulaglutide ?Inject weekly ?Replaces: Trulicity 0.21 JD/5.5MC Sopn ?Started by: Elayne Snare, MD ?  ? ?  ? ? ?Allergies:  ?Allergies  ?Allergen Reactions  ? Atorvastatin Itching  ?  Other reaction(s): Other, Unknown  ? Dextran Rash  ?  Other Reaction: Not Assessed ?Other reaction(s): Other ?Other Reaction: Not Assessed ?Other Reaction: Not Assessed ?Other Reaction: Not Assessed  ? ? ?Past Medical History:  ?Diagnosis Date  ? BEP (benign enlargement of prostate)   ? BPH (benign prostatic hyperplasia)   ? Constipation   ? Coronary artery disease   ? Elevated LDL cholesterol level   ? Family history of pancreatic cancer   ? History of blood transfusion 1969  ? "w/bone transplant"  ? History of colonic polyps   ? Hyperlipemia   ? Hypertension   ? Knee pain   ? Memory loss   ? Paresthesia   ? Pneumonia 1990s X 1; ~ 2000 X 1  ? Prolactinoma ([place])   ? Type II diabetes mellitus ([place])   ? Vitreous floaters of right eye   ? ? ?Past Surgical History:  ?Procedure Laterality Date  ? CARDIAC CATHETERIZATION  ?2006; 01/25/2018  ? ELBOW SURGERY Left   ? "something to do w/the muscle"  ? JOINT REPLACEMENT    ? LEFT HEART CATH AND CORONARY ANGIOGRAPHY N/A 01/26/2018  ? Procedure: LEFT HEART CATH AND CORONARY ANGIOGRAPHY;  Flock: [place], Peter M, MD;   Location: [facility];  Service: Cardiovascular;  Laterality: N/A;  ? PICC LINE INSERTION  ~ 2005  ? "took it out months later"  ? RESECTION BONE TUMOR FEMUR Left 1969  ? "transplanted bone"  ? TOTAL

## 2021-07-07 ENCOUNTER — Encounter: Payer: Self-pay | Admitting: Endocrinology

## 2021-07-07 ENCOUNTER — Ambulatory Visit: Payer: Medicare HMO | Admitting: Endocrinology

## 2021-07-07 ENCOUNTER — Other Ambulatory Visit: Payer: Self-pay

## 2021-07-07 VITALS — BP 148/80 | HR 62 | Ht 69.5 in | Wt 190.8 lb

## 2021-07-07 DIAGNOSIS — E782 Mixed hyperlipidemia: Secondary | ICD-10-CM

## 2021-07-07 DIAGNOSIS — E221 Hyperprolactinemia: Secondary | ICD-10-CM | POA: Diagnosis not present

## 2021-07-07 DIAGNOSIS — E1165 Type 2 diabetes mellitus with hyperglycemia: Secondary | ICD-10-CM

## 2021-07-07 DIAGNOSIS — I10 Essential (primary) hypertension: Secondary | ICD-10-CM

## 2021-07-07 MED ORDER — TRULICITY 1.5 MG/0.5ML ~~LOC~~ SOAJ: SUBCUTANEOUS | 2 refills | Status: DC

## 2021-07-07 NOTE — Patient Instructions (Signed)
Cut Glimeperide to 1/2  ?

## 2021-07-09 ENCOUNTER — Ambulatory Visit (INDEPENDENT_AMBULATORY_CARE_PROVIDER_SITE_OTHER): Payer: Medicare HMO | Admitting: Podiatry

## 2021-07-09 DIAGNOSIS — M79674 Pain in right toe(s): Secondary | ICD-10-CM | POA: Diagnosis not present

## 2021-07-09 DIAGNOSIS — Z0001 Encounter for general adult medical examination with abnormal findings: Secondary | ICD-10-CM | POA: Insufficient documentation

## 2021-07-09 DIAGNOSIS — E1140 Type 2 diabetes mellitus with diabetic neuropathy, unspecified: Secondary | ICD-10-CM | POA: Diagnosis not present

## 2021-07-09 DIAGNOSIS — B351 Tinea unguium: Secondary | ICD-10-CM | POA: Diagnosis not present

## 2021-07-09 DIAGNOSIS — G4730 Sleep apnea, unspecified: Secondary | ICD-10-CM | POA: Insufficient documentation

## 2021-07-09 DIAGNOSIS — M79675 Pain in left toe(s): Secondary | ICD-10-CM | POA: Diagnosis not present

## 2021-07-09 DIAGNOSIS — H9190 Unspecified hearing loss, unspecified ear: Secondary | ICD-10-CM | POA: Insufficient documentation

## 2021-07-14 ENCOUNTER — Encounter: Payer: Self-pay | Admitting: Podiatry

## 2021-07-14 NOTE — Progress Notes (Signed)
?  Subjective:  ?Patient ID: Joshua Vargas, male    DOB: 1945-09-26,  MRN: [id number] ? ?Joshua Vargas presents to clinic today for preventative diabetic foot care and painful elongated mycotic toenails 1-5 bilaterally which are tender when wearing enclosed shoe gear. Pain is relieved with periodic professional debridement. ? ?Last HgA1c was 7.6%. Patient did not check blood glucose today. ? ?New problem(s): None.  ? ?PCP is Antony Contras, MD , and last visit was May 31, 2021. ? ?Allergies  ?Allergen Reactions  ? Atorvastatin Itching  ?  Other reaction(s): Other, Unknown ?Other reaction(s): increased LFT's  ? Dextran Rash  ?  Other Reaction: Not Assessed ?Other reaction(s): Other ?Other Reaction: Not Assessed ?Other Reaction: Not Assessed ?Other Reaction: Not Assessed  ? ? ?Review of Systems: Negative except as noted in the HPI. ? ?Objective: No changes noted in today's physical examination. ? ?Objective:  ? ?Vascular Examination: ?Vascular status intact b/l with palpable pedal pulses. Pedal hair present b/l. CFT immediate b/l. No edema. No pain with calf compression b/l. Skin temperature gradient WNL b/l.  ? ?Neurological Examination: ?Sensation grossly intact b/l with 10 gram monofilament. Vibratory sensation intact b/l.  ? ?Dermatological Examination: ?Pedal skin with normal turgor, texture and tone b/l. Toenails 1-5 b/l thick, discolored, elongated with subungual debris and pain on dorsal palpation. No hyperkeratotic lesions noted b/l.  ? ?Musculoskeletal Examination: ?Muscle strength 5/5 to b/l LE. Limited joint ROM to the bilateral ankles. ? ?Radiographs: None ? ?Last A1c:   ? ?  Latest Ref Rng & Units 07/05/2021  ?  8:35 AM 11/18/2020  ?  8:08 AM 08/18/2020  ?  8:29 AM  ?Hemoglobin A1C  ?Hemoglobin-A1c 4.6 - 6.5 % 7.6   6.2   6.2    ?  ?Assessment/Plan: ?1. Pain due to onychomycosis of toenails of both feet   ?2. Type 2 diabetes mellitus with diabetic neuropathy, unspecified whether long term insulin  use ([place])   ?  ?-Patient was evaluated and treated. All patient's and/or POA's questions/concerns answered on today's visit. ?-Examined patient. ?-Continue diabetic foot care principles: inspect feet daily, monitor glucose as recommended by PCP and/or Endocrinologist, and follow prescribed diet per PCP, Endocrinologist and/or dietician. ?-Mycotic toenails 1-5 bilaterally were debrided in length and girth with sterile nail nippers and dremel without incident. ?-Patient/POA to call should there be question/concern in the interim.  ? ?Return in about 3 months (around 10/08/2021). ? ?Marzetta Board, DPM  ?

## 2021-07-20 NOTE — Progress Notes (Signed)
Patient ID: Joshua Vargas                 DOB: 08/20/1945                      MRN: [id number] ? ? ? ?HPI: ?Joshua Vargas is a 76 y.o. male referred by Dr. Radford Pax to HTN clinic. PMH is significant for CAD with cath in 2019 showing 70% D1, 25% prox LAD occlusions medically managed, HLD, HTN, and DM2. He was last seen by Dr Radford Pax in 05/2021 where BP was 144/68 and irbesartan-HCTZ was changed to irbesartan and chlorthalidone with stable f/u BMET. He also reported a few episodes of chest pain and was scheduled for lexiscan myoview which was low risk, and reported symptoms of OSA and was set up for a sleep study. This showed mild OSA and pt was set up with CPAP.  ? ?At 06/23/21 visit with me, BP was above goal at 148/62, but pt reported home BPs were generally at goal. He was advised to continue monitoring BP and bring in cuff/log at next visit. BP at endocrinology appointment on 07/07/21 was elevated at 148/80 and 170/80. ?  ?Pt presents today for HTN f/u. Pt reports med compliance. Reviewed med list with pt and confirmed home BP meds. Denies dizziness, lightheadedness, HA, blurred vision, and LEE. Did not bring in home cuff, but states home BP have been running 148-168/68. He takes his medications in the morning and checks BP around mid day. Took meds ~20 mins prior to visit and had 2 cups of caffienated coffee. Has a rash on his left leg, following with dermatology.   ? ?Current HTN meds: amlodipine 2m daily, irbesartan 3074mdaily, chlorthalidone 2565maily ? ?BP goal: < 130/80 mmHg ? ?Family History: Cancer in his father; Diabetes in his brother; Heart attack (age of onset: 51)44n his brother.  ? ?Social History: Denies alcohol, drug, and tobacco use. ? ?Diet: 2-4 cups of coffee a day, doesn't add salt to food. Wife cooks at home. Oatmeal in the morning. ? ?Exercise: Walks 30 minutes each day ? ?Wt Readings from Last 3 Encounters:  ?07/07/21 190 lb 12.8 oz (86.5 kg)  ?06/01/21 195 lb (88.5 kg)  ?05/25/21 195 lb  (88.5 kg)  ? ?BP Readings from Last 3 Encounters:  ?07/07/21 (!) 148/80  ?06/23/21 (!) 148/62  ?05/25/21 (!) 144/68  ? ?Pulse Readings from Last 3 Encounters:  ?07/07/21 62  ?06/23/21 88  ?05/25/21 88  ? ? ?Renal function: ?Estimated Creatinine Clearance: 74.6 mL/min (by C-G formula based on SCr of 0.94 mg/dL). ? ?Past Medical History:  ?Diagnosis Date  ? BEP (benign enlargement of prostate)   ? BPH (benign prostatic hyperplasia)   ? Constipation   ? Coronary artery disease   ? Elevated LDL cholesterol level   ? Family history of pancreatic cancer   ? History of blood transfusion 1969  ? "w/bone transplant"  ? History of colonic polyps   ? Hyperlipemia   ? Hypertension   ? Knee pain   ? Memory loss   ? Paresthesia   ? Pneumonia 1990s X 1; ~ 2000 X 1  ? [place] ([facility] ? [place] II diabetes mellitus ([facility] ? Vitreous floaters of right eye   ? ? ?Current Outpatient Medications on File Prior to Visit  ?Medication Sig Dispense Refill  ? acetaminophen (TYLENOL) 500 MG tablet Take 500 mg by mouth as needed. pain    ?  albuterol (VENTOLIN HFA) 108 (90 Base) MCG/ACT inhaler Inhale 1-2 puffs into the lungs every 6 (six) hours as needed for wheezing or shortness of breath. (Patient not taking: Reported on 07/07/2021) 1 each 0  ? amLODipine (NORVASC) 10 MG tablet TAKE 1 TABLET EVERY DAY 90 tablet 0  ? aspirin 81 MG EC tablet Take 81 mg by mouth daily.    ? augmented betamethasone dipropionate (DIPROLENE-AF) 0.05 % cream APPLY SPARINGLY TO AFFECTED AREA TWICE A DAY    ? Blood Glucose Monitoring Suppl (ACCU-CHEK GUIDE) w/Device KIT 1 Device by Does not apply route daily. 1 kit 0  ? cabergoline (DOSTINEX) 0.5 MG tablet SMARTSIG:1 Tablet(s) By Mouth    ? chlorthalidone (HYGROTON) 25 MG tablet Take 1 tablet (25 mg total) by mouth daily. 90 tablet 3  ? Cholecalciferol (VITAMIN D3) 10 MCG (400 UNIT) CAPS SMARTSIG:1 Capsule(s) By Mouth    ? ciclopirox (PENLAC) 8 % solution SMARTSIG:1 sparingly Topical Every Night    ? doxazosin  (CARDURA) 2 MG tablet SMARTSIG:1 Tablet(s) By Mouth    ? Dulaglutide (TRULICITY) 1.5 BV/6.7OL SOPN Inject weekly 2 mL 2  ? glimepiride (AMARYL) 2 MG tablet TAKE 1/2 TABLET EVERY DAY AT DINNER 90 tablet 0  ? glucose blood (ACCU-CHEK GUIDE) test strip Use as instructed 100 each 12  ? irbesartan (AVAPRO) 300 MG tablet Take 1 tablet (300 mg total) by mouth daily. 90 tablet 3  ? irbesartan-hydrochlorothiazide (AVALIDE) 300-12.5 MG tablet SMARTSIG:1 Tablet(s) By Mouth    ? metFORMIN (GLUCOPHAGE) 1000 MG tablet TAKE 1 TABLET TWICE DAILY WITH A MEAL 180 tablet 0  ? pioglitazone (ACTOS) 30 MG tablet TAKE 1 TABLET EVERY DAY 90 tablet 0  ? potassium chloride (KLOR-CON M) 10 MEQ tablet TAKE 1 TABLET EVERY DAY 90 tablet 0  ? pravastatin (PRAVACHOL) 40 MG tablet TAKE 1 TABLET EVERY DAY 90 tablet 0  ? tadalafil (CIALIS) 5 MG tablet TAKE 1 TABLET EVERY DAY AS NEEDED FOR ERECTILE DYSFUNCTION 30 tablet 0  ? terbinafine (LAMISIL) 250 MG tablet SMARTSIG:1 Tablet(s) By Mouth    ? ?No current facility-administered medications on file prior to visit.  ? ? ?Allergies  ?Allergen Reactions  ? Atorvastatin Itching  ?  Other reaction(s): Other, Unknown ?Other reaction(s): increased LFT's  ? Dextran Rash  ?  Other Reaction: Not Assessed ?Other reaction(s): Other ?Other Reaction: Not Assessed ?Other Reaction: Not Assessed ?Other Reaction: Not Assessed  ? ? ?Assessment/Plan: ? ?1. Hypertension - BP 140/70 remains above goal < 130/80 mmHg. Will start carvedilol 6.25 mg BID (also has hx of CAD). Continue amlodipine 10 mg daily, irbesartan 300 mg daily, and chlorthalidone 25 mg daily. Discussed with pt to bring in his home cuff and BP log next visit to validate with clinic cuff. Will f/u in 1 month on 08/19/21 for BP check after starting carvedilol.  ? ?Patient seen with Debria Garret, [place] pharmacy student ? ?Megan E. Supple, PharmD, BCACP, CPP ?[id number] [place], [place] [address] ?Phone: [phone]; Fax:  [phone] ?07/21/2021 11:26 AM ? ? ? ?

## 2021-07-21 ENCOUNTER — Ambulatory Visit: Payer: Medicare HMO | Admitting: Pharmacist

## 2021-07-21 VITALS — BP 140/70 | HR 73

## 2021-07-21 DIAGNOSIS — I10 Essential (primary) hypertension: Secondary | ICD-10-CM | POA: Diagnosis not present

## 2021-07-21 MED ORDER — CARVEDILOL 6.25 MG PO TABS: 6.2500 mg | ORAL_TABLET | Freq: Two times a day (BID) | ORAL | 3 refills | Status: DC

## 2021-07-21 NOTE — Patient Instructions (Addendum)
?  It was nice to see you today ?  ?Your blood pressure goal is < 130/103mHg ?  ?Start carvedilol 6.25 mg twice daily. Continue taking other medications ?  ?Aim for < 2,'000mg'$  of daily sodium ?  ?Aim for 150 minutes of activity each week ?  ?Write down your blood pressure readings and bring these and your blood pressure cuff to your next visit in 1 month. ?

## 2021-07-27 DIAGNOSIS — N401 Enlarged prostate with lower urinary tract symptoms: Secondary | ICD-10-CM | POA: Diagnosis not present

## 2021-07-27 DIAGNOSIS — R351 Nocturia: Secondary | ICD-10-CM | POA: Diagnosis not present

## 2021-07-27 DIAGNOSIS — N5201 Erectile dysfunction due to arterial insufficiency: Secondary | ICD-10-CM | POA: Diagnosis not present

## 2021-07-27 DIAGNOSIS — R3915 Urgency of urination: Secondary | ICD-10-CM | POA: Diagnosis not present

## 2021-07-30 ENCOUNTER — Encounter: Payer: Self-pay | Admitting: Endocrinology

## 2021-08-03 DIAGNOSIS — L239 Allergic contact dermatitis, unspecified cause: Secondary | ICD-10-CM | POA: Diagnosis not present

## 2021-08-09 ENCOUNTER — Other Ambulatory Visit: Payer: Self-pay | Admitting: Endocrinology

## 2021-08-09 DIAGNOSIS — E119 Type 2 diabetes mellitus without complications: Secondary | ICD-10-CM

## 2021-08-12 ENCOUNTER — Other Ambulatory Visit: Payer: Self-pay

## 2021-08-12 DIAGNOSIS — E1165 Type 2 diabetes mellitus with hyperglycemia: Secondary | ICD-10-CM

## 2021-08-12 MED ORDER — TRUE METRIX BLOOD GLUCOSE TEST VI STRP: ORAL_STRIP | 2 refills | Status: DC

## 2021-08-12 MED ORDER — TRUE METRIX METER W/DEVICE KIT: PACK | 0 refills | Status: DC

## 2021-08-12 MED ORDER — TRUEPLUS PEN NEEDLES 32G X 4 MM MISC: 2 refills | Status: DC

## 2021-08-13 ENCOUNTER — Other Ambulatory Visit (HOSPITAL_COMMUNITY): Payer: Self-pay

## 2021-08-16 ENCOUNTER — Telehealth: Payer: Self-pay

## 2021-08-16 NOTE — Telephone Encounter (Signed)
noted 

## 2021-08-16 NOTE — Telephone Encounter (Signed)
Patient called in and left voicemail on Friday 08/13/21 that he had taken 3x the amount of metformin that he was supposed to on accident. I called patient back this morning and patient states he was and is fine. States he was a little lightheaded for a little bit but other than that he was ok ?

## 2021-08-17 ENCOUNTER — Telehealth: Payer: Self-pay | Admitting: *Deleted

## 2021-08-17 NOTE — Telephone Encounter (Signed)
Late entry: ?Fax came from Joshua Vargas stating the patient was scheduled and missed his appointment, then he ws called to reschedule and he states he would like to wait to reschedule. [facility] canceled the order. ?

## 2021-08-18 NOTE — Progress Notes (Signed)
Patient ID: Joshua Vargas                 DOB: [birth year]                      MRN: [id number] ? ? ? ?HPI: ?Joshua Vargas is a 76 y.o. male referred by Dr. Radford Vargas to HTN clinic. PMH is significant for CAD with cath in 2019 showing 70% D1, 25% prox LAD occlusions medically managed, HLD, HTN, DM2, OSA with CPAP. He was last seen by Dr Joshua Vargas in 05/2021 where BP was 144/68 and irbesartan-HCTZ was changed to irbesartan and chlorthalidone with stable f/u BMET. He also reported a few episodes of chest pain and was scheduled for lexiscan myoview which was low risk, and reported symptoms of OSA and was set up for a sleep study. This showed mild OSA and pt was set up with CPAP. At 06/23/21 pharmacy f/u BP was above goal at 148/62, but pt reported home BPs were generally at goal. At most recent f/u on 07/21/21 his clinic BP was still elevated to 140/70 mmHg and pt was started on carvedilol 6.25 mg BID. He was advised to continue monitoring BP and bring in cuff/log at next visit.  ?  ?Pt presents today for HTN f/u. Pt did not remember to bring home cuff and BP log. Pt confirmed starting carvedilol twice daily, in the AM with metformin, and again in the evening. Takes all other HTN medications in the afternoon. However, pt reports that he stopped taking irbesartan ~1 month ago after his pharmacy told him the doctor discontinued it. Per chart review, pt should have three refills of [facility]artan remaining at [facility]. Pt now prefers to use [facility] mail order pharmacy. Took AM dose of carvedilol before his appointment today. Denies HA, blurred vision, or dizziness. No changes in diet and exercise since last visit. Pt reports daily compliance with meds. His wife helps him remember to take his medications every day.  ? ?Current HTN meds: amlodipine 16m daily, irbesartan 3049mdaily, chlorthalidone 2586maily, carvedilol 6.25 mg BID ? ?BP goal: < 130/80 mmHg ? ?Home BP readings (per pt memory): ranging SBP 140-150, DBP 67-68. This AM  154/76. Lowest recently 135/67.  ? ?Family History: Cancer in his father; Diabetes in his brother; Heart attack (age of onset: 51)72n his brother.  ? ?Social History: Denies alcohol, drug, and tobacco use. ? ?Diet: 2-4 cups of coffee a day, doesn't add salt to food. Wife cooks at home. Oatmeal in the morning. ? ?Exercise: Walks 30 minutes each day ? ?Labs: 07/05/21 K 4.2, Scr 0.94 (on potassium chloride 10 meq daily) ? ?Wt Readings from Last 3 Encounters:  ?07/07/21 190 lb 12.8 oz (86.5 kg)  ?06/01/21 195 lb (88.5 kg)  ?05/25/21 195 lb (88.5 kg)  ? ?BP Readings from Last 3 Encounters:  ?07/21/21 140/70  ?07/07/21 (!) 148/80  ?06/23/21 (!) 148/62  ? ?Pulse Readings from Last 3 Encounters:  ?07/21/21 73  ?07/07/21 62  ?06/23/21 88  ? ? ?Renal function: CrCl 83 mL/min (Scr 0.94 on 07/05/21) ?CrCl cannot be calculated (Patient's most recent lab result is older than the maximum 21 days allowed.). ? ?Past Medical History:  ?Diagnosis Date  ? BEP (benign enlargement of prostate)   ? BPH (benign prostatic hyperplasia)   ? Constipation   ? Coronary artery disease   ? Elevated LDL cholesterol level   ? Family history of pancreatic cancer   ? 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 History of colonic polyps   ? Hyperlipemia   ? Hypertension   ? Knee pain   ? Memory loss   ? Paresthesia   ? Pneumonia 1990s X 1; ~ 2000 X 1  ? Prolactinoma ([place])   ? Type II diabetes mellitus ([place])   ? Vitreous floaters of right eye   ? ? ?Current Outpatient Medications on File Prior to Visit  ?Medication Sig Dispense Refill  ? acetaminophen (TYLENOL) 500 MG tablet Take 500 mg by mouth as needed. pain    ? albuterol (VENTOLIN HFA) 108 (90 Base) MCG/ACT inhaler Inhale 1-2 puffs into the lungs every 6 (six) hours as needed for wheezing or shortness of breath. (Patient not taking: Reported on 07/07/2021) 1 each 0  ? amLODipine (NORVASC) 10 MG tablet TAKE 1 TABLET EVERY DAY 90 tablet 0  ? aspirin 81 MG EC tablet Take 81 mg by mouth  daily.    ? augmented betamethasone dipropionate (DIPROLENE-AF) 0.05 % cream APPLY SPARINGLY TO AFFECTED AREA TWICE A DAY    ? Blood Glucose Monitoring Suppl (ACCU-CHEK GUIDE) w/Device KIT 1 Device by Does not apply route daily. 1 kit 0  ? Blood Glucose Monitoring Suppl (TRUE METRIX METER) w/Device KIT Use to check blood sugar daily 1 kit 0  ? cabergoline (DOSTINEX) 0.5 MG tablet SMARTSIG:1 Tablet(s) By Mouth    ? carvedilol (COREG) 6.25 MG tablet Take 1 tablet (6.25 mg total) by mouth 2 (two) times daily. 60 tablet 3  ? chlorthalidone (HYGROTON) 25 MG tablet Take 1 tablet (25 mg total) by mouth daily. 90 tablet 3  ? Cholecalciferol (VITAMIN D3) 10 MCG (400 UNIT) CAPS SMARTSIG:1 Capsule(s) By Mouth    ? ciclopirox (PENLAC) 8 % solution SMARTSIG:1 sparingly Topical Every Night    ? Dulaglutide (TRULICITY) 1.5 IR/6.7EL SOPN INJECT 1.5MG (1 PEN) SUBCUTANEOUSLY EVERY WEEK 8 mL 1  ? glimepiride (AMARYL) 2 MG tablet TAKE 1/2 TABLET EVERY DAY AT DINNER 90 tablet 0  ? glucose blood (ACCU-CHEK GUIDE) test strip Use as instructed 100 each 12  ? glucose blood (TRUE METRIX BLOOD GLUCOSE TEST) test strip Use to check blood sugar twice a day 100 each 2  ? irbesartan (AVAPRO) 300 MG tablet Take 1 tablet (300 mg total) by mouth daily. 90 tablet 3  ? metFORMIN (GLUCOPHAGE) 1000 MG tablet TAKE 1 TABLET TWICE DAILY WITH A MEAL 180 tablet 0  ? pioglitazone (ACTOS) 30 MG tablet TAKE 1 TABLET EVERY DAY 90 tablet 0  ? potassium chloride (KLOR-CON M) 10 MEQ tablet TAKE 1 TABLET EVERY DAY 90 tablet 0  ? pravastatin (PRAVACHOL) 40 MG tablet TAKE 1 TABLET EVERY DAY 90 tablet 0  ? tadalafil (CIALIS) 5 MG tablet TAKE 1 TABLET EVERY DAY AS NEEDED FOR ERECTILE DYSFUNCTION 30 tablet 0  ? terbinafine (LAMISIL) 250 MG tablet SMARTSIG:1 Tablet(s) By Mouth    ? ?No current facility-administered medications on file prior to visit.  ? ? ?Allergies  ?Allergen Reactions  ? Atorvastatin Itching  ?  Other reaction(s): Other, Unknown ?Other reaction(s):  increased LFT's  ? Dextran Rash  ?  Other Reaction: Not Assessed ?Other reaction(s): Other ?Other Reaction: Not Assessed ?Other Reaction: Not Assessed ?Other Reaction: Not Assessed  ? ? ?Assessment/Plan: ? ?1. Hypertension - Clinic BP 150/58 and reported home BP 140-150/68 remains above goal < 130/80 mmHg. Instructed pt to restart irbesartan 300 mg daily. Continue carvedilol 6.25 mg BID, amlodipine 10 mg daily, and chlorthalidone 25  mg daily. Reiterated importance of bringing home cuff and BP log to next visit to validate with clinic cuff. Sent all HTN prescriptions to [facility] pharmacy, with the exception of [facility], [facility], as dose may change at next follow-up. Will f/u in 1 month to evaluate impact of carvedilol on BP with re-addition of ARB to regimen. Will prioritize titration of carvedilol if pulse remains > 60 bpm to minimize pill burden, though may consider addition of spironolactone in the future if necessary.  ? ?Patient seen with Park Liter, PY4 PharmD Candidate ? ?Megan E. Supple, PharmD, BCACP, CPP ?[id number], [place], [place] [address] ?Phone: [phone]; Fax: [phone] ?08/18/2021 11:56 AM ? ? ? ?

## 2021-08-19 ENCOUNTER — Ambulatory Visit: Payer: Medicare HMO | Admitting: Pharmacist

## 2021-08-19 VITALS — BP 150/58 | HR 68

## 2021-08-19 DIAGNOSIS — E119 Type 2 diabetes mellitus without complications: Secondary | ICD-10-CM | POA: Diagnosis not present

## 2021-08-19 DIAGNOSIS — H5340 Unspecified visual field defects: Secondary | ICD-10-CM | POA: Diagnosis not present

## 2021-08-19 DIAGNOSIS — I10 Essential (primary) hypertension: Secondary | ICD-10-CM | POA: Diagnosis not present

## 2021-08-19 DIAGNOSIS — H4720 Unspecified optic atrophy: Secondary | ICD-10-CM | POA: Diagnosis not present

## 2021-08-19 DIAGNOSIS — H401222 Low-tension glaucoma, left eye, moderate stage: Secondary | ICD-10-CM | POA: Diagnosis not present

## 2021-08-19 MED ORDER — CHLORTHALIDONE 25 MG PO TABS: 25.0000 mg | ORAL_TABLET | Freq: Every day | ORAL | 3 refills | Status: DC

## 2021-08-19 MED ORDER — IRBESARTAN 300 MG PO TABS: 300.0000 mg | ORAL_TABLET | Freq: Every day | ORAL | 3 refills | Status: DC

## 2021-08-19 MED ORDER — AMLODIPINE BESYLATE 10 MG PO TABS: 10.0000 mg | ORAL_TABLET | Freq: Every day | ORAL | 3 refills | Status: DC

## 2021-08-19 NOTE — Patient Instructions (Addendum)
It was great to see you today Joshua Vargas! ? ?Please restart your irbesartan 300 mg daily.  ?Continue taking amlodipine '10mg'$  daily, chlorthalidone '25mg'$  daily, and carvedilol 6.25 mg twice daily ?Please bring in your home blood pressure cuff and home readings to your next visit ?We sent all your blood pressure medications except carvedilol to [facility] mail order pharmacy. ? ?We will see you in one month! ? ?

## 2021-09-15 ENCOUNTER — Ambulatory Visit: Payer: Medicare HMO | Admitting: Pharmacist

## 2021-09-15 VITALS — BP 132/58 | HR 69

## 2021-09-15 DIAGNOSIS — I10 Essential (primary) hypertension: Secondary | ICD-10-CM | POA: Diagnosis not present

## 2021-09-15 NOTE — Progress Notes (Signed)
Patient ID: Joshua Vargas                 DOB: 1946/03/12                      MRN: [id number]    HPI: Joshua Vargas is a 76 y.o. male referred by Dr. Radford Pax to HTN clinic. PMH is significant for CAD with cath in 2019 showing 70% D1, 25% prox LAD occlusions medically managed, HLD, HTN, DM2, OSA with CPAP. He was last seen by Dr Radford Pax in 05/2021 where BP was 144/68 and irbesartan-HCTZ was changed to irbesartan and chlorthalidone with stable f/u BMET. He also reported a few episodes of chest pain and was scheduled for lexiscan myoview which was low risk, and reported symptoms of OSA and was set up for a sleep study. This showed mild OSA and pt was set up with CPAP.   At 06/23/21 pharmacy f/u BP was above goal at 148/62, but pt reported home BPs were generally at goal. At f/u on 07/21/21 his clinic BP was still elevated to 140/70 mmHg and pt was started on carvedilol 6.25 mg BID. Pt was seen again by PharmD on 08/19/21 where BP was elevated, however he accidentally discontinued irbesartan which was resumed.  Pt presents in good spirits today. Reports home BP readings have significantly improved since restarting irbesartan. Endorses taking all medications as prescribed on the bottle using a pill organizer to increase adherence. Pt did not remember to bring in his home BP monitor today, has forgotten the past few visits but reports after his last visit, he went home and his BP was very similar to the clinic reading we had gotten that day. Pt reports checking BP in the morning after at least one cup of coffee before his morning dose of carvedilol. BP monitoring technique seems adequate. Pt monitors BP daily. He takes amlodipine, irbesartan, carvedilol, and chlorthalidone at night time. Pt endorses a significant reduction in appetite and weight since starting Trulicity. Pt denies palpitations and headache. Endorses rare dizziness if he gets up too quickly that resolves quickly. Pt also reports fatigue, but isn't  sure that it's related to his blood pressure. Denies any hypotensive readings. Overall, pt reports tolerating antihypertensives well and is happy with his progress on BP control.  Current HTN meds: amlodipine 26m daily (PM), irbesartan 3071mdaily (PM), chlorthalidone 2520maily (PM), carvedilol 6.25 mg BID  BP goal: < 130/80 mmHg  Home BP readings:  Pt did not bring in monitor today, but reports the highest SBP was 137 mmHg and the lowest was 125 mmHg   Family History: Cancer in his father; Diabetes in his brother; Heart attack (age of onset: 51)19n his brother.   Social History: Denies alcohol, drug, and tobacco use.  Diet: Endorses 2-4 cups of coffee a day, doesn't add salt to food. Wife cooks at home. Consistently eats lean meats and veggies. Appetite has reduced with Trulicity, so portions are smaller and more infrequent. Reports snacks of fruit.  Exercise: Walks 30 minutes each day  Labs: 07/05/21 K 4.2, Scr 0.94 (on potassium chloride 10 meq daily)  Wt Readings from Last 3 Encounters:  07/07/21 190 lb 12.8 oz (86.5 kg)  06/01/21 195 lb (88.5 kg)  05/25/21 195 lb (88.5 kg)   BP Readings from Last 3 Encounters:  09/15/21 (!) 132/58  08/19/21 (!) 150/58  07/21/21 140/70   Pulse Readings from Last 3 Encounters:  09/15/21 69  08/19/21 68  07/21/21 73    Renal function: CrCl 83 mL/min (Scr 0.94 on 07/05/21) CrCl cannot be calculated (Patient's most recent lab result is older than the maximum 21 days allowed.).  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"   History of colonic polyps    Hyperlipemia    Hypertension    Knee pain    Memory loss    Paresthesia    Pneumonia 1990s X 1; ~ 2000 X 1   Prolactinoma ([place])    Type II  diabetes mellitus ([place])    Vitreous floaters of right eye     Current Outpatient Medications on File Prior to Visit  Medication Sig Dispense Refill   acetaminophen (TYLENOL) 500 MG tablet Take 500 mg by mouth as needed. pain     albuterol (VENTOLIN HFA) 108 (90 Base) MCG/ACT inhaler Inhale 1-2 puffs into the lungs every 6 (six) hours as needed for wheezing or shortness of breath. (Patient not taking: Reported on 07/07/2021) 1 each 0   amLODipine (NORVASC) 10 MG tablet Take 1 tablet (10 mg total) by mouth daily. 90 tablet 3   aspirin 81 MG EC tablet Take 81 mg by mouth daily.     augmented betamethasone dipropionate (DIPROLENE-AF) 0.05 % cream APPLY SPARINGLY TO AFFECTED AREA TWICE A DAY     Blood Glucose Monitoring Suppl (ACCU-CHEK GUIDE) w/Device KIT 1 Device by Does not apply route daily. 1 kit 0   Blood Glucose Monitoring Suppl (TRUE METRIX METER) w/Device KIT Use to check blood sugar daily 1 kit 0   cabergoline (DOSTINEX) 0.5 MG tablet SMARTSIG:1 Tablet(s) By Mouth     carvedilol (COREG) 6.25 MG tablet Take 1 tablet (6.25 mg total) by mouth 2 (two) times daily. 60 tablet 3   chlorthalidone (HYGROTON) 25 MG tablet Take 1 tablet (25 mg total) by mouth daily. 90 tablet 3   Cholecalciferol (VITAMIN D3) 10 MCG (400 UNIT) CAPS SMARTSIG:1 Capsule(s) By Mouth     ciclopirox (PENLAC) 8 % solution SMARTSIG:1 sparingly Topical Every Night     Dulaglutide (TRULICITY) 1.5 JH/4.1DE SOPN INJECT 1.5MG (1 PEN) SUBCUTANEOUSLY EVERY WEEK 8 mL 1   glimepiride (AMARYL) 2 MG tablet TAKE 1/2 TABLET EVERY DAY AT DINNER 90 tablet 0   glucose blood (ACCU-CHEK GUIDE) test strip Use as instructed 100 each 12   glucose blood (TRUE METRIX BLOOD GLUCOSE TEST) test strip Use to check blood sugar twice a day 100 each 2   irbesartan (AVAPRO) 300 MG tablet Take 1 tablet (300 mg total) by mouth daily. 90 tablet 3   metFORMIN (GLUCOPHAGE) 1000 MG tablet TAKE 1 TABLET TWICE DAILY WITH A MEAL 180 tablet 0   pioglitazone (ACTOS)  30 MG tablet TAKE 1 TABLET EVERY DAY 90 tablet 0   potassium chloride (KLOR-CON M) 10 MEQ tablet TAKE 1 TABLET EVERY DAY 90 tablet 0   pravastatin (PRAVACHOL) 40 MG tablet TAKE 1 TABLET EVERY DAY 90 tablet 0   tadalafil (CIALIS) 5 MG tablet TAKE 1 TABLET EVERY DAY AS NEEDED FOR ERECTILE DYSFUNCTION 30 tablet 0   terbinafine (LAMISIL) 250 MG tablet SMARTSIG:1 Tablet(s) By Mouth     No current facility-administered medications on file prior to visit.  Allergies  Allergen Reactions   Atorvastatin Itching    Other reaction(s): Other, Unknown Other reaction(s): increased LFT's   Dextran Rash    Other Reaction: Not Assessed Other reaction(s): Other Other Reaction: Not Assessed Other Reaction: Not Assessed Other Reaction: Not Assessed    Assessment/Plan:  1. Hypertension - BP in clinic is very close to goal of <130/80 at 132/58. Will continue amlodipine 50m daily, irbesartan 3027mdaily, chlorthalidone 2547maily, and carvedilol 6.25 mg BID since pt is tolerating current medications well and BP checks at home have been at or close to goal. Pt to bring in BP monitor to next appointment with Dr. KumDwyane Dee validate home BP monitor. Pt encouraged to continue lifestyle interventions. F/u with PharmD as needed.  Pt seen with NasReatha HarpsGY1 pharmacy resident  [place]eg[place]res[place], PharmD, BCACP, CPPMartins [id number] [id number]: [phone]: ([phone]/2023 9:25 AM

## 2021-09-15 NOTE — Patient Instructions (Addendum)
Your blood pressure looks much better today! Keep up the great work. BP in clinic today was 132/58 on recheck today. Pulse was 68. Goal BP <130/80  Please bring in blood pressure monitor to next appointment with Dr. Dwyane Dee on 09/29/21. Continue your medications.

## 2021-09-24 ENCOUNTER — Other Ambulatory Visit (INDEPENDENT_AMBULATORY_CARE_PROVIDER_SITE_OTHER): Payer: Medicare HMO

## 2021-09-24 DIAGNOSIS — E1165 Type 2 diabetes mellitus with hyperglycemia: Secondary | ICD-10-CM

## 2021-09-24 LAB — HEMOGLOBIN A1C: Hgb A1c MFr Bld: 6.1 % (ref 4.6–6.5)

## 2021-09-24 LAB — BASIC METABOLIC PANEL
BUN: 25 mg/dL — ABNORMAL HIGH (ref 6–23)
CO2: 28 mEq/L (ref 19–32)
Calcium: 9.5 mg/dL (ref 8.4–10.5)
Chloride: 101 mEq/L (ref 96–112)
Creatinine, Ser: 1.37 mg/dL (ref 0.40–1.50)
GFR: 50.5 mL/min — ABNORMAL LOW (ref >60.00)
Glucose, Bld: 77 mg/dL (ref 70–99)
Potassium: 3.5 mEq/L (ref 3.5–5.1)
Sodium: 139 mEq/L (ref 135–145)

## 2021-09-24 LAB — MICROALBUMIN / CREATININE URINE RATIO
Creatinine,U: 205.7 mg/dL
Microalb Creat Ratio: 1.2 mg/g (ref 0.0–30.0)
Microalb, Ur: 2.5 mg/dL — ABNORMAL HIGH (ref 0.0–1.9)

## 2021-09-29 ENCOUNTER — Encounter: Payer: Self-pay | Admitting: Endocrinology

## 2021-09-29 ENCOUNTER — Ambulatory Visit: Payer: Medicare HMO | Admitting: Endocrinology

## 2021-09-29 VITALS — BP 114/62 | HR 66 | Ht 70.0 in | Wt 192.4 lb

## 2021-09-29 DIAGNOSIS — I10 Essential (primary) hypertension: Secondary | ICD-10-CM | POA: Diagnosis not present

## 2021-09-29 DIAGNOSIS — E119 Type 2 diabetes mellitus without complications: Secondary | ICD-10-CM | POA: Diagnosis not present

## 2021-09-29 DIAGNOSIS — N289 Disorder of kidney and ureter, unspecified: Secondary | ICD-10-CM | POA: Diagnosis not present

## 2021-09-29 DIAGNOSIS — E782 Mixed hyperlipidemia: Secondary | ICD-10-CM | POA: Diagnosis not present

## 2021-09-29 NOTE — Patient Instructions (Addendum)
Stop GLIMEPERIDE   Check blood sugars on waking up 2-3 days a week  Also check blood sugars about 2 hours after meals and do this after different meals by rotation  Recommended blood sugar levels on waking up are 80-130 and about 2 hours after meal is 130-160  Please bring your blood sugar monitor to each visit, thank you  Chlorthalidone 1/2 daily or 1 every 2 days

## 2021-09-29 NOTE — Progress Notes (Signed)
Patient ID: Joshua Vargas, male   DOB: [birth year], 76 y.o.   MRN: [id number]    Reason for Appointment: follow-up of various  problems  History of Present Illness   Type 2 DIABETES MELITUS, date of diagnosis: 2002      Previous history: He was initially treated with metformin alone and then subsequently was also given Actos and Amaryl.  He has had fairly good control but somewhat inconsistent because of difficulties with compliance with diet and exercise periodically. A1c previously has fluctuated between about 6.7-7.6 In 6/15 he was started on Victoza which he wanted to try because of difficulty losing weight and better control  Recent history:   Non-insulin hypoglycemic drugs: Actoplusmet 15/850 once a day, Amaryl 1 mg in evening, Trulicity 1.5 mg weekly           His A1c is back down to 6.1  Current management, blood sugar patterns and problems identified: He now has been able to get his Trulicity more regularly than before and has also tolerated going up to the 1.5 mg dose weekly  With this he has had better satiety and although he has not lost any weight his blood sugars are significantly better  As before he likely checking blood sugars only sporadically and either in the morning or before dinner but not after meals  He does not think he has any hypoglycemia although he is not aware of what normal blood sugar ranges are  Lab fasting glucose was 77 without symptoms  He was also told to cut back on his Amaryl and is taking only 1 mg at dinnertime  Overall diet has been fairly good He says he is trying to walk 20 to 40 minutes     Side effects from medications:  anorexia with 1.2 mg Victoza       Monitors blood glucose: Sporadically   Glucometer:  One Touch.           Blood Glucose readings at home recent range by recall: 78-120                Dietician visit: Most recent: 06/2016            Wt Readings from Last 3 Encounters:  09/29/21 192 lb 6.4 oz (87.3  kg)  07/07/21 190 lb 12.8 oz (86.5 kg)  06/01/21 195 lb (88.5 kg)   LABS:  Lab Results  Component Value Date   HGBA1C 6.1 09/24/2021   HGBA1C 7.6 (H) 07/05/2021   HGBA1C 6.2 11/18/2020   Lab Results  Component Value Date   MICROALBUR 2.5 (H) 09/24/2021   LDLCALC 70 07/05/2021   CREATININE 1.37 09/24/2021     OTHER problems are discussed in review of systems   LABS:  Lab on 09/24/2021  Component Date Value Ref Range Status   Microalb, Ur 09/24/2021 2.5 (H)  0.0 - 1.9 mg/dL Final   Creatinine,U 09/24/2021 205.7  mg/dL Final   Microalb Creat Ratio 09/24/2021 1.2  0.0 - 30.0 mg/g Final   Sodium 09/24/2021 139  135 - 145 mEq/L Final   Potassium 09/24/2021 3.5  3.5 - 5.1 mEq/L Final   Chloride 09/24/2021 101  96 - 112 mEq/L Final   CO2 09/24/2021 28  19 - 32 mEq/L Final   Glucose, Bld 09/24/2021 77  70 - 99 mg/dL Final   BUN 09/24/2021 25 (H)  6 - 23 mg/dL Final   Creatinine, Ser 09/24/2021 1.37  0.40 - 1.50 mg/dL Final   GFR  09/24/2021 50.50 (L)  >60.00 mL/min Final   Calculated using the CKD-EPI Creatinine Equation (2021)   Calcium 09/24/2021 9.5  8.4 - 10.5 mg/dL Final   Hgb A1c MFr Bld 09/24/2021 6.1  4.6 - 6.5 % Final   Glycemic Control Guidelines for People with Diabetes:Non Diabetic:  <6%Goal of Therapy: <7%Additional Action Suggested:  >8%     Allergies as of 09/29/2021       Reactions   Atorvastatin Itching   Other reaction(s): Other, Unknown Other reaction(s): increased LFT's   Dextran Rash   Other Reaction: Not Assessed Other reaction(s): Other Other Reaction: Not Assessed Other Reaction: Not Assessed Other Reaction: Not Assessed        Medication List        Accurate as of September 29, 2021  8:35 PM. If you have any questions, ask your nurse or doctor.          Accu-Chek Guide test strip Generic drug: glucose blood Use as instructed   True Metrix Blood Glucose Test test strip Generic drug: glucose blood Use to check blood sugar twice a day    Accu-Chek Guide w/Device Kit 1 Device by Does not apply route daily.   True Metrix Meter w/Device Kit Use to check blood sugar daily   acetaminophen 500 MG tablet Commonly known as: TYLENOL Take 500 mg by mouth as needed. pain   albuterol 108 (90 Base) MCG/ACT inhaler Commonly known as: VENTOLIN HFA Inhale 1-2 puffs into the lungs every 6 (six) hours as needed for wheezing or shortness of breath.   amLODipine 10 MG tablet Commonly known as: NORVASC Take 1 tablet (10 mg total) by mouth daily.   aspirin EC 81 MG tablet Take 81 mg by mouth daily.   augmented betamethasone dipropionate 0.05 % cream Commonly known as: DIPROLENE-AF APPLY SPARINGLY TO AFFECTED AREA TWICE A DAY   cabergoline 0.5 MG tablet Commonly known as: DOSTINEX SMARTSIG:1 Tablet(s) By Mouth   carvedilol 6.25 MG tablet Commonly known as: COREG Take 1 tablet (6.25 mg total) by mouth 2 (two) times daily.   chlorthalidone 25 MG tablet Commonly known as: HYGROTON Take 1 tablet (25 mg total) by mouth daily.   ciclopirox 8 % solution Commonly known as: PENLAC SMARTSIG:1 sparingly Topical Every Night   glimepiride 2 MG tablet Commonly known as: AMARYL TAKE 1/2 TABLET EVERY DAY AT DINNER   irbesartan 300 MG tablet Commonly known as: AVAPRO Take 1 tablet (300 mg total) by mouth daily.   latanoprost 0.005 % ophthalmic solution Commonly known as: XALATAN SMARTSIG:In Eye(s)   metFORMIN 1000 MG tablet Commonly known as: GLUCOPHAGE TAKE 1 TABLET TWICE DAILY WITH A MEAL   pioglitazone 30 MG tablet Commonly known as: ACTOS TAKE 1 TABLET EVERY DAY   potassium chloride 10 MEQ tablet Commonly known as: KLOR-CON M TAKE 1 TABLET EVERY DAY   pravastatin 40 MG tablet Commonly known as: PRAVACHOL TAKE 1 TABLET EVERY DAY   tadalafil 5 MG tablet Commonly known as: CIALIS TAKE 1 TABLET EVERY DAY AS NEEDED FOR ERECTILE DYSFUNCTION   terbinafine 250 MG tablet Commonly known as: LAMISIL SMARTSIG:1 Tablet(s)  By Mouth   Trulicity 1.5 RJ/7.3GK Sopn Generic drug: Dulaglutide INJECT 1.5MG (1 PEN) SUBCUTANEOUSLY EVERY WEEK   Vitamin D3 10 MCG (400 UNIT) Caps SMARTSIG:1 Capsule(s) By Mouth        Allergies:  Allergies  Allergen Reactions   Atorvastatin Itching    Other reaction(s): Other, Unknown Other reaction(s): increased LFT's   Dextran Rash  Other Reaction: Not Assessed Other reaction(s): Other Other Reaction: Not Assessed Other Reaction: Not Assessed Other Reaction: Not Assessed    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"   History of colonic polyps    Hyperlipemia    Hypertension    Knee pain    Memory loss    Paresthesia    Pneumonia 1990s X 1; ~ 2000 X 1   Prolactinoma ([place])    Type II diabetes mellitus ([place])    Vitreous floaters of right eye     Past Surgical History:  Procedure Laterality Date   CARDIAC CATHETERIZATION  ?2006; 01/25/2018   ELBOW SURGERY Left    "something to do w/the muscle"   JOINT REPLACEMENT     LEFT HEART CATH AND CORONARY ANGIOGRAPHY N/A 01/26/2018   Procedure: LEFT HEART CATH AND CORONARY ANGIOGRAPHY;  Gionfriddo: Martin[place], Peter M, MD;  Location: [facility];  Service: Cardiovascular;  Laterality: N/A;   PICC LINE INSERTION  ~ 2005   "took it out months later"   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 He has never used smokeless tobacco. He reports that he does not drink alcohol and does not use drugs.  Review of Systems:   Hypertension:   He has been on a regimen of Avapro 300 and Norvasc 10 mg CTZ  BP at home has been around 120 and sometimes less systolic   BP Readings from Last 3 Encounters:  09/29/21 132/64  09/15/21 (!) 132/58  08/19/21 (!) 150/58   Renal function: Lab Results  Component Value Date   CREATININE 1.37 09/24/2021   CREATININE 0.94 07/05/2021   CREATININE 1.10 06/01/2021     Potassium is low normal with taking the 10 mEq supplement  Lab Results  Component Value Date   K 3.5 09/24/2021     Lipids: He had been on Crestor and simvastatin and these were stopped because of increased liver functions Because of significantly high lipids he has been on 40 mg pravastatin  LDL has been well controlled and below 70 consistently   Lab Results  Component Value Date   CHOL 156 07/05/2021   CHOL 160 08/18/2020   CHOL 151 12/17/2019   Lab Results  Component Value Date   HDL 55.00 07/05/2021   HDL 65.30 08/18/2020   HDL 56.90 12/17/2019   Lab Results  Component Value Date   LDLCALC 70 07/05/2021   LDLCALC 68 08/18/2020   LDLCALC 69 12/17/2019   Lab Results  Component Value Date   TRIG 153.0 (H) 07/05/2021   TRIG 135.0 08/18/2020   TRIG 126.0 12/17/2019   Lab  Results  Component Value Date   CHOLHDL 3 07/05/2021   CHOLHDL 2 08/18/2020   CHOLHDL 3 12/17/2019   Lab Results  Component Value Date   LDLDIRECT 120.0 05/09/2018   LDLDIRECT 121.0 11/28/2017   LDLDIRECT 106.0 10/03/2017      Abnormal liver functions:  He has had mild increase in liver functions, possibly from fatty liver although his ultrasound was normal in 2011.  No recent history of excessive alcohol May possibly have had high ALT from some statins in the past  ALT has been normal consistently   Lab Results  Component Value  Date   ALT 13 07/05/2021     He has had a 4x 5-mm  prolactinoma since 2007 with baseline prolactin level of 101, treated with Dostinex .  He has been taken off his medication, previously because of his noncompliance with this Now prolactin consistently normal even without cabergoline  Testosterone level has been normal with normalization of prolactin  Lab Results  Component Value Date   PROLACTIN 13.0 07/05/2021   PROLACTIN 9.0 08/18/2020   PROLACTIN 7.1 03/19/2020   PROLACTIN 7.9 12/17/2019   PROLACTIN 5.4 03/20/2019      Lab Results  Component Value Date   TESTOSTERONE 396.03 07/05/2021   Erectile dysfunction treated with Cialis     Examination:   BP 132/64   Pulse 66   Ht 5' 10" (1.778 m)   Wt 192 lb 6.4 oz (87.3 kg)   SpO2 96%   BMI 27.61 kg/m   Body mass index is 27.61 kg/m.    ASSESSMENT/ PLAN:   Diabetes type 2 with mild obesity  See history of present illness for detailed discussion of current diabetes management, blood sugar patterns and problems identified  His A1c is excellent at 6.1  With taking Trulicity regularly his blood sugars are markedly improved, weight is stable He is slightly not exercising enough and can do better Also monitoring at home is inadequate  Since he has blood sugars are as low as 77 fasting he can leave off his Amaryl for now to avoid hypoglycemia considering his age and A1c being relatively low He is to bring his monitor for review and also check readings after meals more consistently  Hypertension: blood pressure is relatively lower especially standing Also with his creatinine going up likely is getting prerenal azotemia from getting 25 mg chlorthalidone  He will take chlorthalidone every other day or try to cut it in half Message sent to his cardiology pharmacist also  Hyperprolactinemia: Longstanding, previously treated with cabergoline Prolactin and testosterone to be followed periodically   LIPIDS:  Well-controlled on pravastatin  Urine microalbumin normal  Follow-up in 3 months  Patient Instructions  Stop GLIMEPERIDE   Check blood sugars on waking up 2-3 days a week  Also check blood sugars about 2 hours after meals and do this after different meals by rotation  Recommended blood sugar levels on waking up are 80-130 and about 2 hours after meal is 130-160  Please bring your blood sugar monitor to each visit, thank you  Chlorthalidone 1/2 daily or 1 every 2 days   Elayne Snare 09/29/2021, 8:35 PM

## 2021-10-15 ENCOUNTER — Encounter: Payer: Self-pay | Admitting: Podiatry

## 2021-10-15 ENCOUNTER — Ambulatory Visit: Payer: Medicare HMO | Admitting: Podiatry

## 2021-10-15 DIAGNOSIS — M79674 Pain in right toe(s): Secondary | ICD-10-CM | POA: Diagnosis not present

## 2021-10-15 DIAGNOSIS — E1140 Type 2 diabetes mellitus with diabetic neuropathy, unspecified: Secondary | ICD-10-CM

## 2021-10-15 DIAGNOSIS — B351 Tinea unguium: Secondary | ICD-10-CM

## 2021-10-15 DIAGNOSIS — M79675 Pain in left toe(s): Secondary | ICD-10-CM | POA: Diagnosis not present

## 2021-10-21 ENCOUNTER — Other Ambulatory Visit: Payer: Self-pay | Admitting: Cardiology

## 2021-10-22 NOTE — Progress Notes (Signed)
  Subjective:  Patient ID: Joshua Vargas, male    DOB: 09-18-1945,  MRN: [id number]  Joshua Vargas presents to clinic today for preventative diabetic foot care and painful thick toenails that are difficult to trim. Pain interferes with ambulation. Aggravating factors include wearing enclosed shoe gear. Pain is relieved with periodic professional debridement.  Last A1c was 6.5%. Patient did not check blood glucose today..  New problem(s): None.   PCP is Antony Contras, MD , and last visit was January, 2023.  Allergies  Allergen Reactions   Atorvastatin Itching    Other reaction(s): Other, Unknown Other reaction(s): increased LFT's   Dextran Rash    Other Reaction: Not Assessed Other reaction(s): Other Other Reaction: Not Assessed Other Reaction: Not Assessed Other Reaction: Not Assessed    Review of Systems: Negative except as noted in the HPI.  Objective: No changes noted in today's physical examination. Vascular Examination: Vascular status intact b/l with palpable pedal pulses. Pedal hair present b/l. CFT immediate b/l. No edema. No pain with calf compression b/l. Skin temperature gradient WNL b/l.   Neurological Examination: Sensation grossly intact b/l with 10 gram monofilament. Vibratory sensation intact b/l.   Dermatological Examination: Pedal skin with normal turgor, texture and tone b/l. Toenails 1-5 b/l thick, discolored, elongated with subungual debris and pain on dorsal palpation. No hyperkeratotic lesions noted b/l.   Musculoskeletal Examination: Muscle strength 5/5 to b/l LE. Limited joint ROM to the bilateral ankles.  Radiographs: None    Latest Ref Rng & Units 09/24/2021    8:32 AM 07/05/2021    8:35 AM 11/18/2020    8:08 AM  Hemoglobin A1C  Hemoglobin-A1c 4.6 - 6.5 % 6.1  7.6  6.2    Assessment/Plan: 1. Pain due to onychomycosis of toenails of both feet   2. Type 2 diabetes mellitus with diabetic neuropathy, unspecified whether long term insulin use  ([place])      -Consent given for treatment as described below: -Continue foot and shoe inspections daily. Monitor blood glucose per PCP/Endocrinologist's recommendations. -Patient to continue soft, supportive shoe gear daily. -Toenails 1-5 b/l were debrided in length and girth with sterile nail nippers and dremel without iatrogenic bleeding.  -Patient/POA to call should there be question/concern in the interim.   Return in about 3 months (around 01/15/2022).  Marzetta Board, DPM

## 2021-11-03 ENCOUNTER — Other Ambulatory Visit: Payer: Self-pay | Admitting: Endocrinology

## 2021-11-30 DIAGNOSIS — I10 Essential (primary) hypertension: Secondary | ICD-10-CM | POA: Diagnosis not present

## 2021-11-30 DIAGNOSIS — G4730 Sleep apnea, unspecified: Secondary | ICD-10-CM | POA: Diagnosis not present

## 2021-11-30 DIAGNOSIS — E1169 Type 2 diabetes mellitus with other specified complication: Secondary | ICD-10-CM | POA: Diagnosis not present

## 2021-11-30 DIAGNOSIS — Z7984 Long term (current) use of oral hypoglycemic drugs: Secondary | ICD-10-CM | POA: Diagnosis not present

## 2021-11-30 DIAGNOSIS — R413 Other amnesia: Secondary | ICD-10-CM | POA: Diagnosis not present

## 2021-11-30 DIAGNOSIS — D352 Benign neoplasm of pituitary gland: Secondary | ICD-10-CM | POA: Diagnosis not present

## 2021-11-30 DIAGNOSIS — E7800 Pure hypercholesterolemia, unspecified: Secondary | ICD-10-CM | POA: Diagnosis not present

## 2021-11-30 DIAGNOSIS — Z23 Encounter for immunization: Secondary | ICD-10-CM | POA: Diagnosis not present

## 2021-11-30 DIAGNOSIS — Z7985 Long-term (current) use of injectable non-insulin antidiabetic drugs: Secondary | ICD-10-CM | POA: Diagnosis not present

## 2021-12-15 DIAGNOSIS — L259 Unspecified contact dermatitis, unspecified cause: Secondary | ICD-10-CM | POA: Diagnosis not present

## 2021-12-16 DIAGNOSIS — H401231 Low-tension glaucoma, bilateral, mild stage: Secondary | ICD-10-CM | POA: Diagnosis not present

## 2021-12-16 DIAGNOSIS — H02831 Dermatochalasis of right upper eyelid: Secondary | ICD-10-CM | POA: Diagnosis not present

## 2021-12-16 DIAGNOSIS — H02834 Dermatochalasis of left upper eyelid: Secondary | ICD-10-CM | POA: Diagnosis not present

## 2021-12-16 DIAGNOSIS — H4720 Unspecified optic atrophy: Secondary | ICD-10-CM | POA: Diagnosis not present

## 2021-12-16 DIAGNOSIS — E119 Type 2 diabetes mellitus without complications: Secondary | ICD-10-CM | POA: Diagnosis not present

## 2021-12-18 DIAGNOSIS — Z0100 Encounter for examination of eyes and vision without abnormal findings: Secondary | ICD-10-CM | POA: Diagnosis not present

## 2021-12-25 NOTE — Telephone Encounter (Signed)
no contact for cpap set up  Sheran Lawless, Marica Otter, MD; Freada Bergeron, CMA; Drue Novel I, RN; Reola Calkins, Leory Plowman  Good morning,   We have made multiple attempts to schedule this patient for set up with no success.  He advised earlier in the year that he would like to reschedule his appt but never did.  Please let us know if you are e able to reach him.  thanks  [facility]

## 2022-01-27 DIAGNOSIS — R350 Frequency of micturition: Secondary | ICD-10-CM | POA: Diagnosis not present

## 2022-01-27 DIAGNOSIS — R3912 Poor urinary stream: Secondary | ICD-10-CM | POA: Diagnosis not present

## 2022-01-27 DIAGNOSIS — R351 Nocturia: Secondary | ICD-10-CM | POA: Diagnosis not present

## 2022-01-27 DIAGNOSIS — N401 Enlarged prostate with lower urinary tract symptoms: Secondary | ICD-10-CM | POA: Diagnosis not present

## 2022-01-27 DIAGNOSIS — N5201 Erectile dysfunction due to arterial insufficiency: Secondary | ICD-10-CM | POA: Diagnosis not present

## 2022-01-27 DIAGNOSIS — R3915 Urgency of urination: Secondary | ICD-10-CM | POA: Diagnosis not present

## 2022-01-28 ENCOUNTER — Encounter: Payer: Self-pay | Admitting: Podiatry

## 2022-01-28 ENCOUNTER — Ambulatory Visit: Payer: Medicare HMO | Admitting: Podiatry

## 2022-01-28 DIAGNOSIS — M79675 Pain in left toe(s): Secondary | ICD-10-CM | POA: Diagnosis not present

## 2022-01-28 DIAGNOSIS — M79674 Pain in right toe(s): Secondary | ICD-10-CM

## 2022-01-28 DIAGNOSIS — B351 Tinea unguium: Secondary | ICD-10-CM

## 2022-01-28 DIAGNOSIS — E1140 Type 2 diabetes mellitus with diabetic neuropathy, unspecified: Secondary | ICD-10-CM | POA: Diagnosis not present

## 2022-01-28 DIAGNOSIS — Z23 Encounter for immunization: Secondary | ICD-10-CM | POA: Diagnosis not present

## 2022-01-28 NOTE — Progress Notes (Unsigned)
  Subjective:  Patient ID: Joshua Vargas, male    DOB: 01-12-1946,  MRN: [id number]  Joshua Vargas presents to clinic today for:  Chief Complaint  Patient presents with   Nail Problem    Diabetic foot care BS-did not check A1C-6.5 PCP-David Swayne PCP VST-6 months ago   New problem(s): None.   PCP is Antony Contras, MD.  Allergies  Allergen Reactions   Atorvastatin Itching    Other reaction(s): Other, Unknown Other reaction(s): increased LFT's   Dextran Rash    Other Reaction: Not Assessed Other reaction(s): Other Other Reaction: Not Assessed Other Reaction: Not Assessed Other Reaction: Not Assessed    Review of Systems: Negative except as noted in the HPI.  Objective: No changes noted in today's physical examination.  Joshua Vargas is a pleasant 76 y.o. male {[id number]} AAO x 3.  Vascular Examination: Vascular status intact b/l with palpable pedal pulses. Pedal hair present b/l. CFT immediate b/l. No edema. No pain with calf compression b/l. Skin temperature gradient WNL b/l.   Neurological Examination: Sensation grossly intact b/l with 10 gram monofilament. Vibratory sensation intact b/l.   Dermatological Examination: Pedal skin with normal turgor, texture and tone b/l. Toenails 1-5 b/l thick, discolored, elongated with subungual debris and pain on dorsal palpation. No hyperkeratotic lesions noted b/l.   Musculoskeletal Examination: Muscle strength 5/5 to b/l LE. Limited joint ROM to the bilateral ankles.  Radiographs: None Assessment/Plan: 1. Pain due to onychomycosis of toenails of both feet   2. Type 2 diabetes mellitus with diabetic neuropathy, unspecified whether long term insulin use ([place])     No orders of the defined types were placed in this encounter.   {Jgplan:[id number]::"-Patient/POA to call should there be question/concern in the interim."}   No follow-ups on file.  Marzetta Board, DPM

## 2022-02-01 ENCOUNTER — Other Ambulatory Visit (INDEPENDENT_AMBULATORY_CARE_PROVIDER_SITE_OTHER): Payer: Medicare HMO

## 2022-02-01 DIAGNOSIS — E782 Mixed hyperlipidemia: Secondary | ICD-10-CM

## 2022-02-01 DIAGNOSIS — E119 Type 2 diabetes mellitus without complications: Secondary | ICD-10-CM | POA: Diagnosis not present

## 2022-02-01 LAB — LIPID PANEL
Cholesterol: 150 mg/dL (ref 0–200)
HDL: 58.4 mg/dL (ref >39.00)
LDL Cholesterol: 57 mg/dL (ref 0–99)
NonHDL: 91.21
Total CHOL/HDL Ratio: 3
Triglycerides: 171 mg/dL — ABNORMAL HIGH (ref 0.0–149.0)
VLDL: 34.2 mg/dL (ref 0.0–40.0)

## 2022-02-01 LAB — HEMOGLOBIN A1C: Hgb A1c MFr Bld: 6.5 % (ref 4.6–6.5)

## 2022-02-01 LAB — COMPREHENSIVE METABOLIC PANEL
ALT: 19 U/L (ref 0–53)
AST: 24 U/L (ref 0–37)
Albumin: 4.1 g/dL (ref 3.5–5.2)
Alkaline Phosphatase: 52 U/L (ref 39–117)
BUN: 15 mg/dL (ref 6–23)
CO2: 30 mEq/L (ref 19–32)
Calcium: 9.8 mg/dL (ref 8.4–10.5)
Chloride: 100 mEq/L (ref 96–112)
Creatinine, Ser: 1.02 mg/dL (ref 0.40–1.50)
GFR: 71.77 mL/min (ref >60.00)
Glucose, Bld: 111 mg/dL — ABNORMAL HIGH (ref 70–99)
Potassium: 3.9 mEq/L (ref 3.5–5.1)
Sodium: 138 mEq/L (ref 135–145)
Total Bilirubin: 0.5 mg/dL (ref 0.2–1.2)
Total Protein: 7.7 g/dL (ref 6.0–8.3)

## 2022-02-02 NOTE — Progress Notes (Unsigned)
Patient ID: Joshua Vargas, male   DOB: [birth year], 76 y.o.   MRN: [id number]    Reason for Appointment: follow-up of various  problems  History of Present Illness   Type 2 DIABETES MELITUS, date of diagnosis: 2002      Previous history: He was initially treated with metformin alone and then subsequently was also given Actos and Amaryl.  He has had fairly good control but somewhat inconsistent because of difficulties with compliance with diet and exercise periodically. A1c previously has fluctuated between about 6.7-7.6 In 6/15 he was started on Victoza which he wanted to try because of difficulty losing weight and better control  Recent history:   Non-insulin hypoglycemic drugs: Actoplusmet 15/850 once a day, Amaryl 1 mg in evening, Trulicity 1.5 mg weekly           His A1c is back down to 6.1  Current management, blood sugar patterns and problems identified: He now has been able to get his Trulicity more regularly than before and has also tolerated going up to the 1.5 mg dose weekly  With this he has had better satiety and although he has not lost any weight his blood sugars are significantly better  As before he likely checking blood sugars only sporadically and either in the morning or before dinner but not after meals  He does not think he has any hypoglycemia although he is not aware of what normal blood sugar ranges are  Lab fasting glucose was 77 without symptoms  He was also told to cut back on his Amaryl and is taking only 1 mg at dinnertime  Overall diet has been fairly good He says he is trying to walk 20 to 40 minutes     Side effects from medications:  anorexia with 1.2 mg Victoza       Monitors blood glucose: Sporadically   Glucometer:  One Touch.           Blood Glucose readings at home recent range by recall: 78-120                Dietician visit: Most recent: 06/2016            Wt Readings from Last 3 Encounters:  09/29/21 192 lb 6.4 oz (87.3  kg)  07/07/21 190 lb 12.8 oz (86.5 kg)  06/01/21 195 lb (88.5 kg)   LABS:  Lab Results  Component Value Date   HGBA1C 6.5 02/01/2022   HGBA1C 6.1 09/24/2021   HGBA1C 7.6 (H) 07/05/2021   Lab Results  Component Value Date   MICROALBUR 2.5 (H) 09/24/2021   LDLCALC 57 02/01/2022   CREATININE 1.02 02/01/2022     OTHER problems are discussed in review of systems   LABS:  Lab on 02/01/2022  Component Date Value Ref Range Status   Cholesterol 02/01/2022 150  0 - 200 mg/dL Final   ATP III Classification       Desirable:  < 200 mg/dL               Borderline High:  200 - 239 mg/dL          High:  > = 240 mg/dL   Triglycerides 02/01/2022 171.0 (H)  0.0 - 149.0 mg/dL Final   Normal:  <150 mg/dLBorderline High:  150 - 199 mg/dL   HDL 02/01/2022 58.40  >39.00 mg/dL Final   VLDL 02/01/2022 34.2  0.0 - 40.0 mg/dL Final   LDL Cholesterol 02/01/2022 57  0 - 99  mg/dL Final   Total CHOL/HDL Ratio 02/01/2022 3   Final                  Men          Women1/2 Average Risk     3.4          3.3Average Risk          5.0          4.42X Average Risk          9.6          7.13X Average Risk          15.0          11.0                       NonHDL 02/01/2022 91.21   Final   NOTE:  Non-HDL goal should be 30 mg/dL higher than patient's LDL goal (i.e. LDL goal of < 70 mg/dL, would have non-HDL goal of < 100 mg/dL)   Sodium 02/01/2022 138  135 - 145 mEq/L Final   Potassium 02/01/2022 3.9  3.5 - 5.1 mEq/L Final   Chloride 02/01/2022 100  96 - 112 mEq/L Final   CO2 02/01/2022 30  19 - 32 mEq/L Final   Glucose, Bld 02/01/2022 111 (H)  70 - 99 mg/dL Final   BUN 02/01/2022 15  6 - 23 mg/dL Final   Creatinine, Ser 02/01/2022 1.02  0.40 - 1.50 mg/dL Final   Total Bilirubin 02/01/2022 0.5  0.2 - 1.2 mg/dL Final   Alkaline Phosphatase 02/01/2022 52  39 - 117 U/L Final   AST 02/01/2022 24  0 - 37 U/L Final   ALT 02/01/2022 19  0 - 53 U/L Final   Total Protein 02/01/2022 7.7  6.0 - 8.3 g/dL Final   Albumin  02/01/2022 4.1  3.5 - 5.2 g/dL Final   GFR 02/01/2022 71.77  >60.00 mL/min Final   Calculated using the CKD-EPI Creatinine Equation (2021)   Calcium 02/01/2022 9.8  8.4 - 10.5 mg/dL Final   Hgb A1c MFr Bld 02/01/2022 6.5  4.6 - 6.5 % Final   Glycemic Control Guidelines for People with Diabetes:Non Diabetic:  <6%Goal of Therapy: <7%Additional Action Suggested:  >8%     Allergies as of 02/03/2022       Reactions   Atorvastatin Itching   Other reaction(s): Other, Unknown Other reaction(s): increased LFT's   Dextran Rash   Other Reaction: Not Assessed Other reaction(s): Other Other Reaction: Not Assessed Other Reaction: Not Assessed Other Reaction: Not Assessed        Medication List        Accurate as of February 02, 2022  1:33 PM. If you have any questions, ask your nurse or doctor.          Accu-Chek Guide test strip Generic drug: glucose blood Use as instructed   True Metrix Blood Glucose Test test strip Generic drug: glucose blood Use to check blood sugar twice a day   Accu-Chek Guide w/Device Kit 1 Device by Does not apply route daily.   True Metrix Meter w/Device Kit Use to check blood sugar daily   acetaminophen 500 MG tablet Commonly known as: TYLENOL Take 500 mg by mouth as needed. pain   albuterol 108 (90 Base) MCG/ACT inhaler Commonly known as: VENTOLIN HFA Inhale 1-2 puffs into the lungs every 6 (six) hours as needed for wheezing or shortness of breath.  amLODipine 10 MG tablet Commonly known as: NORVASC Take 1 tablet (10 mg total) by mouth daily.   aspirin EC 81 MG tablet Take 81 mg by mouth daily.   augmented betamethasone dipropionate 0.05 % cream Commonly known as: DIPROLENE-AF APPLY SPARINGLY TO AFFECTED AREA TWICE A DAY   cabergoline 0.5 MG tablet Commonly known as: DOSTINEX SMARTSIG:1 Tablet(s) By Mouth   carvedilol 6.25 MG tablet Commonly known as: COREG TAKE 1 TABLET BY MOUTH TWICE A DAY   chlorthalidone 25 MG  tablet Commonly known as: HYGROTON Take 1 tablet (25 mg total) by mouth daily.   ciclopirox 8 % solution Commonly known as: PENLAC SMARTSIG:1 sparingly Topical Every Night   Gemtesa 75 MG Tabs Generic drug: Vibegron Take 1 tablet by mouth daily.   glimepiride 2 MG tablet Commonly known as: AMARYL TAKE 1/2 TABLET EVERY DAY AT DINNER   irbesartan 300 MG tablet Commonly known as: AVAPRO Take 1 tablet (300 mg total) by mouth daily.   latanoprost 0.005 % ophthalmic solution Commonly known as: XALATAN SMARTSIG:In Eye(s)   metFORMIN 1000 MG tablet Commonly known as: GLUCOPHAGE TAKE 1 TABLET TWICE DAILY WITH MEALS   pioglitazone 30 MG tablet Commonly known as: ACTOS TAKE 1 TABLET EVERY DAY   potassium chloride 10 MEQ tablet Commonly known as: KLOR-CON M TAKE 1 TABLET EVERY DAY   pravastatin 40 MG tablet Commonly known as: PRAVACHOL TAKE 1 TABLET EVERY DAY   tadalafil 5 MG tablet Commonly known as: CIALIS TAKE 1 TABLET EVERY DAY AS NEEDED FOR ERECTILE DYSFUNCTION   tamsulosin 0.4 MG Caps capsule Commonly known as: FLOMAX Take 0.4 mg by mouth daily.   terbinafine 250 MG tablet Commonly known as: LAMISIL SMARTSIG:1 Tablet(s) By Mouth   Trulicity 1.5 LT/9.0ZE Sopn Generic drug: Dulaglutide INJECT 1.5MG (1 PEN) SUBCUTANEOUSLY EVERY WEEK   Vitamin D3 10 MCG (400 UNIT) Caps SMARTSIG:1 Capsule(s) By Mouth        Allergies:  Allergies  Allergen Reactions   Atorvastatin Itching    Other reaction(s): Other, Unknown Other reaction(s): increased LFT's   Dextran Rash    Other Reaction: Not Assessed Other reaction(s): Other Other Reaction: Not Assessed Other Reaction: Not Assessed Other Reaction: Not Assessed    Past Medical History:  Diagnosis Date   BEP (benign enlargement of prostate)    BPH (benign prostatic hyperplasia)    Constipation    Coronary artery disease    Elevated LDL cholesterol level    Family history of pancreatic cancer    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"   History of colonic polyps    Hyperlipemia    Hypertension    Knee pain    Memory loss    Paresthesia    Pneumonia 1990s X 1; ~ 2000 X 1   Prolactinoma ([place])    Type II diabetes mellitus ([place])    Vitreous floaters of right eye     Past Surgical History:  Procedure Laterality Date   CARDIAC CATHETERIZATION  ?2006; 01/25/2018   ELBOW SURGERY Left    "something to do w/the muscle"   JOINT REPLACEMENT     LEFT HEART CATH AND CORONARY ANGIOGRAPHY N/A 01/26/2018   Procedure: LEFT HEART CATH AND CORONARY ANGIOGRAPHY;  [place]: [place], Peter M, MD;  Location: [facility];  Service: Cardiovascular;  Laterality: N/A;   PICC LINE INSERTION  ~ 2005   "took it out months later"   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 He has never used smokeless tobacco. He reports that he does not drink alcohol and does not use drugs.  Review of Systems:   Hypertension:   He has been on a regimen of Avapro 300 and Norvasc 10 mg CTZ  BP at home has been around 120 and sometimes less systolic   BP Readings from Last 3 Encounters:  09/29/21 114/62  09/15/21 (!) 132/58  08/19/21 (!) 150/58   Renal function: Lab Results  Component Value Date   CREATININE 1.02 02/01/2022   CREATININE 1.37 09/24/2021   CREATININE 0.94 07/05/2021     Potassium is low normal with taking the 10 mEq supplement  Lab Results  Component Value Date   K 3.9 02/01/2022     Lipids: He had been on Crestor and simvastatin and these were stopped  because of increased liver functions Because of significantly high lipids he has been on 40 mg pravastatin  LDL has been well controlled and below 70 consistently   Lab Results  Component Value Date   CHOL 150 02/01/2022   CHOL 156 07/05/2021   CHOL 160 08/18/2020   Lab Results  Component Value Date   HDL 58.40 02/01/2022   HDL 55.00 07/05/2021   HDL 65.30 08/18/2020   Lab Results  Component Value Date   LDLCALC 57 02/01/2022   LDLCALC 70 07/05/2021   LDLCALC 68 08/18/2020   Lab Results  Component Value Date   TRIG 171.0 (H) 02/01/2022   TRIG 153.0 (H) 07/05/2021   TRIG 135.0 08/18/2020   Lab Results  Component Value Date   CHOLHDL 3 02/01/2022   CHOLHDL 3 07/05/2021   CHOLHDL 2 08/18/2020   Lab Results  Component Value Date   LDLDIRECT 120.0 05/09/2018   LDLDIRECT 121.0 11/28/2017   LDLDIRECT 106.0 10/03/2017      Abnormal liver functions:  He has had mild increase in liver functions, possibly from fatty liver although his ultrasound was normal in 2011.  No recent history of excessive alcohol May possibly have had high ALT from some statins in the past  ALT has been normal consistently   Lab Results  Component Value Date   ALT 19 02/01/2022     He has had a 4x 5-mm  prolactinoma since 2007 with baseline prolactin level of 101, treated with Dostinex .  He has been taken off his medication, previously because of his noncompliance with this Now prolactin consistently normal even without cabergoline  Testosterone level has been normal with normalization of prolactin  Lab Results  Component Value Date   PROLACTIN 13.0 07/05/2021   PROLACTIN 9.0 08/18/2020   PROLACTIN 7.1 03/19/2020   PROLACTIN 7.9 12/17/2019   PROLACTIN 5.4 03/20/2019      Lab Results  Component Value Date   TESTOSTERONE 396.03 07/05/2021   Erectile dysfunction treated with Cialis     Examination:   There were no vitals taken for this visit.  There is no height  or weight  on file to calculate BMI.    ASSESSMENT/ PLAN:   Diabetes type 2 with mild obesity  See history of present illness for detailed discussion of current diabetes management, blood sugar patterns and problems identified  His A1c is excellent at 6.1  With taking Trulicity regularly his blood sugars are markedly improved, weight is stable He is slightly not exercising enough and can do better Also monitoring at home is inadequate  Since he has blood sugars are as low as 77 fasting he can leave off his Amaryl for now to avoid hypoglycemia considering his age and A1c being relatively low He is to bring his monitor for review and also check readings after meals more consistently  Hypertension: blood pressure is relatively lower especially standing Also with his creatinine going up likely is getting prerenal azotemia from getting 25 mg chlorthalidone  He will take chlorthalidone every other day or try to cut it in half Message sent to his cardiology pharmacist also  Hyperprolactinemia: Longstanding, previously treated with cabergoline Prolactin and testosterone to be followed periodically   LIPIDS: Well-controlled on pravastatin  Urine microalbumin normal  Follow-up in 3 months  There are no Patient Instructions on file for this visit.   Elayne Snare 02/02/2022, 1:33 PM

## 2022-02-03 ENCOUNTER — Encounter: Payer: Self-pay | Admitting: Endocrinology

## 2022-02-03 ENCOUNTER — Ambulatory Visit: Payer: Medicare HMO | Admitting: Endocrinology

## 2022-02-03 VITALS — BP 154/66 | HR 79 | Ht 70.0 in | Wt 197.4 lb

## 2022-02-03 DIAGNOSIS — I10 Essential (primary) hypertension: Secondary | ICD-10-CM

## 2022-02-03 DIAGNOSIS — D352 Benign neoplasm of pituitary gland: Secondary | ICD-10-CM | POA: Diagnosis not present

## 2022-02-03 DIAGNOSIS — E1165 Type 2 diabetes mellitus with hyperglycemia: Secondary | ICD-10-CM

## 2022-02-03 DIAGNOSIS — E782 Mixed hyperlipidemia: Secondary | ICD-10-CM

## 2022-02-03 NOTE — Patient Instructions (Signed)
Recheck Bp at home  Check blood sugars on waking up 2 days a week  Also check blood sugars about 2 hours after meals and do this after different meals by rotation  Recommended blood sugar levels on waking up are 90-130 and about 2 hours after meal is 130-160  Please bring your blood sugar monitor to each visit, thank you

## 2022-02-04 ENCOUNTER — Telehealth: Payer: Self-pay | Admitting: Pharmacist

## 2022-02-04 NOTE — Telephone Encounter (Signed)
Attempted to call patient to follow up on his BP. His scr is improved. Need to confirm how he is taking his chlorthalidone. His BP was high in office with Dr. Dwyane Dee. Pt thought maybe he wasn't taking his irbesartan. Need to confirm if he was missing a medication and schedule follow up.

## 2022-02-16 DIAGNOSIS — G5603 Carpal tunnel syndrome, bilateral upper limbs: Secondary | ICD-10-CM | POA: Diagnosis not present

## 2022-02-16 DIAGNOSIS — R202 Paresthesia of skin: Secondary | ICD-10-CM | POA: Diagnosis not present

## 2022-02-16 DIAGNOSIS — R4189 Other symptoms and signs involving cognitive functions and awareness: Secondary | ICD-10-CM | POA: Diagnosis not present

## 2022-02-16 DIAGNOSIS — M5416 Radiculopathy, lumbar region: Secondary | ICD-10-CM | POA: Diagnosis not present

## 2022-02-16 DIAGNOSIS — R200 Anesthesia of skin: Secondary | ICD-10-CM | POA: Diagnosis not present

## 2022-03-01 NOTE — Telephone Encounter (Signed)
3rd message left for pt, will await his return call at this time.

## 2022-03-07 IMAGING — DX DG CHEST 2V
2 series · 2 of 2 positions shown · non-contrast
Comparison: 04/01/2021

CLINICAL DATA: Pneumonia.  Symptomatic worsening.

EXAM:
CHEST - 2 VIEW

[chest pa]
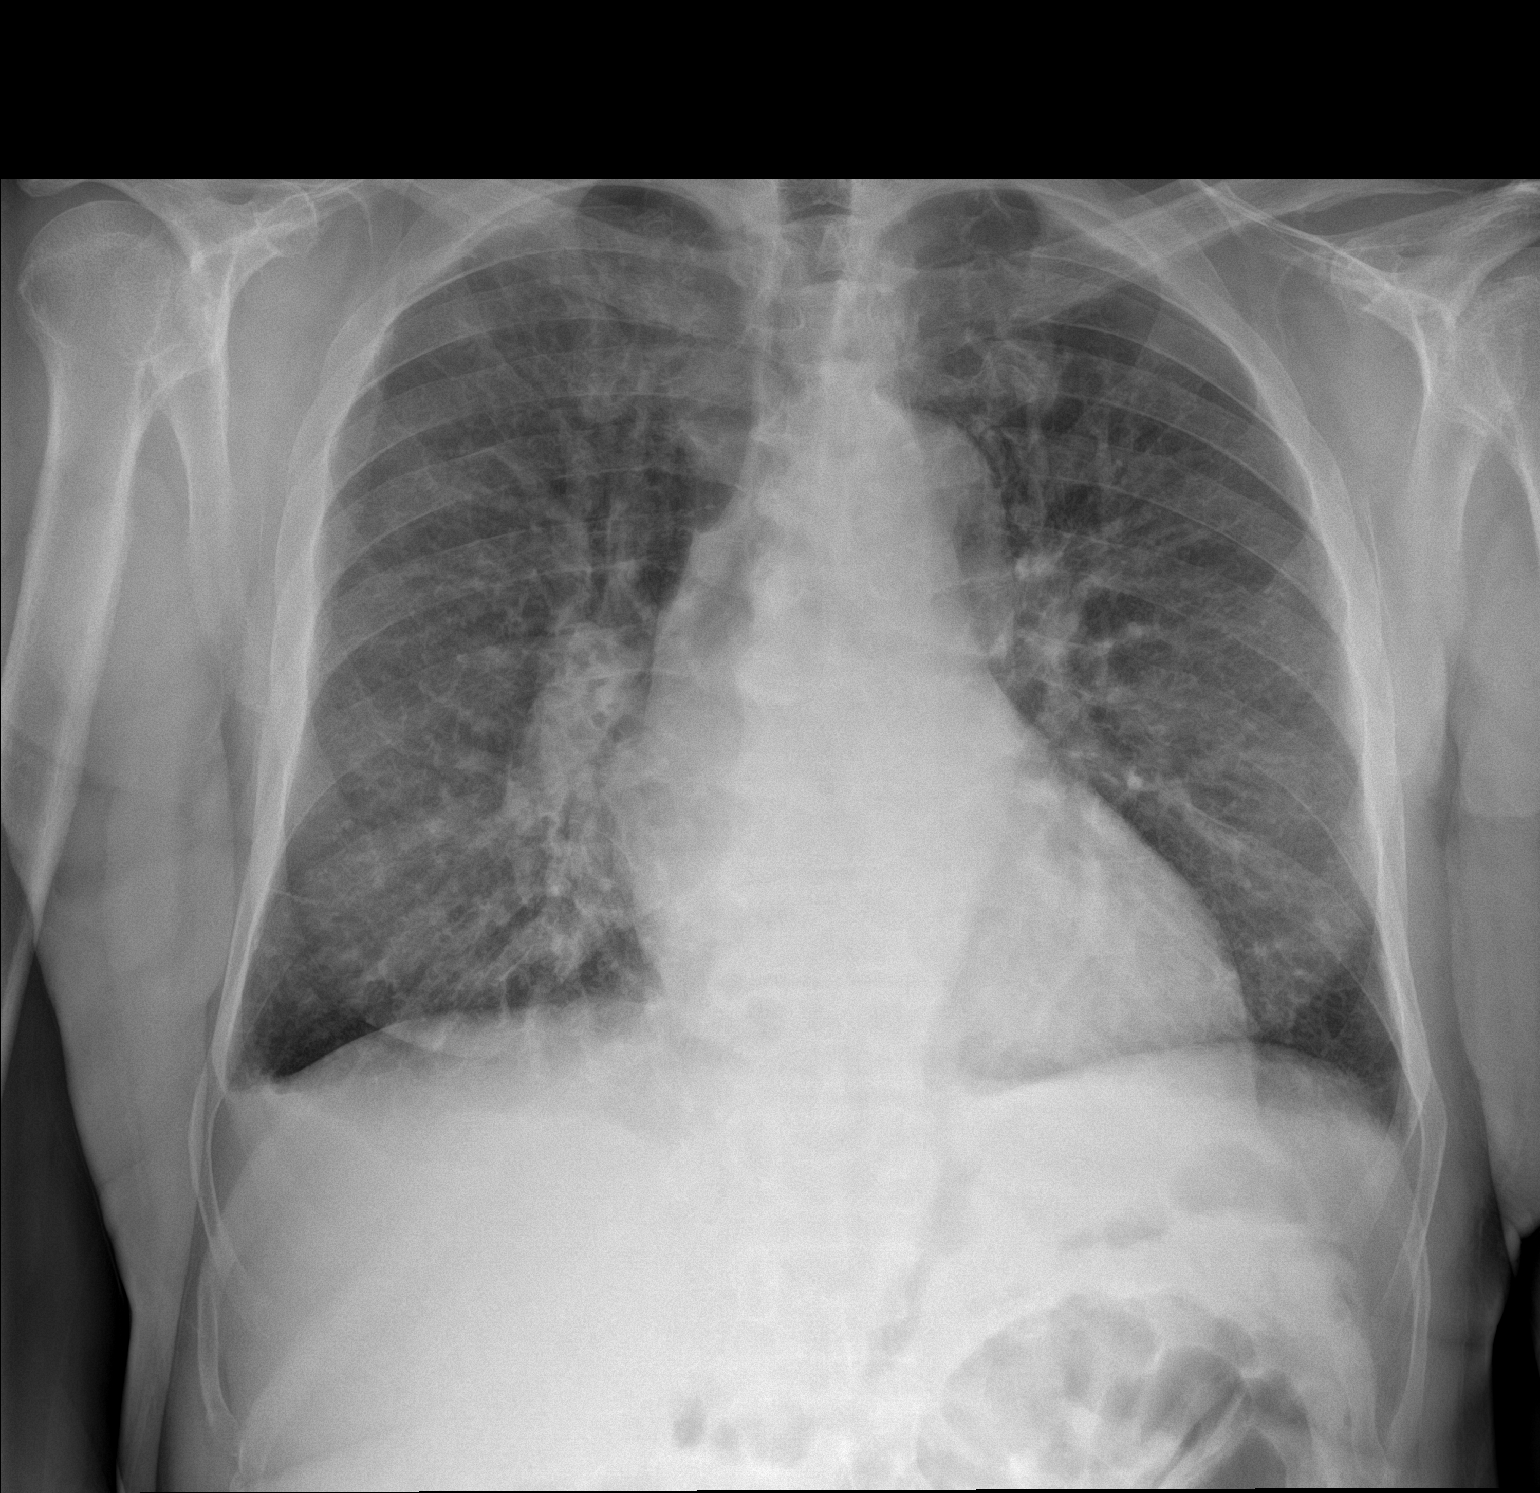

[chest lat]
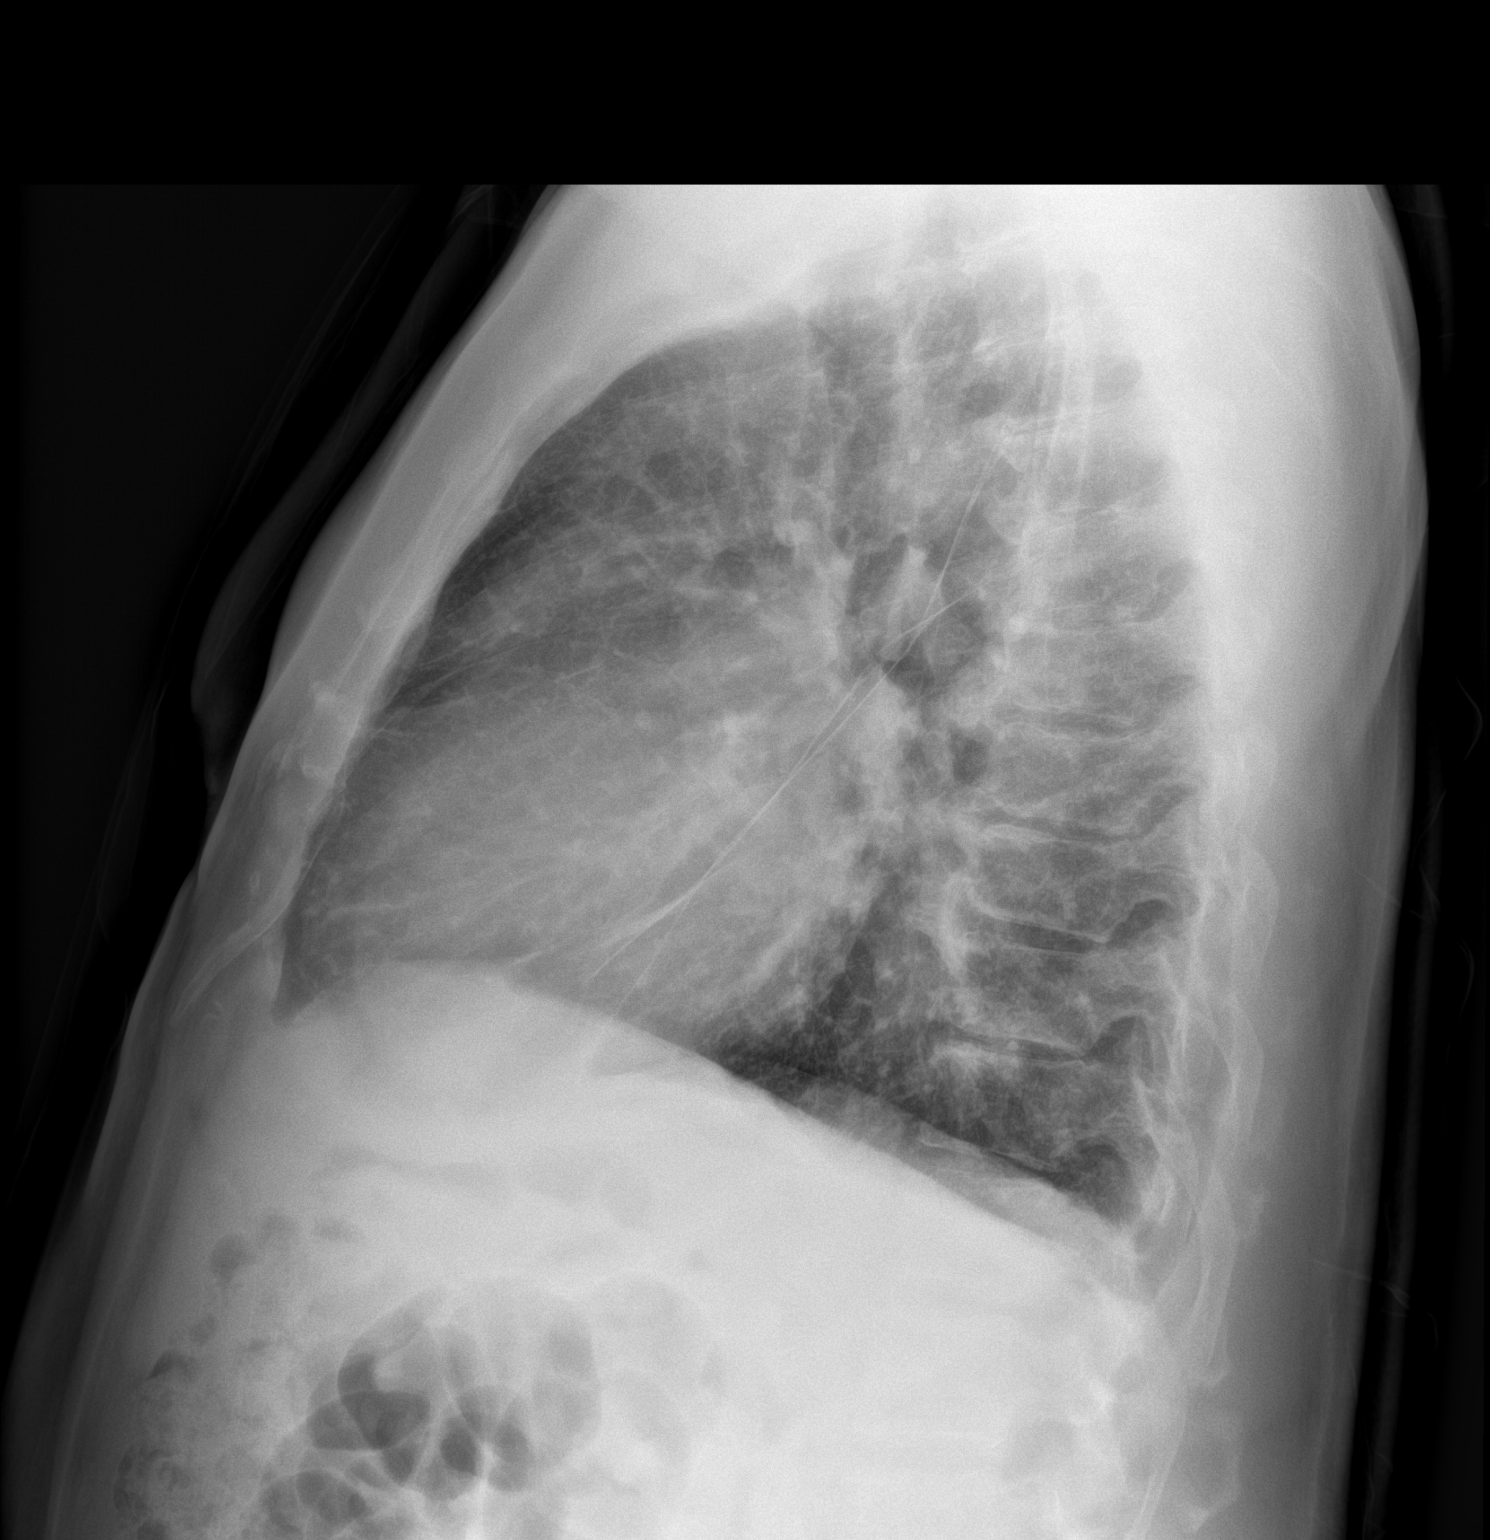

[2 of 2 positions shown; findings below may reference images not displayed]

FINDINGS: Heart size is normal. Tortuous aorta as seen previously. Patchy
pneumonia in the left lower lobe appears fairly similar. Possible
mild worsening elsewhere within both lungs. No dense consolidation
or lobar collapse. There may be a small amount of pleural fluid in
the posterior costophrenic angles.
IMPRESSION: Persistent infiltrate in the right lower lobe. Possible slight
worsening of patchy infiltrates elsewhere throughout the lung. Small
amount of pleural fluid.

## 2022-04-28 DIAGNOSIS — E7800 Pure hypercholesterolemia, unspecified: Secondary | ICD-10-CM | POA: Diagnosis not present

## 2022-04-28 DIAGNOSIS — E1169 Type 2 diabetes mellitus with other specified complication: Secondary | ICD-10-CM | POA: Diagnosis not present

## 2022-04-28 DIAGNOSIS — I10 Essential (primary) hypertension: Secondary | ICD-10-CM | POA: Diagnosis not present

## 2022-05-05 ENCOUNTER — Ambulatory Visit: Payer: Medicare HMO | Admitting: Allergy

## 2022-05-30 ENCOUNTER — Ambulatory Visit: Payer: Medicare HMO | Admitting: Podiatry

## 2022-05-30 ENCOUNTER — Encounter: Payer: Self-pay | Admitting: Podiatry

## 2022-05-30 VITALS — BP 153/78

## 2022-05-30 DIAGNOSIS — M79675 Pain in left toe(s): Secondary | ICD-10-CM

## 2022-05-30 DIAGNOSIS — B351 Tinea unguium: Secondary | ICD-10-CM | POA: Diagnosis not present

## 2022-05-30 DIAGNOSIS — M79674 Pain in right toe(s): Secondary | ICD-10-CM | POA: Diagnosis not present

## 2022-05-30 DIAGNOSIS — E1140 Type 2 diabetes mellitus with diabetic neuropathy, unspecified: Secondary | ICD-10-CM | POA: Diagnosis not present

## 2022-05-30 NOTE — Progress Notes (Signed)
  Subjective:  Patient ID: Joshua Vargas, male    DOB: [birth year],  MRN: [id number]  Joshua Vargas presents to clinic today for preventative diabetic foot care. Chief Complaint  Patient presents with   Nail Problem    DFC BS-did not check today A1C-6.4 PCP-[facility] PCP VST-2023   New problem(s): None.   PCP is Antony Contras, MD.  Allergies  Allergen Reactions   Atorvastatin Itching    Other reaction(s): Other, Unknown Other reaction(s): increased LFT's   Dextran Rash    Other Reaction: Not Assessed Other reaction(s): Other Other Reaction: Not Assessed Other Reaction: Not Assessed Other Reaction: Not Assessed    Review of Systems: Negative except as noted in the HPI.  Objective: No changes noted in today's physical examination. Vitals:   05/30/22 0856  BP: (!) 153/78   Joshua Vargas is a pleasant 77 y.o. male WD, WN in NAD. AAO x 3.  Vascular Examination: Vascular status intact b/l with palpable pedal pulses. Pedal hair present b/l. CFT immediate b/l. No edema. No pain with calf compression b/l. Skin temperature gradient WNL b/l.   Neurological Examination: Sensation grossly intact b/l with 10 gram monofilament. Vibratory sensation intact b/l.   Dermatological Examination: Pedal skin with normal turgor, texture and tone b/l. Toenails 1-5 b/l thick, discolored, elongated with subungual debris and pain on dorsal palpation. No hyperkeratotic lesions noted b/l. Dry skin noted bilateral.   Musculoskeletal Examination: Muscle strength 5/5 to b/l LE. Limited joint ROM to the bilateral ankles.  Radiographs: None  Assessment/Plan: 1. Pain due to onychomycosis of toenails of both feet   2. Type 2 diabetes mellitus with diabetic neuropathy, unspecified whether long term insulin use ([place])     -Consent given for treatment as described below: -Examined patient. -No new findings. No new orders. -Patient to continue soft, supportive shoe gear daily. -Toenails 1-5 b/l  were debrided in length and girth with sterile nail nippers and dremel without iatrogenic bleeding.  -Patient/POA to call should there be question/concern in the interim.   Return in about 3 months (around 08/28/2022).  Marzetta Board, DPM

## 2022-06-02 ENCOUNTER — Other Ambulatory Visit (INDEPENDENT_AMBULATORY_CARE_PROVIDER_SITE_OTHER): Payer: Medicare HMO

## 2022-06-02 DIAGNOSIS — E1165 Type 2 diabetes mellitus with hyperglycemia: Secondary | ICD-10-CM | POA: Diagnosis not present

## 2022-06-02 DIAGNOSIS — D352 Benign neoplasm of pituitary gland: Secondary | ICD-10-CM | POA: Diagnosis not present

## 2022-06-02 LAB — HEMOGLOBIN A1C: Hgb A1c MFr Bld: 7.7 % — ABNORMAL HIGH (ref 4.6–6.5)

## 2022-06-02 LAB — BASIC METABOLIC PANEL
BUN: 19 mg/dL (ref 6–23)
CO2: 30 mEq/L (ref 19–32)
Calcium: 9.9 mg/dL (ref 8.4–10.5)
Chloride: 100 mEq/L (ref 96–112)
Creatinine, Ser: 1.26 mg/dL (ref 0.40–1.50)
GFR: 55.57 mL/min — ABNORMAL LOW (ref >60.00)
Glucose, Bld: 105 mg/dL — ABNORMAL HIGH (ref 70–99)
Potassium: 4.1 mEq/L (ref 3.5–5.1)
Sodium: 139 mEq/L (ref 135–145)

## 2022-06-03 LAB — PROLACTIN: Prolactin: 15.6 ng/mL (ref 3.6–25.2)

## 2022-06-07 ENCOUNTER — Encounter: Payer: Self-pay | Admitting: Endocrinology

## 2022-06-07 ENCOUNTER — Ambulatory Visit: Payer: Medicare HMO | Admitting: Endocrinology

## 2022-06-07 VITALS — BP 146/68 | HR 75 | Ht 70.0 in | Wt 201.4 lb

## 2022-06-07 DIAGNOSIS — E1165 Type 2 diabetes mellitus with hyperglycemia: Secondary | ICD-10-CM | POA: Diagnosis not present

## 2022-06-07 DIAGNOSIS — R7989 Other specified abnormal findings of blood chemistry: Secondary | ICD-10-CM

## 2022-06-07 DIAGNOSIS — E782 Mixed hyperlipidemia: Secondary | ICD-10-CM

## 2022-06-07 MED ORDER — METFORMIN HCL 1000 MG PO TABS: 1000.0000 mg | ORAL_TABLET | Freq: Two times a day (BID) | ORAL | 0 refills | Status: DC

## 2022-06-07 MED ORDER — PRAVASTATIN SODIUM 40 MG PO TABS: 40.0000 mg | ORAL_TABLET | Freq: Every day | ORAL | 0 refills | Status: DC

## 2022-06-07 NOTE — Patient Instructions (Addendum)
Check blood sugars on waking up 1-2days a week  Also check blood sugars about 2 hours after meals and do this after different meals by rotation  Recommended blood sugar levels on waking up are 90-130 and about 2 hours after meal is 130-160  Please bring your blood sugar monitor to each visit, thank you  Restart Carvedilol  B12 check

## 2022-06-07 NOTE — Progress Notes (Signed)
Patient ID: Joshua Vargas, male   DOB: 1946/04/09, 77 y.o.   MRN: [id number]    Reason for Appointment: follow-up of various  problems  History of Present Illness   Type 2 DIABETES MELITUS, date of diagnosis: 2002      Previous history: He was initially treated with metformin alone and then subsequently was also given Actos and Amaryl.  He has had fairly good control but somewhat inconsistent because of difficulties with compliance with diet and exercise periodically. A1c previously has fluctuated between about 6.7-7.6 In 6/15 he was started on Victoza which he wanted to try because of difficulty losing weight and better control  Recent history:   Non-insulin hypoglycemic drugs: Actos 30 mg daily, Amaryl 1 mg in evening, Trulicity 1.5 mg weekly        His A1c is 7.7 compared to 6.5  Current management, blood sugar patterns and problems identified: He again has not been able to get his Trulicity for the last 7 months because of cost issues He does not bring his monitor for download despite reminders and likely he is not checking it much Although he claimed that his blood sugars are not higher than 160 discussed that his A1c is indicating much higher readings However fasting readings recently in the lab was 105 indicating improved fasting control with restarting Trulicity last month He has continued his Actos and Amaryl but does not appear to have refilled his metformin His PCP office wanted to give him Ozempic possibly through patient assistance program but he has a 27-monthsupply of Trulicity Without Trulicity appears to have gained weight He says he is trying to walk , limited by knee pain  Side effects from medications:  anorexia with 1.2 mg Victoza       Monitors blood glucose: Sporadically   Glucometer:  One Touch.           Blood Glucose readings at home, recent range by recall: 90s-160                Dietician visit: Most recent: 06/2016            Wt  Readings from Last 3 Encounters:  06/07/22 201 lb 6.4 oz (91.4 kg)  02/03/22 197 lb 6.4 oz (89.5 kg)  09/29/21 192 lb 6.4 oz (87.3 kg)   LABS:  Lab Results  Component Value Date   HGBA1C 7.7 (H) 06/02/2022   HGBA1C 6.5 02/01/2022   HGBA1C 6.1 09/24/2021   Lab Results  Component Value Date   MICROALBUR 2.5 (H) 09/24/2021   LDLCALC 57 02/01/2022   CREATININE 1.26 06/02/2022     OTHER problems are discussed in review of systems   LABS:  Lab on 06/02/2022  Component Date Value Ref Range Status   Prolactin 06/02/2022 15.6  3.6 - 25.2 ng/mL Final   Sodium 06/02/2022 139  135 - 145 mEq/L Final   Potassium 06/02/2022 4.1  3.5 - 5.1 mEq/L Final   Chloride 06/02/2022 100  96 - 112 mEq/L Final   CO2 06/02/2022 30  19 - 32 mEq/L Final   Glucose, Bld 06/02/2022 105 (H)  70 - 99 mg/dL Final   BUN 06/02/2022 19  6 - 23 mg/dL Final   Creatinine, Ser 06/02/2022 1.26  0.40 - 1.50 mg/dL Final   GFR 06/02/2022 55.57 (L)  >60.00 mL/min Final   Calculated using the CKD-EPI Creatinine Equation (2021)   Calcium 06/02/2022 9.9  8.4 - 10.5 mg/dL Final   Hgb A1c  MFr Bld 06/02/2022 7.7 (H)  4.6 - 6.5 % Final   Glycemic Control Guidelines for People with Diabetes:Non Diabetic:  <6%Goal of Therapy: <7%Additional Action Suggested:  >8%     Allergies as of 06/07/2022       Reactions   Atorvastatin Itching   Other reaction(s): Other, Unknown Other reaction(s): increased LFT's   Dextran Rash   Other Reaction: Not Assessed Other reaction(s): Other Other Reaction: Not Assessed Other Reaction: Not Assessed Other Reaction: Not Assessed        Medication List        Accurate as of June 07, 2022  8:26 AM. If you have any questions, ask your nurse or doctor.          Accu-Chek Guide test strip Generic drug: glucose blood Use as instructed   Accu-Chek Guide w/Device Kit 1 Device by Does not apply route daily.   acetaminophen 500 MG tablet Commonly known as: TYLENOL Take 500 mg  by mouth as needed. pain   albuterol 108 (90 Base) MCG/ACT inhaler Commonly known as: VENTOLIN HFA Inhale 1-2 puffs into the lungs every 6 (six) hours as needed for wheezing or shortness of breath.   amLODipine 10 MG tablet Commonly known as: NORVASC Take 1 tablet (10 mg total) by mouth daily.   aspirin EC 81 MG tablet Take 81 mg by mouth daily.   augmented betamethasone dipropionate 0.05 % cream Commonly known as: DIPROLENE-AF APPLY SPARINGLY TO AFFECTED AREA TWICE A DAY   cabergoline 0.5 MG tablet Commonly known as: DOSTINEX SMARTSIG:1 Tablet(s) By Mouth   carvedilol 6.25 MG tablet Commonly known as: COREG TAKE 1 TABLET BY MOUTH TWICE A DAY   chlorthalidone 25 MG tablet Commonly known as: HYGROTON Take 1 tablet (25 mg total) by mouth daily.   ciclopirox 8 % solution Commonly known as: PENLAC SMARTSIG:1 sparingly Topical Every Night   Gemtesa 75 MG Tabs Generic drug: Vibegron Take 1 tablet by mouth daily.   glimepiride 2 MG tablet Commonly known as: AMARYL TAKE 1/2 TABLET EVERY DAY AT DINNER   irbesartan 300 MG tablet Commonly known as: AVAPRO Take 1 tablet (300 mg total) by mouth daily.   latanoprost 0.005 % ophthalmic solution Commonly known as: XALATAN SMARTSIG:In Eye(s)   metFORMIN 1000 MG tablet Commonly known as: GLUCOPHAGE TAKE 1 TABLET TWICE DAILY WITH MEALS   pioglitazone 30 MG tablet Commonly known as: ACTOS TAKE 1 TABLET EVERY DAY   potassium chloride 10 MEQ tablet Commonly known as: KLOR-CON M TAKE 1 TABLET EVERY DAY   pravastatin 40 MG tablet Commonly known as: PRAVACHOL TAKE 1 TABLET EVERY DAY   tadalafil 5 MG tablet Commonly known as: CIALIS TAKE 1 TABLET EVERY DAY AS NEEDED FOR ERECTILE DYSFUNCTION   tamsulosin 0.4 MG Caps capsule Commonly known as: FLOMAX Take 0.4 mg by mouth daily.   terbinafine 250 MG tablet Commonly known as: LAMISIL SMARTSIG:1 Tablet(s) By Mouth   Trulicity 1.5 0000000 Sopn Generic drug:  Dulaglutide INJECT 1.'5MG'$  (1 PEN) SUBCUTANEOUSLY EVERY WEEK   Vitamin D3 10 MCG (400 UNIT) Caps Generic drug: Cholecalciferol SMARTSIG:1 Capsule(s) By Mouth        Allergies:  Allergies  Allergen Reactions   Atorvastatin Itching    Other reaction(s): Other, Unknown Other reaction(s): increased LFT's   Dextran Rash    Other Reaction: Not Assessed Other reaction(s): Other Other Reaction: Not Assessed Other Reaction: Not Assessed Other Reaction: Not Assessed    Past Medical History:  Diagnosis Date   BEP (benign  enlargement of prostate)    BPH (benign prostatic hyperplasia)    Constipation    Coronary artery disease    Elevated LDL cholesterol level    Family history of pancreatic cancer    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"   History of colonic polyps    Hyperlipemia    Hypertension    Knee pain    Memory loss    Paresthesia    Pneumonia 1990s X 1; ~ 2000 X 1   Prolactinoma ([place])    Type II diabetes mellitus ([place])    Vitreous floaters of right eye     Past Surgical History:  Procedure Laterality Date   CARDIAC CATHETERIZATION  ?2006; 01/25/2018   ELBOW SURGERY Left    "something to do w/the muscle"   JOINT REPLACEMENT     LEFT HEART CATH AND CORONARY ANGIOGRAPHY N/A 01/26/2018   Procedure: LEFT HEART CATH AND CORONARY ANGIOGRAPHY;  [place]: [place], Peter M, MD;  Location: [facility];  Service: Cardiovascular;  Laterality: N/A;   PICC LINE INSERTION  ~ 2005   "took it out months later"   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"   TOTAL KNEE ARTHROPLASTY Left 2005   TRIGGER FINGER RELEASE Bilateral ~ 2016    Family History  Problem Relation Age of Onset   Cancer Father    Diabetes Brother    Heart attack Brother 61   Allergic rhinitis Neg Hx    Angioedema Neg Hx    Asthma Neg Hx    Atopy Neg Hx    Eczema Neg Hx    Immunodeficiency Neg Hx    Urticaria Neg Hx     Social History:  reports that he has  never smoked. He has never used smokeless tobacco. He reports that he does not drink alcohol and does not use drugs.  Review of Systems:   Hypertension:   He has been on a regimen of Avapro 300 and Norvasc 10 mg and [place] He appears not to be taking his carvedilol He is going to be followed up in the cardiology clinic  BP at home 150-160  Blood pressure is high consistently in the office   BP Readings from Last 3 Encounters:  06/07/22 (!) 146/68  05/30/22 (!) 153/78  02/03/22 (!) 154/66   Renal function:  Lab Results  Component Value Date   CREATININE 1.26 06/02/2022   CREATININE 1.02 02/01/2022   CREATININE 1.37 09/24/2021     Potassium is normal with taking the 10 mEq supplement  Lab Results  Component Value Date   K 4.1 06/02/2022     Lipids: He had been on Crestor and simvastatin and these were stopped because of increased liver functions Because of significantly high lipids he has been on 40 mg pravastatin  LDL has been well controlled and below 70 consistently   Lab Results  Component Value Date   CHOL 150 02/01/2022   CHOL 156 07/05/2021   CHOL 160 08/18/2020   Lab Results  Component Value Date   HDL 58.40 02/01/2022   HDL 55.00 07/05/2021   HDL 65.30 08/18/2020   Lab Results  Component Value Date   LDLCALC 57 02/01/2022   LDLCALC 70 07/05/2021   LDLCALC 68 08/18/2020   Lab Results  Component Value Date   TRIG 171.0 (H) 02/01/2022   TRIG 153.0 (H) 07/05/2021   TRIG 135.0 08/18/2020   Lab Results  Component Value Date   CHOLHDL  3 02/01/2022   CHOLHDL 3 07/05/2021   CHOLHDL 2 08/18/2020   Lab Results  Component Value Date   LDLDIRECT 120.0 05/09/2018   LDLDIRECT 121.0 11/28/2017   LDLDIRECT 106.0 10/03/2017      Abnormal liver functions:  He has had mild increase in liver functions, possibly from fatty liver although his ultrasound was normal in 2011.  No recent history of excessive alcohol May possibly have had high ALT from  some statins in the past  ALT has been normal consistently   Lab Results  Component Value Date   ALT 19 02/01/2022     He has had a 4x 5-mm  prolactinoma since 2007 with baseline prolactin level of 101, treated with Dostinex .  He has been taken off his medication, previously because of his noncompliance with this prolactin consistently normal even without cabergoline  Testosterone level has been normal with normalization of prolactin  Lab Results  Component Value Date   PROLACTIN 15.6 06/02/2022   PROLACTIN 13.0 07/05/2021   PROLACTIN 9.0 08/18/2020   PROLACTIN 7.1 03/19/2020   PROLACTIN 7.9 12/17/2019      Lab Results  Component Value Date   TESTOSTERONE 396.03 07/05/2021   Erectile dysfunction treated with Cialis  Finger tips sensitive   Examination:   BP (!) 146/68 (BP Location: Left Arm, Patient Position: Sitting, Cuff Size: Normal)   Pulse 75   Ht '5\' 10"'$  (1.778 m)   Wt 201 lb 6.4 oz (91.4 kg)   SpO2 96%   BMI 28.90 kg/m   Body mass index is 28.9 kg/m.    ASSESSMENT/ PLAN:   Diabetes type 2 with mild obesity  See history of present illness for detailed discussion of current diabetes management, blood sugar patterns and problems identified  His A1c is 7.7 compared to 6.5, previously as low as 6.1  With not taking Trulicity lately because of cost issues his A1c has increased significantly Also likely has not refilled his metformin His weight has gone up as expected and diet can be better too Likely he is not checking readings after meals He says he is a little active at work but not doing much formal exercise  Recommendations for diabetes: Check blood sugars consistently especially after meals and bring monitor for download on each visit Discussed blood sugar targets at various times Regular walking for exercise Start improving diet with better choices and low-fat and low carbohydrate meals If he is able to start Ozempic with the patient assistance  program instead of Trulicity will have to start with 0.25 mg, build up to 0.5 and possibly 1 mg  Hypertension: blood pressure is consistently high checked twice this month Pharmacy refill data does not indicate he is taking his carvedilol from his cardiology pharmacist He will call them and needs to follow-up with the cardiology clinic  Hyperprolactinemia: Longstanding, previously treated with cabergoline Prolactin consistently normal now without medications  LIPIDS: Well-controlled on pravastatin but needs new prescription  Follow-up in 3 months  There are no Patient Instructions on file for this visit.   Elayne Snare 06/07/2022, 8:26 AM

## 2022-06-09 ENCOUNTER — Other Ambulatory Visit: Payer: Self-pay | Admitting: Endocrinology

## 2022-06-09 DIAGNOSIS — Z1331 Encounter for screening for depression: Secondary | ICD-10-CM | POA: Diagnosis not present

## 2022-06-09 DIAGNOSIS — G4730 Sleep apnea, unspecified: Secondary | ICD-10-CM | POA: Diagnosis not present

## 2022-06-09 DIAGNOSIS — I10 Essential (primary) hypertension: Secondary | ICD-10-CM | POA: Diagnosis not present

## 2022-06-09 DIAGNOSIS — R413 Other amnesia: Secondary | ICD-10-CM | POA: Diagnosis not present

## 2022-06-09 DIAGNOSIS — G5603 Carpal tunnel syndrome, bilateral upper limbs: Secondary | ICD-10-CM | POA: Diagnosis not present

## 2022-06-09 DIAGNOSIS — Z0000 Encounter for general adult medical examination without abnormal findings: Secondary | ICD-10-CM | POA: Diagnosis not present

## 2022-06-09 DIAGNOSIS — D352 Benign neoplasm of pituitary gland: Secondary | ICD-10-CM | POA: Diagnosis not present

## 2022-06-09 DIAGNOSIS — E7800 Pure hypercholesterolemia, unspecified: Secondary | ICD-10-CM | POA: Diagnosis not present

## 2022-06-09 DIAGNOSIS — E1165 Type 2 diabetes mellitus with hyperglycemia: Secondary | ICD-10-CM | POA: Diagnosis not present

## 2022-06-13 NOTE — Progress Notes (Signed)
))  Cardiology Office Note:    Date:  06/20/2022   ID:  Joshua Vargas, DOB 1945-10-13, MRN [id number]  PCP:  Antony Contras, MD   [facility] Providers Cardiologist:  Fransico Him, MD     Referring MD: Antony Contras, MD   Chief Complaint: annual follow-up HTN, CAD  History of Present Illness:    Joshua Vargas is a very pleasant 77 y.o. male with a hx of hypertension, CAD, HLD, and diabetes.  Referred to cardiology for evaluation of snoring and hypertension and seen by Dr. Radford Pax on 05/25/2021.  He had previously been seen by cardiology in 2019 at which time he was hospitalized with chest pain and underwent cardiac catheterization showing a 70% D1 stenosis, 25% proximal LAD and normal LV function.  History of poorly controlled blood pressure.  This is in the setting of intermittent noncompliance with taking antihypertensives.  Recently BP has been as high as 210/88 mmHg.  His wife was concerned that he may have obstructive sleep apnea as he has had problems with excessive daytime sleepiness and snores a lot at night.  He appears to be short of breath at night when he is laying down.  Frequently takes naps during the day.  Reported pain in chest that is nonexertional and only lasts a few minutes and resolves.  No associated symptoms.  Had shortness of breath when climbing stairs while not on BP meds but since being back on medication no SOB.  Occasional mild LE edema.  He was advised to continue amlodipine 10 mg daily. Irbesartan HCT was discontinued and he was advised to start irbesartan 300 mg daily with addition of chlorthalidone 25 mg daily.  One week lab appointment and referral to Pharm.D. was arranged. [place] 06/01/2021 revealed no ischemia, low risk study.  Sleep study 06/01/2021 revealed mild obstructive sleep apnea, AHI 12.1/hr. Advised to wear CPAP, however it appears he did not follow-up on getting equipment.   Seen by Fuller Canada, [place] on 06/23/21 for management of  hypertension. BP was elevated in clinic but he reported home BP at goal. Return ov on 07/21/21 at which time he reported home BP at goal, however BP remained elevated in clinic and at recent ov with a different provider. He was advised to start carvedilol 6.25 mg BID and continue additional anti-hypertensives. He was encouraged to bring home BP monitor for calibration.   Most recent cardiology clinic visit was 09/15/21 with Fuller Canada, [place]. At the previous OV 1 month prior, he had incidentally stopped irbesartan. Once irbesartan 300 mg daily was resumed, BP was at goal and he was advised to follow-up as needed.   Today, he is here alone for follow-up. Reports BP was elevated at home about one month ago and he went to see PCP. Discovered he was not taking carvedilol. Once he resumed, BP has been well controlled. Recent home BP was 132/69 mmHg. Occasionally feels poor in the mornings and evenings, but overall feels well. Says it is "old age." Admits he watches a lot of television. Push mows his lawn when weather permits. Sometimes walking is limited by left knee pain. Admits he eats things he likes and that often contain too much sugar, sodium - convenience foods. Did not want to get CPAP - too expensive. Feels that he sleeps well and wife is not concerned about his snoring. He denies chest pain, shortness of breath, lower extremity edema, fatigue, palpitations, melena, hematuria, hemoptysis, diaphoresis, weakness, presyncope, syncope, orthopnea, and PND. No  specific cardiac concerns.   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"   History of colonic polyps    Hyperlipemia    Hypertension    Knee pain    Memory loss    Paresthesia    Pneumonia 1990s X 1; ~  2000 X 1   Prolactinoma ([place])    Type II diabetes mellitus ([place])    Vitreous floaters of right eye     Past Surgical History:  Procedure Laterality Date   CARDIAC CATHETERIZATION  ?2006; 01/25/2018   ELBOW SURGERY Left    "something to do w/the muscle"   JOINT REPLACEMENT     LEFT HEART CATH AND CORONARY ANGIOGRAPHY N/A 01/26/2018   Procedure: LEFT HEART CATH AND CORONARY ANGIOGRAPHY;  [place]: [place], Peter M, MD;  Location: [facility];  Service: Cardiovascular;  Laterality: N/A;   PICC LINE INSERTION  ~ 2005   "took it out months later"   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"   TOTAL KNEE ARTHROPLASTY Left 2005   TRIGGER FINGER RELEASE Bilateral ~ 2016    Current Medications: Current Meds  Medication Sig   acetaminophen (TYLENOL) 500 MG tablet Take 500 mg by mouth as needed. pain   albuterol (VENTOLIN HFA) 108 (90 Base) MCG/ACT inhaler Inhale 1-2 puffs into the lungs every 6 (six) hours as needed for wheezing or shortness of breath.   amLODipine (NORVASC) 10 MG tablet Take 1 tablet (10 mg total) by mouth daily.   aspirin 81 MG EC tablet Take 81 mg by mouth daily.   augmented betamethasone dipropionate (DIPROLENE-AF) 0.05 % cream APPLY SPARINGLY TO AFFECTED AREA TWICE A DAY   Blood Glucose Monitoring Suppl (ACCU-CHEK GUIDE) w/Device KIT 1 Device by Does not apply route daily.   carvedilol (COREG) 6.25 MG tablet TAKE 1 TABLET BY MOUTH TWICE A DAY   chlorthalidone (HYGROTON) 25 MG tablet Take 1 tablet (25 mg total) by mouth daily.   Cholecalciferol (VITAMIN D3) 10 MCG (400 UNIT) CAPS SMARTSIG:1 Capsule(s) By Mouth   ciclopirox (PENLAC) 8 % solution SMARTSIG:1 sparingly Topical Every Night   Dulaglutide (TRULICITY) 1.5 0000000 SOPN INJECT 1.'5MG'$  (1 PEN) SUBCUTANEOUSLY EVERY WEEK   GEMTESA 75 MG TABS Take 1 tablet by mouth daily.   glimepiride (AMARYL) 2 MG tablet TAKE 1/2 TABLET EVERY DAY AT DINNER   glucose blood (ACCU-CHEK GUIDE) test strip Use as  instructed   irbesartan (AVAPRO) 300 MG tablet Take 1 tablet (300 mg total) by mouth daily.   latanoprost (XALATAN) 0.005 % ophthalmic solution SMARTSIG:In Eye(s)   metFORMIN (GLUCOPHAGE) 1000 MG tablet Take 1 tablet (1,000 mg total) by mouth 2 (two) times daily with a meal.   pioglitazone (ACTOS) 30 MG tablet TAKE 1 TABLET EVERY DAY   potassium chloride (KLOR-CON M) 10 MEQ tablet TAKE 1 TABLET EVERY DAY   pravastatin (PRAVACHOL) 40 MG tablet TAKE 1 TABLET EVERY DAY   tadalafil (CIALIS) 5 MG tablet TAKE 1 TABLET EVERY DAY AS NEEDED FOR ERECTILE DYSFUNCTION   tamsulosin (FLOMAX) 0.4 MG CAPS capsule Take 0.4 mg by mouth daily.   terbinafine (LAMISIL) 250 MG tablet SMARTSIG:1 Tablet(s) By Mouth     Allergies:   Atorvastatin  and Dextran   Social History   Socioeconomic History   Marital status: Married    Spouse name: Not on file   Number of children: Not on file   Years of education: Not on file   Highest education level: Not on file  Occupational History   Not on file  Tobacco Use   Smoking status: Never   Smokeless tobacco: Never  Vaping Use   Vaping Use: Never used  Substance and Sexual Activity   Alcohol use: Never   Drug use: Never   Sexual activity: Yes  Other Topics Concern   Not on file  Social History Narrative   Not on file   Social Determinants of Health   Financial Resource Strain: Not on file  Food Insecurity: Not on file  Transportation Needs: Not on file  Physical Activity: Not on file  Stress: Not on file  Social Connections: Not on file     Family History: The patient's family history includes Cancer in his father; Diabetes in his brother; Heart attack (age of onset: 27) in his brother. There is no history of Allergic rhinitis, Angioedema, Asthma, Atopy, Eczema, Immunodeficiency, or Urticaria.  ROS:   Please see the history of present illness.  All other systems reviewed and are negative.  Labs/Other Studies Reviewed:    The following studies were  reviewed today:  Lexiscan Myoview 06/01/21 The study is normal. The study is low risk.   No ST deviation was noted.   LV perfusion is normal. There is no evidence of ischemia. There is no evidence of infarction.   Left ventricular function is abnormal. Nuclear stress EF: 51 %. The left ventricular ejection fraction is mildly decreased (45-54%). End diastolic cavity size is moderately enlarged. End systolic cavity size is moderately enlarged.   Prior study not available for comparison.   Normal perfusion Low risk study  LHC 01/26/18  1st Diag lesion is 70% stenosed. Prox LAD lesion is 25% stenosed. The left ventricular systolic function is normal. LV end diastolic pressure is normal. The left ventricular ejection fraction is 55-65% by visual estimate.   1. 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 This is unchanged since 2006. 2. Normal LV function 3. Normal LVEDP   Plan: medical management. Consider other causes of his symptoms.   Recommend Aspirin '81mg'$  daily for moderate CAD.   Recent Labs: 02/01/2022: ALT 19 06/02/2022: BUN 19; Creatinine, Ser 1.26; Potassium 4.1; Sodium 139  Recent Lipid Panel    Component Value Date/Time   CHOL 150 02/01/2022 0817   TRIG 171.0 (H) 02/01/2022 0817   HDL 58.40 02/01/2022 0817   CHOLHDL 3 02/01/2022 0817   VLDL 34.2 02/01/2022 0817   LDLCALC 57 02/01/2022 0817   LDLDIRECT 120.0 05/09/2018 0815     Risk Assessment/Calculations:      STOP-Bang Score:          Physical Exam:    VS:  BP 138/68   Pulse 78   Ht '5\' 10"'$  (1.778 m)   Wt 202 lb (91.6 kg)   SpO2 95%   BMI 28.98 kg/m     Wt Readings from Last 3 Encounters:  06/20/22 202 lb (91.6 kg)  06/07/22 201 lb 6.4 oz (91.4 kg)  02/03/22 197 lb 6.4 oz (89.5 kg)     GEN:  Well nourished, well developed in no acute distress HEENT: Normal NECK: No JVD; No carotid bruits CARDIAC: RRR, no murmurs, rubs, gallops RESPIRATORY:  Clear to  auscultation without rales, wheezing or rhonchi  ABDOMEN: Soft, non-tender, non-distended MUSCULOSKELETAL:  No edema; No deformity. 2+  pedal pulses, equal bilaterally SKIN: Warm and dry NEUROLOGIC:  Alert and oriented x 3 PSYCHIATRIC:  Normal affect   EKG:  EKG is ordered today.  The ekg ordered today demonstrates normal sinus rhythm at 73 bpm, no acute ST abnormality    Diagnoses:    1. Coronary artery disease involving native coronary artery of native heart without angina pectoris   2. Essential hypertension, benign   3. Hyperlipidemia LDL goal <70   4. Diabetes mellitus with cardiac complication (HCC)    Assessment and Plan:     CAD without angina: Single vessel obstructive CAD with moderate stenosis in a small diagonal branch, unchanged since 2006, normal LV function on cardiac cath 01/2018. He denies chest pain, dyspnea, or other symptoms concerning for angina.  No indication for further ischemic evaluation at this time.  No bleeding concerns. LDL at goal < 70. Continue GDMT including aspirin, amlodipine, carvedilol, irbesartan, pravastatin.  Hypertension: BP initially elevated but improved upon my recheck. Home BP well controlled once he was compliant with all antihypertensives. Renal function is stable.   Hyperlipidemia LDL goal < 70: LDL 57 on 02/01/22. Continue pravastatin. Followed by endocrinology.   Diabetes: Encouraged and raised physical activity to achieve 150 minutes moderate intensity exercise each week. Heart healthy, mostly plant based diet encouraged. Management per endocrinology.     Disposition: 6 months with Dr. Radford Pax or me  Medication Adjustments/Labs and Tests Ordered: Current medicines are reviewed at length with the patient today.  Concerns regarding medicines are outlined above.  Orders Placed This Encounter  Procedures   EKG 12-Lead   No orders of the defined types were placed in this encounter.   Patient Instructions  Medication Instructions:    Your physician recommends that you continue on your current medications as directed. Please refer to the Current Medication list given to you today.   *If you need a refill on your cardiac medications before your next appointment, please call your pharmacy*   Lab Work:  None ordered.  If you have labs (blood work) drawn today and your tests are completely normal, you will receive your results only by: [place] (if you have MyChart) OR A paper copy in the mail If you have any lab test that is abnormal or we need to change your treatment, we will call you to review the results.   Testing/Procedures:  None ordered.   Follow-Up: At [facility], you and your health needs are our priority.  As part of our continuing mission to provide you with exceptional heart care, we have created designated Provider Care Teams.  These Care Teams include your primary Cardiologist (physician) and Advanced Practice Providers (APPs -  Physician Assistants and Nurse Practitioners) who all work together to provide you with the care you need, when you need it.  We recommend signing up for the patient portal called "MyChart".  Sign up information is provided on this After Visit Summary.  MyChart is used to connect with patients for Virtual Visits (Telemedicine).  Patients are able to view lab/test results, encounter notes, upcoming appointments, etc.  Non-urgent messages can be sent to your provider as well.   To learn more about what you can do with MyChart, go to [email or web address].    Your next appointment:   6 month(s)  Provider:   Fransico Him, MD       Signed, [place],  Lanice Schwab, NP  06/20/2022 8:46 AM    [place]

## 2022-06-20 ENCOUNTER — Ambulatory Visit: Payer: Medicare HMO | Attending: Nurse Practitioner | Admitting: Nurse Practitioner

## 2022-06-20 ENCOUNTER — Encounter: Payer: Self-pay | Admitting: Nurse Practitioner

## 2022-06-20 VITALS — BP 138/68 | HR 78 | Ht 70.0 in | Wt 202.0 lb

## 2022-06-20 DIAGNOSIS — E785 Hyperlipidemia, unspecified: Secondary | ICD-10-CM

## 2022-06-20 DIAGNOSIS — I10 Essential (primary) hypertension: Secondary | ICD-10-CM

## 2022-06-20 DIAGNOSIS — E1159 Type 2 diabetes mellitus with other circulatory complications: Secondary | ICD-10-CM | POA: Diagnosis not present

## 2022-06-20 DIAGNOSIS — I2510 Atherosclerotic heart disease of native coronary artery without angina pectoris: Secondary | ICD-10-CM

## 2022-06-20 NOTE — Patient Instructions (Signed)
Medication Instructions:   Your physician recommends that you continue on your current medications as directed. Please refer to the Current Medication list given to you today.  *If you need a refill on your cardiac medications before your next appointment, please call your pharmacy*   Lab Work:  None ordered.  If you have labs (blood work) drawn today and your tests are completely normal, you will receive your results only by: MyChart Message (if you have MyChart) OR A paper copy in the mail If you have any lab test that is abnormal or we need to change your treatment, we will call you to review the results.   Testing/Procedures:  None ordered.   Follow-Up: At  HeartCare, you and your health needs are our priority.  As part of our continuing mission to provide you with exceptional heart care, we have created designated Provider Care Teams.  These Care Teams include your primary Cardiologist (physician) and Advanced Practice Providers (APPs -  Physician Assistants and Nurse Practitioners) who all work together to provide you with the care you need, when you need it.  We recommend signing up for the patient portal called "MyChart".  Sign up information is provided on this After Visit Summary.  MyChart is used to connect with patients for Virtual Visits (Telemedicine).  Patients are able to view lab/test results, encounter notes, upcoming appointments, etc.  Non-urgent messages can be sent to your provider as well.   To learn more about what you can do with MyChart, go to [email or web address].    Your next appointment:   6 month(s)  Provider:   Traci Turner, MD      

## 2022-06-21 DIAGNOSIS — G5603 Carpal tunnel syndrome, bilateral upper limbs: Secondary | ICD-10-CM | POA: Diagnosis not present

## 2022-06-28 DIAGNOSIS — E7800 Pure hypercholesterolemia, unspecified: Secondary | ICD-10-CM | POA: Diagnosis not present

## 2022-06-28 DIAGNOSIS — Z23 Encounter for immunization: Secondary | ICD-10-CM | POA: Diagnosis not present

## 2022-07-25 ENCOUNTER — Other Ambulatory Visit: Payer: Self-pay | Admitting: Cardiology

## 2022-09-01 ENCOUNTER — Other Ambulatory Visit (INDEPENDENT_AMBULATORY_CARE_PROVIDER_SITE_OTHER): Payer: Medicare HMO

## 2022-09-01 DIAGNOSIS — R7989 Other specified abnormal findings of blood chemistry: Secondary | ICD-10-CM | POA: Diagnosis not present

## 2022-09-01 DIAGNOSIS — E1165 Type 2 diabetes mellitus with hyperglycemia: Secondary | ICD-10-CM

## 2022-09-01 DIAGNOSIS — E782 Mixed hyperlipidemia: Secondary | ICD-10-CM | POA: Diagnosis not present

## 2022-09-01 LAB — COMPREHENSIVE METABOLIC PANEL
ALT: 17 U/L (ref 0–53)
AST: 21 U/L (ref 0–37)
Albumin: 4 g/dL (ref 3.5–5.2)
Alkaline Phosphatase: 53 U/L (ref 39–117)
BUN: 18 mg/dL (ref 6–23)
CO2: 30 mEq/L (ref 19–32)
Calcium: 9.3 mg/dL (ref 8.4–10.5)
Chloride: 102 mEq/L (ref 96–112)
Creatinine, Ser: 1.22 mg/dL (ref 0.40–1.50)
GFR: 57.66 mL/min — ABNORMAL LOW (ref >60.00)
Glucose, Bld: 122 mg/dL — ABNORMAL HIGH (ref 70–99)
Potassium: 3.7 mEq/L (ref 3.5–5.1)
Sodium: 140 mEq/L (ref 135–145)
Total Bilirubin: 0.4 mg/dL (ref 0.2–1.2)
Total Protein: 7.3 g/dL (ref 6.0–8.3)

## 2022-09-01 LAB — LDL CHOLESTEROL, DIRECT: Direct LDL: 60 mg/dL

## 2022-09-01 LAB — LIPID PANEL
Cholesterol: 153 mg/dL (ref 0–200)
HDL: 47.8 mg/dL (ref >39.00)
NonHDL: 105.1
Total CHOL/HDL Ratio: 3
Triglycerides: 251 mg/dL — ABNORMAL HIGH (ref 0.0–149.0)
VLDL: 50.2 mg/dL — ABNORMAL HIGH (ref 0.0–40.0)

## 2022-09-01 LAB — MICROALBUMIN / CREATININE URINE RATIO
Creatinine,U: 175.5 mg/dL
Microalb Creat Ratio: 1.5 mg/g (ref 0.0–30.0)
Microalb, Ur: 2.7 mg/dL — ABNORMAL HIGH (ref 0.0–1.9)

## 2022-09-01 LAB — HEMOGLOBIN A1C: Hgb A1c MFr Bld: 7.1 % — ABNORMAL HIGH (ref 4.6–6.5)

## 2022-09-01 LAB — TESTOSTERONE: Testosterone: 266.85 ng/dL — ABNORMAL LOW (ref 300.00–890.00)

## 2022-09-05 NOTE — Progress Notes (Deleted)
  Patient ID: Joshua Vargas, male   DOB: 04/14/1945, 77 y.o.   MRN: [id number]    Reason for Appointment: follow-up of various  problems  History of Present Illness   Type 2 DIABETES MELITUS, date of diagnosis: 2002      Previous history: He was initially treated with metformin alone and then subsequently was also given Actos and Amaryl.  He has had fairly good control but somewhat inconsistent because of difficulties with compliance with diet and exercise periodically. A1c previously has fluctuated between about 6.7-7.6 In 6/15 he was started on Victoza which he wanted to try because of difficulty losing weight and better control  Recent history:   Non-insulin hypoglycemic drugs: Actos 30 mg daily, Amaryl 1 mg in evening, Trulicity 1.5 mg weekly        His A1c is 7.7 compared to 6.5  Current management, blood sugar patterns and problems identified: He again has not been able to get his Trulicity for the last 7 months because of cost issues He does not bring his monitor for download despite reminders and likely he is not checking it much Although he claimed that his blood sugars are not higher than 160 discussed that his A1c is indicating much higher readings However fasting readings recently in the lab was 105 indicating improved fasting control with restarting Trulicity last month He has continued his Actos and Amaryl but does not appear to have refilled his metformin His PCP office wanted to give him Ozempic possibly through patient assistance program but he has a 2-month supply of Trulicity Without Trulicity appears to have gained weight He says he is trying to walk , limited by knee pain  Side effects from medications:  anorexia with 1.2 mg Victoza       Monitors blood glucose: Sporadically   Glucometer:  One Touch.           Blood Glucose readings at home, recent range by recall: 90s-160                Dietician visit: Most recent: 06/2016            Wt  Readings from Last 3 Encounters:  06/07/22 201 lb 6.4 oz (91.4 kg)  02/03/22 197 lb 6.4 oz (89.5 kg)  09/29/21 192 lb 6.4 oz (87.3 kg)   LABS:  Lab Results  Component Value Date   HGBA1C 7.7 (H) 06/02/2022   HGBA1C 6.5 02/01/2022   HGBA1C 6.1 09/24/2021   Lab Results  Component Value Date   MICROALBUR 2.5 (H) 09/24/2021   LDLCALC 57 02/01/2022   CREATININE 1.26 06/02/2022     OTHER problems are discussed in review of systems   LABS:  Lab on 06/02/2022  Component Date Value Ref Range Status   Prolactin 06/02/2022 15.6  3.6 - 25.2 ng/mL Final   Sodium 06/02/2022 139  135 - 145 mEq/L Final   Potassium 06/02/2022 4.1  3.5 - 5.1 mEq/L Final   Chloride 06/02/2022 100  96 - 112 mEq/L Final   CO2 06/02/2022 30  19 - 32 mEq/L Final   Glucose, Bld 06/02/2022 105 (H)  70 - 99 mg/dL Final   BUN 06/02/2022 19  6 - 23 mg/dL Final   Creatinine, Ser 06/02/2022 1.26  0.40 - 1.50 mg/dL Final   GFR 06/02/2022 55.57 (L)  >60.00 mL/min Final   Calculated using the CKD-EPI Creatinine Equation (2021)   Calcium 06/02/2022 9.9  8.4 - 10.5 mg/dL Final   Hgb A1c   MFr Bld 06/02/2022 7.7 (H)  4.6 - 6.5 % Final   Glycemic Control Guidelines for People with Diabetes:Non Diabetic:  <6%Goal of Therapy: <7%Additional Action Suggested:  >8%     Allergies as of 06/07/2022       Reactions   Atorvastatin Itching   Other reaction(s): Other, Unknown Other reaction(s): increased LFT's   Dextran Rash   Other Reaction: Not Assessed Other reaction(s): Other Other Reaction: Not Assessed Other Reaction: Not Assessed Other Reaction: Not Assessed        Medication List        Accurate as of June 07, 2022  8:26 AM. If you have any questions, ask your nurse or doctor.          Accu-Chek Guide test strip Generic drug: glucose blood Use as instructed   Accu-Chek Guide w/Device Kit 1 Device by Does not apply route daily.   acetaminophen 500 MG tablet Commonly known as: TYLENOL Take 500 mg  by mouth as needed. pain   albuterol 108 (90 Base) MCG/ACT inhaler Commonly known as: VENTOLIN HFA Inhale 1-2 puffs into the lungs every 6 (six) hours as needed for wheezing or shortness of breath.   amLODipine 10 MG tablet Commonly known as: NORVASC Take 1 tablet (10 mg total) by mouth daily.   aspirin EC 81 MG tablet Take 81 mg by mouth daily.   augmented betamethasone dipropionate 0.05 % cream Commonly known as: DIPROLENE-AF APPLY SPARINGLY TO AFFECTED AREA TWICE A DAY   cabergoline 0.5 MG tablet Commonly known as: DOSTINEX SMARTSIG:1 Tablet(s) By Mouth   carvedilol 6.25 MG tablet Commonly known as: COREG TAKE 1 TABLET BY MOUTH TWICE A DAY   chlorthalidone 25 MG tablet Commonly known as: HYGROTON Take 1 tablet (25 mg total) by mouth daily.   ciclopirox 8 % solution Commonly known as: PENLAC SMARTSIG:1 sparingly Topical Every Night   Gemtesa 75 MG Tabs Generic drug: Vibegron Take 1 tablet by mouth daily.   glimepiride 2 MG tablet Commonly known as: AMARYL TAKE 1/2 TABLET EVERY DAY AT DINNER   irbesartan 300 MG tablet Commonly known as: AVAPRO Take 1 tablet (300 mg total) by mouth daily.   latanoprost 0.005 % ophthalmic solution Commonly known as: XALATAN SMARTSIG:In Eye(s)   metFORMIN 1000 MG tablet Commonly known as: GLUCOPHAGE TAKE 1 TABLET TWICE DAILY WITH MEALS   pioglitazone 30 MG tablet Commonly known as: ACTOS TAKE 1 TABLET EVERY DAY   potassium chloride 10 MEQ tablet Commonly known as: KLOR-CON M TAKE 1 TABLET EVERY DAY   pravastatin 40 MG tablet Commonly known as: PRAVACHOL TAKE 1 TABLET EVERY DAY   tadalafil 5 MG tablet Commonly known as: CIALIS TAKE 1 TABLET EVERY DAY AS NEEDED FOR ERECTILE DYSFUNCTION   tamsulosin 0.4 MG Caps capsule Commonly known as: FLOMAX Take 0.4 mg by mouth daily.   terbinafine 250 MG tablet Commonly known as: LAMISIL SMARTSIG:1 Tablet(s) By Mouth   Trulicity 1.5 MG/0.5ML Sopn Generic drug:  Dulaglutide INJECT 1.5MG (1 PEN) SUBCUTANEOUSLY EVERY WEEK   Vitamin D3 10 MCG (400 UNIT) Caps Generic drug: Cholecalciferol SMARTSIG:1 Capsule(s) By Mouth        Allergies:  Allergies  Allergen Reactions   Atorvastatin Itching    Other reaction(s): Other, Unknown Other reaction(s): increased LFT's   Dextran Rash    Other Reaction: Not Assessed Other reaction(s): Other Other Reaction: Not Assessed Other Reaction: Not Assessed Other Reaction: Not Assessed    Past Medical History:  Diagnosis Date   BEP (benign   enlargement of prostate)    BPH (benign prostatic hyperplasia)    Constipation    Coronary artery disease    Elevated LDL cholesterol level    Family history of pancreatic cancer    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"   History of colonic polyps    Hyperlipemia    Hypertension    Knee pain    Memory loss    Paresthesia    Pneumonia 1990s X 1; ~ 2000 X 1   Prolactinoma (HCC)    Type II diabetes mellitus (HCC)    Vitreous floaters of right eye     Past Surgical History:  Procedure Laterality Date   CARDIAC CATHETERIZATION  ?2006; 01/25/2018   ELBOW SURGERY Left    "something to do w/the muscle"   JOINT REPLACEMENT     LEFT HEART CATH AND CORONARY ANGIOGRAPHY N/A 01/26/2018   Procedure: LEFT HEART CATH AND CORONARY ANGIOGRAPHY;  Chevez: Jordan, Peter M, MD;  Location: [facility];  Service: Cardiovascular;  Laterality: N/A;   PICC LINE INSERTION  ~ 2005   "took it out months later"   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"   TOTAL KNEE ARTHROPLASTY Left 2005   TRIGGER FINGER RELEASE Bilateral ~ 2016    Family History  Problem Relation Age of Onset   Cancer Father    Diabetes Brother    Heart attack Brother 51   Allergic rhinitis Neg Hx    Angioedema Neg Hx    Asthma Neg Hx    Atopy Neg Hx    Eczema Neg Hx    Immunodeficiency Neg Hx    Urticaria Neg Hx     Social History:  reports that he has  never smoked. He has never used smokeless tobacco. He reports that he does not drink alcohol and does not use drugs.  Review of Systems:   Hypertension:   He has been on a regimen of Avapro 300 and Norvasc 10 mg and [place] He appears not to be taking his carvedilol He is going to be followed up in the cardiology clinic  BP at home 150-160  Blood pressure is high consistently in the office   BP Readings from Last 3 Encounters:  06/07/22 (!) 146/68  05/30/22 (!) 153/78  02/03/22 (!) 154/66   Renal function:  Lab Results  Component Value Date   CREATININE 1.26 06/02/2022   CREATININE 1.02 02/01/2022   CREATININE 1.37 09/24/2021     Potassium is normal with taking the 10 mEq supplement  Lab Results  Component Value Date   K 4.1 06/02/2022     Lipids: He had been on Crestor and simvastatin and these were stopped because of increased liver functions Because of significantly high lipids he has been on 40 mg pravastatin  LDL has been well controlled and below 70 consistently   Lab Results  Component Value Date   CHOL 150 02/01/2022   CHOL 156 07/05/2021   CHOL 160 08/18/2020   Lab Results  Component Value Date   HDL 58.40 02/01/2022   HDL 55.00 07/05/2021   HDL 65.30 08/18/2020   Lab Results  Component Value Date   LDLCALC 57 02/01/2022   LDLCALC 70 07/05/2021   LDLCALC 68 08/18/2020   Lab Results  Component Value Date   TRIG 171.0 (H) 02/01/2022   TRIG 153.0 (H) 07/05/2021   TRIG 135.0 08/18/2020   Lab Results  Component Value Date   CHOLHDL   3 02/01/2022   CHOLHDL 3 07/05/2021   CHOLHDL 2 08/18/2020   Lab Results  Component Value Date   LDLDIRECT 120.0 05/09/2018   LDLDIRECT 121.0 11/28/2017   LDLDIRECT 106.0 10/03/2017      Abnormal liver functions:  He has had mild increase in liver functions, possibly from fatty liver although his ultrasound was normal in 2011.  No recent history of excessive alcohol May possibly have had high ALT from  some statins in the past  ALT has been normal consistently   Lab Results  Component Value Date   ALT 19 02/01/2022     He has had a 4x 5-mm  prolactinoma since 2007 with baseline prolactin level of 101, treated with Dostinex .  He has been taken off his medication, previously because of his noncompliance with this prolactin consistently normal even without cabergoline  Testosterone level has been normal with normalization of prolactin  Lab Results  Component Value Date   PROLACTIN 15.6 06/02/2022   PROLACTIN 13.0 07/05/2021   PROLACTIN 9.0 08/18/2020   PROLACTIN 7.1 03/19/2020   PROLACTIN 7.9 12/17/2019      Lab Results  Component Value Date   TESTOSTERONE 396.03 07/05/2021   Erectile dysfunction treated with Cialis  Finger tips sensitive   Examination:   BP (!) 146/68 (BP Location: Left Arm, Patient Position: Sitting, Cuff Size: Normal)   Pulse 75   Ht 5' 10" (1.778 m)   Wt 201 lb 6.4 oz (91.4 kg)   SpO2 96%   BMI 28.90 kg/m   Body mass index is 28.9 kg/m.    ASSESSMENT/ PLAN:   Diabetes type 2 with mild obesity  See history of present illness for detailed discussion of current diabetes management, blood sugar patterns and problems identified  His A1c is 7.7 compared to 6.5, previously as low as 6.1  With not taking Trulicity lately because of cost issues his A1c has increased significantly Also likely has not refilled his metformin His weight has gone up as expected and diet can be better too Likely he is not checking readings after meals He says he is a little active at work but not doing much formal exercise  Recommendations for diabetes: Check blood sugars consistently especially after meals and bring monitor for download on each visit Discussed blood sugar targets at various times Regular walking for exercise Start improving diet with better choices and low-fat and low carbohydrate meals If he is able to start Ozempic with the patient assistance  program instead of Trulicity will have to start with 0.25 mg, build up to 0.5 and possibly 1 mg  Hypertension: blood pressure is consistently high checked twice this month Pharmacy refill data does not indicate he is taking his carvedilol from his cardiology pharmacist He will call them and needs to follow-up with the cardiology clinic  Hyperprolactinemia: Longstanding, previously treated with cabergoline Prolactin consistently normal now without medications  LIPIDS: Well-controlled on pravastatin but needs new prescription  Follow-up in 3 months  There are no Patient Instructions on file for this visit.   Indiya Izquierdo 06/07/2022, 8:26 AM  

## 2022-09-06 ENCOUNTER — Ambulatory Visit: Payer: Medicare HMO | Admitting: Endocrinology

## 2022-09-06 DIAGNOSIS — E1165 Type 2 diabetes mellitus with hyperglycemia: Secondary | ICD-10-CM

## 2022-09-06 DIAGNOSIS — R7989 Other specified abnormal findings of blood chemistry: Secondary | ICD-10-CM

## 2022-09-13 ENCOUNTER — Ambulatory Visit (INDEPENDENT_AMBULATORY_CARE_PROVIDER_SITE_OTHER): Payer: Medicare HMO | Admitting: Podiatry

## 2022-09-13 DIAGNOSIS — Z91199 Patient's noncompliance with other medical treatment and regimen due to unspecified reason: Secondary | ICD-10-CM

## 2022-09-13 NOTE — Progress Notes (Signed)
1. No-show for appointment     

## 2022-09-20 ENCOUNTER — Encounter: Payer: Self-pay | Admitting: Podiatry

## 2022-09-20 ENCOUNTER — Ambulatory Visit (INDEPENDENT_AMBULATORY_CARE_PROVIDER_SITE_OTHER): Payer: Medicare HMO | Admitting: Podiatry

## 2022-09-20 VITALS — BP 138/63 | HR 73

## 2022-09-20 DIAGNOSIS — M79675 Pain in left toe(s): Secondary | ICD-10-CM

## 2022-09-20 DIAGNOSIS — M79674 Pain in right toe(s): Secondary | ICD-10-CM

## 2022-09-20 DIAGNOSIS — B351 Tinea unguium: Secondary | ICD-10-CM

## 2022-09-20 DIAGNOSIS — S90211A Contusion of right great toe with damage to nail, initial encounter: Secondary | ICD-10-CM

## 2022-09-20 DIAGNOSIS — E1140 Type 2 diabetes mellitus with diabetic neuropathy, unspecified: Secondary | ICD-10-CM | POA: Diagnosis not present

## 2022-09-25 NOTE — Progress Notes (Signed)
  Subjective:  Patient ID: Joshua Vargas, male    DOB: 07-05-1945,  MRN: [id number]  Joshua Vargas presents to clinic today for at risk foot care with history of diabetic neuropathy and painful thick toenails that are difficult to trim. Pain interferes with ambulation. Aggravating factors include wearing enclosed shoe gear. Pain is relieved with periodic professional debridement.  Chief Complaint  Patient presents with   diabitic foot care     A1c 7.1 on 09/01/22 by Endocrinology    New problem(s): Patient states he stubbed his right great toe a few days ago and it's a little tender. He did not treat it.   PCP is Tally Joe, MD.  Allergies  Allergen Reactions   Atorvastatin Itching    Other reaction(s): Other, Unknown Other reaction(s): increased LFT's   Dextran Rash    Other Reaction: Not Assessed Other reaction(s): Other Other Reaction: Not Assessed Other Reaction: Not Assessed Other Reaction: Not Assessed    Review of Systems: Negative except as noted in the HPI.  Objective:  Vitals:   09/20/22 1609  BP: 138/63  Pulse: 73  SpO2: 98%   Joshua Vargas is a pleasant 77 y.o. male WD, WN in NAD. AAO x 3.  Vascular Examination: Vascular status intact b/l with palpable pedal pulses. Pedal hair present b/l. CFT immediate b/l. No edema. No pain with calf compression b/l. Skin temperature gradient WNL b/l.   Neurological Examination: Sensation grossly intact b/l with 10 gram monofilament. Vibratory sensation intact b/l.   Dermatological Examination: Pedal skin with normal turgor, texture and tone b/l. Toenails left great toe and 2-5 b/l thick, discolored, elongated with subungual debris and pain on dorsal palpation. No hyperkeratotic lesions noted b/l.   There is evidence of acute subungual hematoma of the right great toe. Nailplate is loose. There is tenderness to palpation. Small area of granulation tissue dorsal nailbed. No purulence, no erythema, no  edema.  Musculoskeletal Examination: Muscle strength 5/5 to b/l LE. Limited joint ROM to the bilateral ankles.  Radiographs: None  Assessment/Plan: 1. Pain due to onychomycosis of toenails of both feet   2. Contusion of right great toe with damage to nail, initial encounter   3. Type 2 diabetes mellitus with diabetic neuropathy, unspecified whether long term insulin use (HCC)   -Patient was evaluated and treated. All patient's and/or POA's questions/concerns answered on today's visit. -Consent given for treatment as described below: -Loose nailplate right great toe debrided to level of adherence. Nailbed cleansed with alcohol. Silver nitrate applied to granulation tissue of nailbed. Light dressing applied. He is to apply Neosporin Cream once daily to nailbed until resolved. Call office if he has any problems. -Patient to continue soft, supportive shoe gear daily. -Toenails 2-5 bilaterally and left great toe debrided in length and girth without iatrogenic bleeding with sterile nail nipper and dremel.  -Patient/POA to call should there be question/concern in the interim.   Return in about 3 months (around 12/21/2022).  Freddie Breech, DPM

## 2022-10-28 DIAGNOSIS — E119 Type 2 diabetes mellitus without complications: Secondary | ICD-10-CM | POA: Diagnosis not present

## 2022-10-28 DIAGNOSIS — H524 Presbyopia: Secondary | ICD-10-CM | POA: Diagnosis not present

## 2022-10-28 DIAGNOSIS — H4720 Unspecified optic atrophy: Secondary | ICD-10-CM | POA: Diagnosis not present

## 2022-10-28 DIAGNOSIS — H532 Diplopia: Secondary | ICD-10-CM | POA: Diagnosis not present

## 2022-10-28 LAB — HM DIABETES EYE EXAM

## 2022-10-31 ENCOUNTER — Encounter: Payer: Self-pay | Admitting: Endocrinology

## 2022-12-23 ENCOUNTER — Other Ambulatory Visit: Payer: Self-pay | Admitting: Cardiology

## 2022-12-26 ENCOUNTER — Ambulatory Visit (INDEPENDENT_AMBULATORY_CARE_PROVIDER_SITE_OTHER): Payer: Medicare HMO | Admitting: Podiatry

## 2022-12-26 DIAGNOSIS — Z91199 Patient's noncompliance with other medical treatment and regimen due to unspecified reason: Secondary | ICD-10-CM

## 2022-12-26 NOTE — Progress Notes (Signed)
1. No-show for appointment

## 2023-01-02 ENCOUNTER — Encounter: Payer: Self-pay | Admitting: Cardiology

## 2023-01-02 ENCOUNTER — Telehealth: Payer: Self-pay | Admitting: *Deleted

## 2023-01-02 ENCOUNTER — Ambulatory Visit: Payer: Medicare HMO | Attending: Cardiology | Admitting: Cardiology

## 2023-01-02 ENCOUNTER — Ambulatory Visit: Payer: Medicare HMO | Admitting: Cardiology

## 2023-01-02 VITALS — BP 162/74 | HR 65 | Ht 70.0 in | Wt 196.0 lb

## 2023-01-02 DIAGNOSIS — I10 Essential (primary) hypertension: Secondary | ICD-10-CM

## 2023-01-02 DIAGNOSIS — G4733 Obstructive sleep apnea (adult) (pediatric): Secondary | ICD-10-CM

## 2023-01-02 DIAGNOSIS — I2510 Atherosclerotic heart disease of native coronary artery without angina pectoris: Secondary | ICD-10-CM

## 2023-01-02 DIAGNOSIS — R072 Precordial pain: Secondary | ICD-10-CM

## 2023-01-02 DIAGNOSIS — E785 Hyperlipidemia, unspecified: Secondary | ICD-10-CM | POA: Diagnosis not present

## 2023-01-02 DIAGNOSIS — Z79899 Other long term (current) drug therapy: Secondary | ICD-10-CM

## 2023-01-02 MED ORDER — CARVEDILOL 12.5 MG PO TABS
12.5000 mg | ORAL_TABLET | Freq: Two times a day (BID) | ORAL | 2 refills | Status: AC
Start: 2023-01-02 — End: ?

## 2023-01-02 NOTE — Patient Instructions (Addendum)
Medication Instructions:   INCREASE YOUR CARVEDILOL (COREG) TO 12.5 MG BY MOUTH TWICE DAILY  *If you need a refill on your cardiac medications before your next appointment, please call your pharmacy*   You have been referred to OUR PHARMACIST HERE IN THE OFFICE TO BE SEEN IN 2 WEEKS FOR MEDICATION MANAGMENT    Testing/Procedures:  Your physician has recommended that you have a ITAMAR sleep study. This test records several body functions during sleep, including: brain activity, eye movement, oxygen and carbon dioxide blood levels, heart rate and rhythm, breathing rate and rhythm, the flow of air through your mouth and nose, snoring, body muscle movements, and chest and belly movement.  OUR SLEEP COORDINATORS WILL CALL YOU SOON WITH ACTIVATION CODE TO YOUR ITAMAR SLEEP STUDY DEVICE YOU RECEIVED TODAY IN THE CLINIC     How to Prepare for Your Cardiac PET/CT Stress Test:  1. Please do not take these medications before your test:   Medications that may interfere with the cardiac pharmacological stress agent (ex. nitrates - including erectile dysfunction medications, isosorbide mononitrate, tamulosin or beta-blockers) the day of the exam. (Erectile dysfunction medication should be held for at least 72 hrs prior to test)  PLEASE HOLD YOUR TAMULOSIN AND CARVEDILOL THE DAY OF THIS EXAM---   PLEASE HOLD YOUR CIALIS AT LEAST 72 HOUR PRIOR TO THIS TEST  Your remaining medications may be taken with water.  2. Nothing to eat or drink, except water, 3 hours prior to arrival time.   NO caffeine/decaffeinated products, or chocolate 12 hours prior to arrival.  3. NO perfume, cologne or lotion on chest or abdomen area.          -  4. Total time is 1 to 2 hours; you may want to bring reading material for the waiting time.  5. Please report to Radiology at the [facility] Main Entrance 30 minutes early for your test.  [address] [place], [place] [address]  6. Please report to  Radiology at [facility] Main Entrance, medical mall, 30 mins prior to your test.  [address]  [place], [place]  [phone]  Diabetic Preparation:  Hold oral medications. You may take NPH and Lantus insulin. Do not take Humalog or Humulin R (Regular Insulin) the day of your test. Check blood sugars prior to leaving the house. If able to eat breakfast prior to 3 hour fasting, you may take all medications, including your insulin, Do not worry if you miss your breakfast dose of insulin - start at your next meal. Patients who wear a continuous glucose monitor MUST remove the device prior to scanning.    In preparation for your appointment, medication and supplies will be purchased.  Appointment availability is limited, so if you need to cancel or reschedule, please call the Radiology Department at [phone] [facility]) OR [phone] [facility])  24 hours in advance to avoid a cancellation fee of $100.00  What to Expect After you Arrive:  Once you arrive and check in for your appointment, you will be taken to a preparation room within the Radiology Department.  A technologist or Nurse will obtain your medical history, verify that you are correctly prepped for the exam, and explain the procedure.  Afterwards,  an IV will be started in your arm and electrodes will be placed on your skin for EKG monitoring during the stress portion of the exam. Then you will be escorted to the PET/CT scanner.  There, staff will  get you positioned on the scanner and obtain a blood pressure and EKG.  During the exam, you will continue to be connected to the EKG and blood pressure machines.  A small, safe amount of a radioactive tracer will be injected in your IV to obtain a series of pictures of your heart along with an injection of a stress agent.    After your Exam:  It is recommended that you eat a meal and drink a caffeinated beverage to counter act any effects of the stress  agent.  Drink plenty of fluids for the remainder of the day and urinate frequently for the first couple of hours after the exam.  Your doctor will inform you of your test results within 7-10 business days.  For more information and frequently asked questions, please visit our website : [email or web address]  For questions about your test or how to prepare for your test, please call: Cardiac Imaging Nurse Navigators Office: [phone]    Follow-Up:  1.)  6 MONTHS WITH AN EXTENDER IN THE OFFICE  2.)  ONE YEAR WITH DR. Mayford Knife IN THE OFFICE

## 2023-01-02 NOTE — Progress Notes (Addendum)
Cardiology Note    Date:  01/02/2023   ID:  Joshua Vargas, DOB 1945-05-25, MRN [id number]  PCP:  Tally Joe, MD  Cardiologist:  Armanda Magic, MD   Chief Complaint  Patient presents with   Coronary Artery Disease   Hypertension   Hyperlipidemia   Sleep Apnea    History of Present Illness:  Joshua Vargas is a 77 y.o. African-American male with a history of CAD, hyperlipidemia, hypertension and diabetes mellitus He was dx with CAD in 2019 at which time he was hospitalized with chest pain underwent cardiac cath showing a 70% D1, 25% proximal LAD and normal LV function.  Medical management was recommended and he was lost to followup by Cards.    Throughout the years he has had a lot of issues with poorly controlled blood pressure.  This is in the setting of intermittent noncompliance with taking blood pressure medications.  At his last office visit his wife was concerned that he may have have obstructive sleep apnea due to significant snoring and complaints of excessive daytime sleepiness.  He would have to nap during the day.  He was also having problems with exertional shortness of breath as well as some atypical chest pain.  Lexiscan Myoview 05/2021 showed no ischemia with EF 51%.  He underwent home sleep study 05/2021 which demonstrated mild obstructive sleep apnea with an AHI of 12.1/h with moderate snoring and nocturnal hypoxemia with O2 saturations less than 88% for 28 minutes.  An auto CPAP was ordered but he never followed up.  He therefore has not received a PAP device.  At last OV with  Clair Gulling, NP  in March he told her that his wife is not concerned that he snores and he feels he sleeps well and does not want to use CPAP feels is too expensive even with Medicare.  He is here today for followup and is doing well.  He continues to have CP at times when he is exerting himself.  It occurs over the left breast but is not sure that it radiates (he cannot remember). He  sweats constantly so cannot determine if it is worse when he gets the chest discomfort.  He has had some dizzy spells but he is very vague in his description and some of his sx sound like balance problems.  It occurs when he is standing. He denies any SOB, DOE, PND, orthopnea, LE edema, palpitations or syncope. He is compliant with his meds and is tolerating meds with no SE.    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 pain   albuterol (VENTOLIN HFA) 108 (90 Base) MCG/ACT inhaler Inhale 1-2 puffs into the lungs every 6 (six) hours as needed for wheezing or shortness of  breath.   amLODipine (NORVASC) 10 MG tablet TAKE 1 TABLET EVERY DAY   aspirin 81 MG EC tablet Take 81 mg by mouth daily.   augmented betamethasone dipropionate (DIPROLENE-AF) 0.05 % cream APPLY SPARINGLY TO AFFECTED AREA TWICE A DAY   Blood Glucose Monitoring Suppl (ACCU-CHEK GUIDE) w/Device KIT 1 Device by Does not apply route daily.   carvedilol (COREG) 6.25 MG tablet TAKE 1 TABLET BY MOUTH TWICE A DAY   chlorthalidone (HYGROTON) 25 MG tablet TAKE 1 TABLET EVERY DAY   Cholecalciferol (VITAMIN D3) 10 MCG (400 UNIT) CAPS SMARTSIG:1 Capsule(s) By Mouth   ciclopirox (PENLAC) 8 % solution SMARTSIG:1 sparingly Topical Every Night   Dulaglutide (TRULICITY) 1.5  MG/0.5ML SOPN INJECT 1.5MG  (1 PEN) SUBCUTANEOUSLY EVERY WEEK   GEMTESA 75 MG TABS Take 1 tablet by mouth daily.   glimepiride (AMARYL) 2 MG tablet TAKE 1/2 TABLET EVERY DAY AT DINNER   glucose blood (ACCU-CHEK GUIDE) test strip Use as instructed   irbesartan (AVAPRO) 300 MG tablet TAKE 1 TABLET EVERY DAY   latanoprost (XALATAN) 0.005 % ophthalmic solution SMARTSIG:In Eye(s)   metFORMIN (GLUCOPHAGE) 1000 MG tablet Take 1 tablet (1,000 mg total) by mouth 2 (two) times daily with a meal.   pioglitazone (ACTOS) 30 MG tablet TAKE 1 TABLET EVERY DAY   potassium chloride (KLOR-CON M) 10 MEQ tablet TAKE 1 TABLET EVERY DAY   pravastatin (PRAVACHOL) 40 MG tablet TAKE 1 TABLET EVERY DAY   tadalafil (CIALIS) 5 MG tablet TAKE 1 TABLET EVERY DAY AS NEEDED FOR ERECTILE DYSFUNCTION   tamsulosin (FLOMAX) 0.4 MG CAPS capsule Take 0.4 mg by mouth daily.   terbinafine (LAMISIL) 250 MG tablet SMARTSIG:1 Tablet(s) By Mouth    Allergies:   Atorvastatin and Dextran   Social History   Socioeconomic History   Marital status: Married    Spouse name: Not on file   Number of children: Not on file   Years of education: Not on file   Highest education level: Not on file  Occupational History   Not on file  Tobacco Use   Smoking status: Never   Smokeless tobacco: Never  Vaping Use   Vaping status: Never Used  Substance and Sexual Activity   Alcohol use: Never   Drug use: Never   Sexual activity: Yes  Other Topics Concern   Not on file  Social History Narrative   Not on file   Social Determinants of Health   Financial Resource Strain: Low Risk  (02/15/2022)   Received from [facility], [facility]   Overall Financial Resource Strain (CARDIA)    Difficulty of Paying Living Expenses: Not very hard  Food Insecurity: No Food Insecurity (02/15/2022)   Received from [facility], [facility]   Hunger Vital Sign    Worried About Running Out of Food in the Last Year: Never true    Ran Out of Food in  the Last Year: Never true  Transportation Needs: Not on file  Physical Activity: Sufficiently Active (02/15/2022)   Received from [facility], [facility]   Exercise Vital Sign    Days of Exercise per Week: 4 days    Minutes of Exercise per Session: 60 min  Stress: No Stress Concern Present (02/15/2022)   Received from [facility], [facility] - Occupational Stress Questionnaire    Feeling of Stress : Not at all  Social Connections: Socially Integrated (02/15/2022)   Received from [facility], [facility]   Social  Network    How would you rate your social network (family, work, friends)?: Good participation with social networks     Family History:  The patient's family history includes Cancer in his father; Diabetes in his brother; Heart attack (age of onset: 26) in his brother.   ROS:   Please see the history of present illness.    ROS All other systems reviewed and are negative.      No data to display             PHYSICAL EXAM:   VS:  BP (!) 162/74   Pulse 65   Ht 5\' 10"  (1.778 m)   Wt 196 lb (88.9 kg)   SpO2 98%   BMI 28.12 kg/m     Orthostatic VS for the past 24 hrs (Last 3 readings):  BP- Lying Pulse- Lying BP- Sitting Pulse- Sitting BP- Standing at 0 minutes Pulse- Standing at 0 minutes BP- Standing at 3 minutes Pulse- Standing at 3 minutes  01/02/23 1501 146/75 68 143/75 75 161/76 78 174/82 82    GEN: Well nourished, well developed in no acute distress HEENT: Normal NECK: No JVD; No carotid bruits LYMPHATICS: No lymphadenopathy CARDIAC:RRR, no murmurs, rubs, gallops RESPIRATORY:  Clear to auscultation without rales, wheezing or rhonchi  ABDOMEN: Soft, non-tender, non-distended MUSCULOSKELETAL:  No edema; No deformity  SKIN: Warm and dry NEUROLOGIC:  Alert and oriented x 3 PSYCHIATRIC:  Normal affect  Wt Readings from Last 3 Encounters:  01/02/23 196 lb (88.9 kg)  06/20/22 202 lb (91.6 kg)  06/07/22  201 lb 6.4 oz (91.4 kg)      Studies/Labs Reviewed:     Recent Labs: 09/01/2022: ALT 17; BUN 18; Creatinine, Ser 1.22; Potassium 3.7; Sodium 140   Lipid Panel    Component Value Date/Time   CHOL 153 09/01/2022 0807   TRIG 251.0 (H) 09/01/2022 0807   HDL 47.80 09/01/2022 0807   CHOLHDL 3 09/01/2022 0807   VLDL 50.2 (H) 09/01/2022 0807   LDLCALC 57 02/01/2022 0817   LDLDIRECT 60.0 09/01/2022 0807    Additional studies/ records that were reviewed today include:      ASSESSMENT:    1. Essential hypertension, benign   2. Coronary artery disease involving native coronary artery of native heart without angina pectoris   3. Hyperlipidemia LDL goal <70   4. OSA (obstructive sleep apnea)       PLAN:  In order of problems listed above:  Hypertension -BP fairly well-controlled on exam today -Continue drug management with amlodipine 10 mg daily, chlorthalidone 25 mg daily and irbesartan 300 mg daily with as needed refills>>his wife says that he is noncompliant with his meds>>encouraged his wife to give him his meds daily and watch him take them -Increase Carvedilol 12.5mg  BID -followup with Pharm D in HTN clinic in 2 weeks -I have personally reviewed and interpreted outside labs performed by patient's PCP which showed serum creatinine 1.22 and potassium 3.7 on 09/01/2022  2.  ASCAD -Cath on 01/26/2018 showed 70% D1 and 25% proximal LAD with normal LV function -He had an nuclear stress test 05/2021 showing no ischemia and low normal LV function -He still has intermittent CP from time to time that is very vague in description and similar to what he had a year ago when he had the stress test.  His wife says that he does complain of chest pain but also has arm issues -Informed Consent   Shared Decision Making/Informed Consent The risks [chest pain,  shortness of breath, cardiac arrhythmias, dizziness, blood pressure fluctuations, myocardial infarction, stroke/transient ischemic  attack, nausea, vomiting, allergic reaction, radiation exposure, metallic taste sensation and life-threatening complications (estimated to be 1 in 10,000)], benefits (risk stratification, diagnosing coronary artery disease, treatment guidance) and alternatives of a cardiac PET stress test were discussed in detail with Mr. Careers adviser and he agrees to proceed. -Continue prescription drug management with amlodipine 10 mg daily, pravastatin 40 mg daily and aspirin 81 mg daily -increasing Carvedilol to 12.5mg  BID  3.  HLD -LDL goal is less than 70 -I have personally reviewed and interpreted outside labs performed by patient's PCP which showed LDL 61 and HDL 47 on 09/01/2022 -Continue prescription drug management with pravastatin 40 mg daily with as needed refills  4.  OSA -He underwent home sleep study which showed mild obstructive sleep apnea with an AHI of 12/h and auto CPAP was ordered but we were never able to get in touch with the patient to get started on his CPAP despite multiple messages being left by [place] and the DME -He then called and told our office that he did not want to use CPAP but he felt it was too expensive, he slept well at night and his wife did not complain of snoring -after discussing with him the cardiac effects of untreated OSA including poorly controlled HTN>>he agrees to try the CPAP -his HST is over a year old so will need to do another Home Sleep Study  5.  Dizziness -unclear etiology  -orthostatic BPs today were normal and he was dizzy when he stood up>>suspect balance issues so will refer back to PCP -encouraged him to avoid standing up too fast and not standing for long periods of time -encouraged him to drink 64oz of fluids daily  Time Spent: 20 minutes total time of encounter, including 15 minutes spent in face-to-face patient care on the date of this encounter. This time includes coordination of care and counseling regarding above mentioned problem list. Remainder of  non-face-to-face time involved reviewing chart documents/testing relevant to the patient encounter and documentation in the medical record. I have independently reviewed documentation from referring provider  Medication Adjustments/Labs and Tests Ordered: Current medicines are reviewed at length with the patient today.  Concerns regarding medicines are outlined above.  Medication changes, Labs and Tests ordered today are listed in the Patient Instructions below.  There are no Patient Instructions on file for this visit.   Signed, Armanda Magic, MD  01/02/2023 2:41 PM    [facility] [address] [place], [place], [place]  [address] Phone: [phone]; Fax: [phone]

## 2023-01-02 NOTE — Addendum Note (Signed)
Addended by: Quintella Reichert on: 01/02/2023 03:17 PM   Modules accepted: Orders

## 2023-01-02 NOTE — Addendum Note (Signed)
Addended by: Loa Socks on: 01/02/2023 03:32 PM   Modules accepted: Orders

## 2023-01-02 NOTE — Telephone Encounter (Signed)
Patient agreement reviewed and signed on 01/02/2023.  WatchPAT issued to patient on 01/02/2023 by [name], CMA Patient aware to not open the WatchPAT box until contacted with the activation PIN. Patient profile initialized in CloudPAT on 01/02/2023 by Dennis Bast, RN. Device serial number: [id number]  Please list Reason for Call as Advice Only and type "WatchPAT issued to patient" in the comment box.

## 2023-01-03 ENCOUNTER — Telehealth: Payer: Self-pay | Admitting: Endocrinology

## 2023-01-03 MED ORDER — TRULICITY 1.5 MG/0.5ML ~~LOC~~ SOAJ: SUBCUTANEOUS | 0 refills | Status: DC

## 2023-01-03 MED ORDER — GLIMEPIRIDE 2 MG PO TABS: ORAL_TABLET | ORAL | 0 refills | Status: DC

## 2023-01-03 NOTE — Telephone Encounter (Signed)
Patient needs a new refill on Glimepiride, Trulicity,send to [facility]. Patient is almost out of medication. Please advise patient

## 2023-01-03 NOTE — Telephone Encounter (Signed)
done

## 2023-01-05 NOTE — Telephone Encounter (Signed)
Prior Authorization for Automatic Data sent to [facility] via web portal. Tracking Number . 6 NO PA REQ FOR MEDICARE   Ordering provider: TRACI TURNER Associated diagnoses: G47.33 WatchPAT PA obtained on 01/05/2023 by Latrelle Dodrill, CMA. Authorization: No; tracking ID   Patient notified of PIN (1234) on 01/05/2023 via Notification Method: phone.  Phone note routed to covering staff for follow-up.  Instructions for covering staff:  Please contact patient in 2 weeks if WatchPAT study results are not available yet. Remind patient to complete test.  If patient declines to proceed with test, please confirm that box is unopened and remind patient to return it to the office within 30 days. Route phone note to CV DIV SLEEP STUDIES pool for tracking.  If box has been opened, please route phone note to Erskine Squibb (billing department).

## 2023-01-11 ENCOUNTER — Encounter: Payer: Self-pay | Admitting: *Deleted

## 2023-01-11 NOTE — Telephone Encounter (Signed)
This encounter was created in error - please disregard.

## 2023-01-11 NOTE — Telephone Encounter (Signed)
-----   Message from Nurse Corky Crafts sent at 01/02/2023  3:16 PM EDT ----- Regarding: Joshua Vargas SLEEP STUDY PER DR. Mayford Knife Dr. Mayford Knife ordered a [facility] on this pt for known OSA  ITAMAR Home Sleep Study   Can you please pre-cert    Thanks, Lajoyce Corners

## 2023-01-12 DIAGNOSIS — D352 Benign neoplasm of pituitary gland: Secondary | ICD-10-CM | POA: Diagnosis not present

## 2023-01-12 DIAGNOSIS — R61 Generalized hyperhidrosis: Secondary | ICD-10-CM | POA: Diagnosis not present

## 2023-01-12 DIAGNOSIS — E7800 Pure hypercholesterolemia, unspecified: Secondary | ICD-10-CM | POA: Diagnosis not present

## 2023-01-12 DIAGNOSIS — D649 Anemia, unspecified: Secondary | ICD-10-CM | POA: Diagnosis not present

## 2023-01-12 DIAGNOSIS — G4730 Sleep apnea, unspecified: Secondary | ICD-10-CM | POA: Diagnosis not present

## 2023-01-12 DIAGNOSIS — Z23 Encounter for immunization: Secondary | ICD-10-CM | POA: Diagnosis not present

## 2023-01-12 DIAGNOSIS — I10 Essential (primary) hypertension: Secondary | ICD-10-CM | POA: Diagnosis not present

## 2023-01-12 DIAGNOSIS — R413 Other amnesia: Secondary | ICD-10-CM | POA: Diagnosis not present

## 2023-01-12 DIAGNOSIS — E119 Type 2 diabetes mellitus without complications: Secondary | ICD-10-CM | POA: Diagnosis not present

## 2023-01-17 ENCOUNTER — Ambulatory Visit: Payer: Medicare HMO | Attending: Cardiology

## 2023-01-17 NOTE — Progress Notes (Deleted)
Patient ID: Joshua Vargas                 DOB: 1945-05-05                      MRN: [id number]     HPI: Joshua Vargas Colley is a 77 y.o. male referred by Dr. Mayford Knife to HTN clinic. PMH is significant for CAD with 70% D1, 25% proximal LAD  stenosis on 2019 cath, HLD, HTN, DM, OSA, and intermittent med noncompliance. Pt last seen by Dr Mayford Knife on 01/02/23 where BP was elevated at 162/74, pt's wife reported noncompliance with his meds, and his carvedilol was increased to 12.5mg  BID. Also reported chest pain with exertion and has been scheduled for stress test on 10/15.  Dizziness, balance problems, referred back to pcp Needs med compliance Diet and exercise Would need spiro (assuming HR 60 and can't titrate coreg again)  Current HTN meds: amlodipine 10mg  daily, chlorthalidone 25mg  daily, irbesartan 300mg  daily, carvedilol 12.5mg  BID  BP goal: <130/56mmHg  Family History: Cancer in his father; Diabetes in his brother; Heart attack (age of onset: 41) in his brother.   Social History: No tobacco, alcohol or drug use.  Diet:   Exercise:   Home BP readings:   Wt Readings from Last 3 Encounters:  01/02/23 196 lb (88.9 kg)  06/20/22 202 lb (91.6 kg)  06/07/22 201 lb 6.4 oz (91.4 kg)   BP Readings from Last 3 Encounters:  01/02/23 (!) 162/74  09/20/22 138/63  06/20/22 138/68   Pulse Readings from Last 3 Encounters:  01/02/23 65  09/20/22 73  06/20/22 78    Renal function: CrCl cannot be calculated (Patient's most recent lab result is older than the maximum 21 days allowed.).  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 pain     albuterol (VENTOLIN HFA) 108 (90 Base) MCG/ACT inhaler Inhale 1-2 puffs into the lungs every 6 (six) hours as needed for wheezing or shortness of breath. 1 each 0   amLODipine (NORVASC) 10 MG tablet TAKE 1 TABLET EVERY DAY 90 tablet 3   aspirin 81 MG EC tablet Take 81 mg by mouth daily.     augmented betamethasone dipropionate (DIPROLENE-AF) 0.05 % cream APPLY SPARINGLY TO AFFECTED AREA TWICE A DAY     Blood Glucose Monitoring Suppl (ACCU-CHEK GUIDE) w/Device KIT 1 Device by Does not apply route daily. 1 kit 0   carvedilol (COREG) 12.5 MG tablet Take 1 tablet (12.5 mg total) by mouth 2 (two) times daily. 180 tablet 2   chlorthalidone (HYGROTON) 25 MG tablet TAKE 1 TABLET EVERY DAY 90 tablet 3   Cholecalciferol (VITAMIN D3) 10  MCG (400 UNIT) CAPS SMARTSIG:1 Capsule(s) By Mouth     ciclopirox (PENLAC) 8 % solution SMARTSIG:1 sparingly Topical Every Night     Dulaglutide (TRULICITY) 1.5 MG/0.5ML SOPN INJECT 1.5MG  (1 PEN) SUBCUTANEOUSLY EVERY WEEK 6 mL 0   GEMTESA 75 MG TABS Take 1 tablet by mouth daily.     glimepiride (AMARYL) 2 MG tablet TAKE 1/2 TABLET EVERY DAY AT DINNER 45 tablet 0   glucose blood (ACCU-CHEK GUIDE) test strip Use as instructed 100 each 12   irbesartan (AVAPRO) 300 MG tablet TAKE 1 TABLET EVERY DAY 90 tablet 3   latanoprost (XALATAN) 0.005 % ophthalmic solution SMARTSIG:In Eye(s)     metFORMIN (GLUCOPHAGE) 1000 MG tablet Take 1 tablet (1,000 mg total) by mouth 2 (two) times daily with a meal. 180 tablet 0   pioglitazone (ACTOS) 30 MG tablet TAKE 1 TABLET EVERY DAY 90 tablet 0   potassium chloride (KLOR-CON M) 10 MEQ tablet TAKE 1 TABLET EVERY DAY 90 tablet 0   pravastatin (PRAVACHOL)  40 MG tablet TAKE 1 TABLET EVERY DAY 90 tablet 3   tadalafil (CIALIS) 5 MG tablet TAKE 1 TABLET EVERY DAY AS NEEDED FOR ERECTILE DYSFUNCTION 30 tablet 0   tamsulosin (FLOMAX) 0.4 MG CAPS capsule Take 0.4 mg by mouth daily.     terbinafine (LAMISIL) 250 MG tablet SMARTSIG:1 Tablet(s) By Mouth     No current facility-administered medications on file prior to visit.    Allergies  Allergen Reactions   Atorvastatin Itching    Other reaction(s): Other, Unknown Other reaction(s): increased LFT's   Dextran Rash    Other Reaction: Not Assessed Other reaction(s): Other Other Reaction: Not Assessed Other Reaction: Not Assessed Other Reaction: Not Assessed     Assessment/Plan:  1. Hypertension -

## 2023-01-19 ENCOUNTER — Encounter (HOSPITAL_COMMUNITY): Payer: Self-pay

## 2023-01-23 ENCOUNTER — Telehealth (HOSPITAL_COMMUNITY): Payer: Self-pay | Admitting: *Deleted

## 2023-01-23 NOTE — Telephone Encounter (Signed)
Received call from patient regarding upcoming cardiac imaging study; pt verbalizes understanding of appt date/time, parking situation and where to check in, pre-test NPO status and medications ordered, and verified current allergies; name and call back number provided for further questions should they arise Joshua Frame RN Navigator Cardiac Imaging [facility] [phone] office [phone] cell  Patient aware to avoid caffeine and hold cialis prior to PET appt.

## 2023-01-23 NOTE — Telephone Encounter (Signed)
Attempted to call patient regarding upcoming cardiac PET appointment. Left message on voicemail with name and callback number  Larey Brick RN Navigator Cardiac Imaging [facility] [phone] Office [phone] Cell  Reminder to hold cialis and caffeine prior to cardiac PET appt.

## 2023-01-24 ENCOUNTER — Encounter (HOSPITAL_COMMUNITY)
Admission: RE | Admit: 2023-01-24 | Discharge: 2023-01-24 | Disposition: A | Discharge status: Home / Self Care | Payer: Medicare HMO | Source: Ambulatory Visit | Attending: Cardiology | Admitting: Cardiology

## 2023-01-24 DIAGNOSIS — E785 Hyperlipidemia, unspecified: Secondary | ICD-10-CM

## 2023-01-24 DIAGNOSIS — R072 Precordial pain: Secondary | ICD-10-CM

## 2023-01-24 DIAGNOSIS — I2510 Atherosclerotic heart disease of native coronary artery without angina pectoris: Secondary | ICD-10-CM

## 2023-01-24 DIAGNOSIS — I10 Essential (primary) hypertension: Secondary | ICD-10-CM | POA: Diagnosis not present

## 2023-01-24 LAB — NM PET CT CARDIAC PERFUSION MULTI W/ABSOLUTE BLOODFLOW
LV dias vol: 165 mL (ref 62–150)
LV sys vol: 71 mL
MBFR: 2.45
Nuc Rest EF: 61 %
Nuc Stress EF: 57 %
Rest MBF: 0.62 ml/g/min
Rest Nuclear Isotope Dose: 23.1 mCi
ST Depression (mm): 0 mm
Stress MBF: 1.52 ml/g/min
Stress Nuclear Isotope Dose: 23.1 mCi
TID: 1.1

## 2023-01-24 MED ORDER — RUBIDIUM RB82 GENERATOR (RUBYFILL)
23.0000 | PACK | Freq: Once | INTRAVENOUS | Status: AC
  Administered 2023-01-24: 23.08 via INTRAVENOUS

## 2023-01-24 MED ORDER — REGADENOSON 0.4 MG/5ML IV SOLN
INTRAVENOUS | Status: AC
  Filled 2023-01-24: qty 5

## 2023-01-24 MED ORDER — RUBIDIUM RB82 GENERATOR (RUBYFILL)
23.0000 | PACK | Freq: Once | INTRAVENOUS | Status: AC
  Administered 2023-01-24: 23.18 via INTRAVENOUS

## 2023-01-24 MED ORDER — REGADENOSON 0.4 MG/5ML IV SOLN
0.4000 mg | Freq: Once | INTRAVENOUS | Status: AC
  Administered 2023-01-24: 0.4 mg via INTRAVENOUS

## 2023-01-24 NOTE — Progress Notes (Signed)
Tolerated test well

## 2023-01-25 ENCOUNTER — Telehealth: Payer: Self-pay

## 2023-01-25 NOTE — Telephone Encounter (Signed)
-----   Message from Nurse Corky Crafts sent at 01/25/2023  8:43 AM EDT -----  ----- Message ----- From: Quintella Reichert, MD Sent: 01/24/2023   2:20 PM EDT To: [facility] Triage  Normal noncardiac portion of Stress PET CT

## 2023-01-25 NOTE — Telephone Encounter (Signed)
Called patient to review normal stress test and imaging results, patient verbalizes understanding.

## 2023-01-26 DIAGNOSIS — N401 Enlarged prostate with lower urinary tract symptoms: Secondary | ICD-10-CM | POA: Diagnosis not present

## 2023-01-26 DIAGNOSIS — R351 Nocturia: Secondary | ICD-10-CM | POA: Diagnosis not present

## 2023-01-26 DIAGNOSIS — N5201 Erectile dysfunction due to arterial insufficiency: Secondary | ICD-10-CM | POA: Diagnosis not present

## 2023-02-07 ENCOUNTER — Ambulatory Visit: Payer: Medicare HMO

## 2023-02-07 NOTE — Progress Notes (Deleted)
Patient ID: Joshua Vargas                 DOB: [birth year]                      MRN: [id number]     HPI: Joshua Vargas is a 77 y.o. male referred by Dr. Mayford Vargas to HTN clinic. PMH is significant for CAD with 70% D1, 25% proximal LAD  stenosis on 2019 cath, HLD, HTN, DM, OSA, and intermittent med noncompliance. Pt last seen by Dr Joshua Vargas on 01/02/23 where BP was elevated at 162/74, pt's wife reported noncompliance with his meds, and his carvedilol was increased to 12.5mg  BID. Also reported chest pain with exertion and has been scheduled for stress test on 10/15.  Dizziness, balance problems, referred back to pcp Needs med compliance Diet and exercise Would need spiro (assuming HR 60 and can't titrate coreg again)  Current HTN meds: amlodipine 10mg  daily, chlorthalidone 25mg  daily, irbesartan 300mg  daily, carvedilol 12.5mg  BID  BP goal: <130/60mmHg  Family History: Cancer in his father; Diabetes in his brother; Heart attack (age of onset: 99) in his brother.   Social History: No tobacco, alcohol or drug use.  Diet:   Exercise:   Home BP readings:   Wt Readings from Last 3 Encounters:  01/02/23 196 lb (88.9 kg)  06/20/22 202 lb (91.6 kg)  06/07/22 201 lb 6.4 oz (91.4 kg)   BP Readings from Last 3 Encounters:  01/24/23 (!) 118/47  01/02/23 (!) 162/74  09/20/22 138/63   Pulse Readings from Last 3 Encounters:  01/24/23 67  01/02/23 65  09/20/22 73    Renal function: CrCl cannot be calculated (Patient's most recent lab result is older than the maximum 21 days allowed.).  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 pain     albuterol (VENTOLIN HFA) 108 (90 Base) MCG/ACT inhaler Inhale 1-2 puffs into the lungs every 6 (six) hours as needed for wheezing or shortness of breath. 1 each 0   amLODipine (NORVASC) 10 MG tablet TAKE 1 TABLET EVERY DAY 90 tablet 3   aspirin 81 MG EC tablet Take 81 mg by mouth daily.     augmented betamethasone dipropionate (DIPROLENE-AF) 0.05 % cream APPLY SPARINGLY TO AFFECTED AREA TWICE A DAY     Blood Glucose Monitoring Suppl (ACCU-CHEK GUIDE) w/Device KIT 1 Device by Does not apply route daily. 1 kit 0   carvedilol (COREG) 12.5 MG tablet Take 1 tablet (12.5 mg total) by mouth 2 (two) times daily. 180 tablet 2   chlorthalidone (HYGROTON) 25 MG tablet TAKE 1 TABLET EVERY DAY 90 tablet 3   Cholecalciferol (VITAMIN D3)  10 MCG (400 UNIT) CAPS SMARTSIG:1 Capsule(s) By Mouth     ciclopirox (PENLAC) 8 % solution SMARTSIG:1 sparingly Topical Every Night     Dulaglutide (TRULICITY) 1.5 MG/0.5ML SOPN INJECT 1.5MG  (1 PEN) SUBCUTANEOUSLY EVERY WEEK 6 mL 0   GEMTESA 75 MG TABS Take 1 tablet by mouth daily.     glimepiride (AMARYL) 2 MG tablet TAKE 1/2 TABLET EVERY DAY AT DINNER 45 tablet 0   glucose blood (ACCU-CHEK GUIDE) test strip Use as instructed 100 each 12   irbesartan (AVAPRO) 300 MG tablet TAKE 1 TABLET EVERY DAY 90 tablet 3   latanoprost (XALATAN) 0.005 % ophthalmic solution SMARTSIG:In Eye(s)     metFORMIN (GLUCOPHAGE) 1000 MG tablet Take 1 tablet (1,000 mg total) by mouth 2 (two) times daily with a meal. 180 tablet 0   pioglitazone (ACTOS) 30 MG tablet TAKE 1 TABLET EVERY DAY 90 tablet 0   potassium chloride (KLOR-CON M) 10 MEQ tablet TAKE 1 TABLET EVERY DAY 90 tablet 0   pravastatin (PRAVACHOL)  40 MG tablet TAKE 1 TABLET EVERY DAY 90 tablet 3   tadalafil (CIALIS) 5 MG tablet TAKE 1 TABLET EVERY DAY AS NEEDED FOR ERECTILE DYSFUNCTION 30 tablet 0   tamsulosin (FLOMAX) 0.4 MG CAPS capsule Take 0.4 mg by mouth daily.     terbinafine (LAMISIL) 250 MG tablet SMARTSIG:1 Tablet(s) By Mouth     No current facility-administered medications on file prior to visit.    Allergies  Allergen Reactions   Atorvastatin Itching    Other reaction(s): Other, Unknown Other reaction(s): increased LFT's   Dextran Rash    Other Reaction: Not Assessed Other reaction(s): Other Other Reaction: Not Assessed Other Reaction: Not Assessed Other Reaction: Not Assessed     Assessment/Plan:  1. Hypertension -

## 2023-02-08 ENCOUNTER — Encounter: Payer: Self-pay | Admitting: Cardiology

## 2023-02-14 ENCOUNTER — Other Ambulatory Visit: Payer: Self-pay | Admitting: Endocrinology

## 2023-02-14 DIAGNOSIS — E1165 Type 2 diabetes mellitus with hyperglycemia: Secondary | ICD-10-CM

## 2023-02-22 ENCOUNTER — Encounter: Payer: Self-pay | Admitting: Podiatry

## 2023-02-22 ENCOUNTER — Ambulatory Visit: Payer: Medicare HMO | Admitting: Podiatry

## 2023-02-22 ENCOUNTER — Other Ambulatory Visit: Payer: Medicare HMO

## 2023-02-22 DIAGNOSIS — B351 Tinea unguium: Secondary | ICD-10-CM

## 2023-02-22 DIAGNOSIS — M79674 Pain in right toe(s): Secondary | ICD-10-CM | POA: Diagnosis not present

## 2023-02-22 DIAGNOSIS — M79675 Pain in left toe(s): Secondary | ICD-10-CM

## 2023-02-22 DIAGNOSIS — E1140 Type 2 diabetes mellitus with diabetic neuropathy, unspecified: Secondary | ICD-10-CM | POA: Diagnosis not present

## 2023-02-26 NOTE — Progress Notes (Signed)
  Subjective:  Patient ID: Joshua Vargas, male    DOB: 11/14/1945,  MRN: [id number]  77 y.o. male presents at risk foot care with history of diabetic neuropathy and painful elongated mycotic toenails 1-5 bilaterally which are tender when wearing enclosed shoe gear. Pain is relieved with periodic professional debridement. He would like to know when his right great toenail will grow back. Chief Complaint  Patient presents with   [facility]    [facility] BS was not taken A1c  7.1 09/01/2022 next one will be done next week with his primary care/patient has a question about his right great toe nail.   New problem(s): None   PCP is Tally Joe, MD , and last visit was June 28, 2022.  Allergies  Allergen Reactions   Atorvastatin Itching    Other reaction(s): Other, Unknown Other reaction(s): increased LFT's   Dextran Rash    Other Reaction: Not Assessed Other reaction(s): Other Other Reaction: Not Assessed Other Reaction: Not Assessed Other Reaction: Not Assessed    Review of Systems: Negative except as noted in the HPI.   Objective:  Joshua Vargas is a pleasant 77 y.o. male WD, WN in NAD. AAO x 3.  Vascular Examination: Vascular status intact b/l with palpable pedal pulses. CFT immediate b/l. Pedal hair present. No edema. No pain with calf compression b/l. Skin temperature gradient WNL b/l. No varicosities noted. No cyanosis or clubbing noted.  Neurological Examination: Sensation grossly intact b/l with 10 gram monofilament. Vibratory sensation intact b/l.  Dermatological Examination: Pedal skin with normal turgor, texture and tone b/l. No open wounds nor interdigital macerations noted. Toenails 2-5 b/l thick, discolored, elongated with subungual debris and pain on dorsal palpation. No hyperkeratotic lesions noted b/l. Right great toe nailbed healed.  Musculoskeletal Examination: Muscle strength 5/5 to b/l LE.  No pain, crepitus noted b/l. No gross pedal deformities. Patient ambulates  independently without assistive aids.   Radiographs: None  Last A1c:      Latest Ref Rng & Units 09/01/2022    8:07 AM 06/02/2022    8:13 AM  Hemoglobin A1C  Hemoglobin-A1c 4.6 - 6.5 % 7.1  7.7    Assessment:   1. Pain due to onychomycosis of toenails of both feet   2. Type 2 diabetes mellitus with diabetic neuropathy, unspecified whether long term insulin use (HCC)    Plan:  -Consent given for treatment as described below: -Examined patient. -Informed patient right great toenail will grow back in 3-4 months. -Patient to continue soft, supportive shoe gear daily. -Mycotic toenails 1-5 bilaterally were debrided in length and girth with sterile nail nippers and dremel without iatrogenic bleeding. -Patient/POA to call should there be question/concern in the interim.  Return in about 9 weeks (around 04/26/2023).  Freddie Breech, DPM      [place] LOCATION: [address] [place] [address]                   Office [phone]   [facility] LOCATION: [address] [place], [place] [address] Office [phone]

## 2023-02-28 ENCOUNTER — Ambulatory Visit: Payer: Medicare HMO | Admitting: Endocrinology

## 2023-03-20 ENCOUNTER — Telehealth: Payer: Self-pay | Admitting: *Deleted

## 2023-03-20 NOTE — Telephone Encounter (Signed)
-----   Message from Nurse Bernette Redbird sent at 03/17/2023 11:00 AM EST ----- Regarding: RE: Donnie Coffin SLEEP STUDY PER DR. Mayford Knife Looks like it was sent to [facility] for follow-up. I'm going to "done" this message, but please reach back out to the sleep team if you need further assistance!  Irving Burton ----- Message ----- From: Tarri Fuller, CMA Sent: 03/07/2023  12:29 PM EST To: Charleen Kirks, RN; Luellen Pucker, RN; # Subject: RE: Donnie Coffin SLEEP STUDY PER DR. Mayford Knife          Thank you Irving Burton.   Yes the pt has been given a device, though looks like he has not done the study yet after Coralee North has provided him with the PIN#.   Thank you Okey Regal ----- Message ----- From: Charleen Kirks, RN Sent: 03/07/2023  10:52 AM EST To: Tarri Fuller, CMA; Luellen Pucker, RN; # Subject: RE: Donnie Coffin SLEEP STUDY PER DR. Mayford Knife          Looks like [name] gave a WatchPAT to the patient on 01/02/23 and Coralee North called him with the PIN on 01/05/23. I hope this helps!  Thanks, Irving Burton ----- Message ----- From: Luellen Pucker, RN Sent: 03/07/2023  10:48 AM EST To: Tarri Fuller, CMA; Cv Div Sleep Studies Subject: RE: Donnie Coffin SLEEP STUDY PER DR. Jalene Mullet, Patient was ordered a sleep study in September, Active requests tab shows no PA needed but I don't see where the patient was ever set up? Alcario Drought, RN ----- Message ----- From: Loa Socks, LPN Sent: 1/61/0960   3:17 PM EST To: Reesa Chew, CMA; Lorelle Formosa Via, LPN; # Subject: Donnie Coffin SLEEP STUDY PER DR. Mayford Knife              Dr. Mayford Knife ordered a [facility] on this pt for known OSA  ITAMAR Home Sleep Study   Can you please pre-cert    Thanks, Lajoyce Corners

## 2023-03-20 NOTE — Telephone Encounter (Signed)
I left a message for the pt about Itamar sleep study does not yet seem be completed. Left message if he could do sleep study one night this week. If any questions call back to the sleep team [phone].

## 2023-03-24 DIAGNOSIS — Z800 Family history of malignant neoplasm of digestive organs: Secondary | ICD-10-CM | POA: Diagnosis not present

## 2023-03-24 DIAGNOSIS — K649 Unspecified hemorrhoids: Secondary | ICD-10-CM | POA: Diagnosis not present

## 2023-03-24 DIAGNOSIS — Z860101 Personal history of adenomatous and serrated colon polyps: Secondary | ICD-10-CM | POA: Diagnosis not present

## 2023-03-24 DIAGNOSIS — Z09 Encounter for follow-up examination after completed treatment for conditions other than malignant neoplasm: Secondary | ICD-10-CM | POA: Diagnosis not present

## 2023-03-31 ENCOUNTER — Telehealth: Payer: Self-pay

## 2023-03-31 NOTE — Telephone Encounter (Signed)
-----   Message from Nurse Bernette Redbird sent at 03/17/2023 11:00 AM EST ----- Regarding: RE: Joshua Vargas SLEEP STUDY PER DR. Mayford Knife Looks like it was sent to [facility] for follow-up. I'm going to "done" this message, but please reach back out to the sleep team if you need further assistance!  Irving Burton ----- Message ----- From: Tarri Fuller, CMA Sent: 03/07/2023  12:29 PM EST To: Charleen Kirks, RN; Luellen Pucker, RN; # Subject: RE: Joshua Vargas SLEEP STUDY PER DR. Mayford Knife          Thank you Irving Burton.   Yes the pt has been given a device, though looks like he has not done the study yet after Coralee North has provided him with the PIN#.   Thank you Okey Regal ----- Message ----- From: Charleen Kirks, RN Sent: 03/07/2023  10:52 AM EST To: Tarri Fuller, CMA; Luellen Pucker, RN; # Subject: RE: Joshua Vargas SLEEP STUDY PER DR. Mayford Knife          Looks like [name] gave a WatchPAT to the patient on 01/02/23 and Coralee North called him with the PIN on 01/05/23. I hope this helps!  Thanks, Irving Burton ----- Message ----- From: Luellen Pucker, RN Sent: 03/07/2023  10:48 AM EST To: Tarri Fuller, CMA; Cv Div Sleep Studies Subject: RE: Joshua Vargas SLEEP STUDY PER DR. Jalene Mullet, Patient was ordered a sleep study in September, Active requests tab shows no PA needed but I don't see where the patient was ever set up? Alcario Drought, RN ----- Message ----- From: Loa Socks, LPN Sent: 1/61/0960   3:17 PM EST To: Reesa Chew, CMA; Lorelle Formosa Via, LPN; # Subject: Joshua Vargas SLEEP STUDY PER DR. Mayford Knife              Dr. Mayford Knife ordered a [facility] on this pt for known OSA  ITAMAR Home Sleep Study   Can you please pre-cert    Thanks, Lajoyce Corners

## 2023-03-31 NOTE — Telephone Encounter (Signed)
Call to patient to check on whether he has completed home sleep study, no answer, and unable to leave message.

## 2023-04-11 DIAGNOSIS — Z860101 Personal history of adenomatous and serrated colon polyps: Secondary | ICD-10-CM | POA: Diagnosis not present

## 2023-04-11 DIAGNOSIS — K648 Other hemorrhoids: Secondary | ICD-10-CM | POA: Diagnosis not present

## 2023-04-11 DIAGNOSIS — Z09 Encounter for follow-up examination after completed treatment for conditions other than malignant neoplasm: Secondary | ICD-10-CM | POA: Diagnosis not present

## 2023-04-14 ENCOUNTER — Telehealth: Payer: Self-pay

## 2023-04-14 NOTE — Telephone Encounter (Signed)
-----   Message from Nurse Bernette Redbird sent at 03/17/2023 11:00 AM EST ----- Regarding: RE: Joshua Vargas SLEEP STUDY PER DR. Mayford Knife Looks like it was sent to [facility] for follow-up. I'm going to "done" this message, but please reach back out to the sleep team if you need further assistance!  Irving Burton ----- Message ----- From: Tarri Fuller, CMA Sent: 03/07/2023  12:29 PM EST To: Charleen Kirks, RN; Luellen Pucker, RN; # Subject: RE: Joshua Vargas SLEEP STUDY PER DR. Mayford Knife          Thank you Irving Burton.   Yes the pt has been given a device, though looks like he has not done the study yet after Coralee North has provided him with the PIN#.   Thank you Okey Regal ----- Message ----- From: Charleen Kirks, RN Sent: 03/07/2023  10:52 AM EST To: Tarri Fuller, CMA; Luellen Pucker, RN; # Subject: RE: Joshua Vargas SLEEP STUDY PER DR. Mayford Knife          Looks like [name] gave a WatchPAT to the patient on 01/02/23 and Coralee North called him with the PIN on 01/05/23. I hope this helps!  Thanks, Irving Burton ----- Message ----- From: Luellen Pucker, RN Sent: 03/07/2023  10:48 AM EST To: Tarri Fuller, CMA; Cv Div Sleep Studies Subject: RE: Joshua Vargas SLEEP STUDY PER DR. Jalene Mullet, Patient was ordered a sleep study in September, Active requests tab shows no PA needed but I don't see where the patient was ever set up? Alcario Drought, RN ----- Message ----- From: Loa Socks, LPN Sent: 1/61/0960   3:17 PM EST To: Reesa Chew, CMA; Lorelle Formosa Via, LPN; # Subject: Joshua Vargas SLEEP STUDY PER DR. Mayford Knife              Dr. Mayford Knife ordered a [facility] on this pt for known OSA  ITAMAR Home Sleep Study   Can you please pre-cert    Thanks, Lajoyce Corners

## 2023-04-14 NOTE — Telephone Encounter (Signed)
 Call to patient, no answer, left detailed message per DPR asking patient to call our office and let us know if he was able to complete home sleep study.

## 2023-04-17 ENCOUNTER — Telehealth: Payer: Self-pay | Admitting: *Deleted

## 2023-04-17 NOTE — Telephone Encounter (Signed)
-----   Message from Nurse Geni E sent at 04/14/2023  4:13 PM EST ----- Regarding: RE: PUJFJM SLEEP STUDY PER DR. SHLOMO Just keeping everyone in the loop, I have called this gentleman 2x to see if he has completed the test at home and haven't heard back from him. Geni, RN ----- Message ----- From: Gladis Porter HERO, LPN Sent: 0/76/7975   3:17 PM EST To: Dalton KANDICE Molt, CMA; Avelina HERO Via, LPN; # Subject: VERNEDA SLEEP STUDY PER DR. SHLOMO              Dr. Shlomo ordered a [facility] on this pt for known OSA  ITAMAR Home Sleep Study   Can you please pre-cert    Thanks, Porter

## 2023-04-27 ENCOUNTER — Telehealth: Payer: Self-pay

## 2023-04-27 NOTE — Telephone Encounter (Signed)
-----   Message from Nurse Bernette Redbird sent at 03/17/2023 11:00 AM EST ----- Regarding: RE: Joshua Vargas SLEEP STUDY PER DR. Mayford Knife Looks like it was sent to [facility] for follow-up. I'm going to "done" this message, but please reach back out to the sleep team if you need further assistance!  Irving Burton ----- Message ----- From: Tarri Fuller, CMA Sent: 03/07/2023  12:29 PM EST To: Charleen Kirks, RN; Luellen Pucker, RN; # Subject: RE: Joshua Vargas SLEEP STUDY PER DR. Mayford Knife          Thank you Irving Burton.   Yes the pt has been given a device, though looks like he has not done the study yet after Coralee North has provided him with the PIN#.   Thank you Okey Regal ----- Message ----- From: Charleen Kirks, RN Sent: 03/07/2023  10:52 AM EST To: Tarri Fuller, CMA; Luellen Pucker, RN; # Subject: RE: Joshua Vargas SLEEP STUDY PER DR. Mayford Knife          Looks like [name] gave a WatchPAT to the patient on 01/02/23 and Coralee North called him with the PIN on 01/05/23. I hope this helps!  Thanks, Irving Burton ----- Message ----- From: Luellen Pucker, RN Sent: 03/07/2023  10:48 AM EST To: Tarri Fuller, CMA; Cv Div Sleep Studies Subject: RE: Joshua Vargas SLEEP STUDY PER DR. Jalene Mullet, Patient was ordered a sleep study in September, Active requests tab shows no PA needed but I don't see where the patient was ever set up? Alcario Drought, RN ----- Message ----- From: Loa Socks, LPN Sent: 1/61/0960   3:17 PM EST To: Reesa Chew, CMA; Lorelle Formosa Via, LPN; # Subject: Joshua Vargas SLEEP STUDY PER DR. Mayford Knife              Dr. Mayford Knife ordered a [facility] on this pt for known OSA  ITAMAR Home Sleep Study   Can you please pre-cert    Thanks, Lajoyce Corners

## 2023-04-27 NOTE — Telephone Encounter (Signed)
Call to patient, spoke to spouse Delray Alt [facility]), advised that patient has an outstanding itamar home sleep study ordered. Advised that our records indicate that he has the device but has not completed the test. Delray Alt states she will check with patient when she gets home tonight and contact our office if patient needs assistance to set up test at home.

## 2023-05-08 ENCOUNTER — Ambulatory Visit (INDEPENDENT_AMBULATORY_CARE_PROVIDER_SITE_OTHER): Payer: Medicare HMO | Admitting: Podiatry

## 2023-05-08 ENCOUNTER — Encounter: Payer: Self-pay | Admitting: Podiatry

## 2023-05-08 VITALS — Ht 70.0 in | Wt 196.0 lb

## 2023-05-08 DIAGNOSIS — E1140 Type 2 diabetes mellitus with diabetic neuropathy, unspecified: Secondary | ICD-10-CM

## 2023-05-08 DIAGNOSIS — B351 Tinea unguium: Secondary | ICD-10-CM

## 2023-05-08 DIAGNOSIS — M79674 Pain in right toe(s): Secondary | ICD-10-CM | POA: Diagnosis not present

## 2023-05-08 DIAGNOSIS — M79675 Pain in left toe(s): Secondary | ICD-10-CM | POA: Diagnosis not present

## 2023-05-08 NOTE — Progress Notes (Signed)
  Subjective:  Patient ID: Joshua Vargas, male    DOB: 02-06-1946,  MRN: [id number]  78 y.o. male presents to clinic with  at risk foot care with history of diabetic neuropathy and painful mycotic toenails of both feet that are difficult to trim. Pain interferes with daily activities and wearing enclosed shoe gear comfortably.  Chief Complaint  Patient presents with   Nail Problem    Pt is here for [facility] last A1C was 7, PCP is Dr Azucena Cecil and LOV was 5-6 months ago.    New problem(s): None   PCP is Tally Joe, MD.  Allergies  Allergen Reactions   Atorvastatin Itching    Other reaction(s): Other, Unknown Other reaction(s): increased LFT's   Dextran Rash    Other Reaction: Not Assessed Other reaction(s): Other Other Reaction: Not Assessed Other Reaction: Not Assessed Other Reaction: Not Assessed   Review of Systems: Negative except as noted in the HPI.   Objective:  Joshua Vargas is a pleasant 78 y.o. male WD, WN in NAD. AAO x 3.  Vascular Examination: Vascular status intact b/l with palpable pedal pulses. CFT immediate b/l. No edema. No pain with calf compression b/l. Skin temperature gradient WNL b/l. No ischemia or gangrene noted b/l LE. No cyanosis or clubbing noted b/l LE.  Neurological Examination: Sensation grossly intact b/l with 10 gram monofilament.  Dermatological Examination: Pedal skin with normal turgor, texture and tone b/l. Toenails 1-5 b/l thick, discolored, elongated with subungual debris and pain on dorsal palpation. No corns, calluses nor porokeratotic lesions noted.  Musculoskeletal Examination: Muscle strength 5/5 to b/l LE. No pain, crepitus or joint limitation noted with ROM bilateral LE. No gross bony deformities bilaterally.  Radiographs: None  Last A1c:      Latest Ref Rng & Units 09/01/2022    8:07 AM 06/02/2022    8:13 AM  Hemoglobin A1C  Hemoglobin-A1c 4.6 - 6.5 % 7.1  7.7    Assessment:   1. Pain due to onychomycosis of toenails of  both feet   2. Type 2 diabetes mellitus with diabetic neuropathy, unspecified whether long term insulin use (HCC)    Plan:  Patient was evaluated and treated. All patient's and/or POA's questions/concerns addressed on today's visit. Toenails 1-5 debrided in length and girth without incident. Continue soft, supportive shoe gear daily. Report any pedal injuries to medical professional. Call office if there are any questions/concerns. -Continue foot and shoe inspections daily. Monitor blood glucose per PCP/Endocrinologist's recommendations. -Patient/POA to call should there be question/concern in the interim.  Return in about 9 weeks (around 07/10/2023).  Freddie Breech, DPM      [place] LOCATION: [address] [place] [address]                   Office [phone]   [facility] LOCATION: [address] [place], [place] [address] Office [phone]

## 2023-05-10 ENCOUNTER — Encounter: Payer: Self-pay | Admitting: Endocrinology

## 2023-05-10 ENCOUNTER — Ambulatory Visit: Payer: Medicare HMO | Admitting: Endocrinology

## 2023-05-10 VITALS — BP 120/70 | HR 80 | Resp 20 | Ht 70.0 in | Wt 200.8 lb

## 2023-05-10 DIAGNOSIS — Z7984 Long term (current) use of oral hypoglycemic drugs: Secondary | ICD-10-CM | POA: Diagnosis not present

## 2023-05-10 DIAGNOSIS — D352 Benign neoplasm of pituitary gland: Secondary | ICD-10-CM

## 2023-05-10 DIAGNOSIS — Z7985 Long-term (current) use of injectable non-insulin antidiabetic drugs: Secondary | ICD-10-CM

## 2023-05-10 DIAGNOSIS — E782 Mixed hyperlipidemia: Secondary | ICD-10-CM

## 2023-05-10 DIAGNOSIS — E1165 Type 2 diabetes mellitus with hyperglycemia: Secondary | ICD-10-CM | POA: Diagnosis not present

## 2023-05-10 LAB — POCT GLYCOSYLATED HEMOGLOBIN (HGB A1C): Hemoglobin A1C: 7.8 % — AB (ref 4.0–5.6)

## 2023-05-10 MED ORDER — PIOGLITAZONE HCL 30 MG PO TABS: 30.0000 mg | ORAL_TABLET | Freq: Every day | ORAL | 3 refills | Status: DC

## 2023-05-10 MED ORDER — TRULICITY 1.5 MG/0.5ML ~~LOC~~ SOAJ: 1.5000 mg | SUBCUTANEOUS | 3 refills | Status: DC

## 2023-05-10 MED ORDER — GLIMEPIRIDE 2 MG PO TABS: ORAL_TABLET | ORAL | 3 refills | Status: DC

## 2023-05-10 MED ORDER — METFORMIN HCL 1000 MG PO TABS: 1000.0000 mg | ORAL_TABLET | Freq: Two times a day (BID) | ORAL | 3 refills | Status: DC

## 2023-05-10 MED ORDER — TRULICITY 0.75 MG/0.5ML ~~LOC~~ SOAJ
SUBCUTANEOUS | 0 refills | Status: DC
Start: 2023-05-10 — End: 2023-08-10

## 2023-05-10 MED ORDER — PRAVASTATIN SODIUM 40 MG PO TABS: 40.0000 mg | ORAL_TABLET | Freq: Every day | ORAL | 3 refills | Status: DC

## 2023-05-10 NOTE — Progress Notes (Signed)
Outpatient Endocrinology Note Joshua Zahir Eisenhour, MD   Patient's Name: Joshua Vargas    DOB: 1945-11-11    MRN: [id number]                                                    REASON OF VISIT: Follow up for type 2 diabetes mellitus  PCP: Tally Joe, MD  HISTORY OF PRESENT ILLNESS:   Joshua Vargas is a 78 y.o. old Vargas with past medical history listed below, is here for follow up for type 2 diabetes mellitus.   Pertinent Diabetes History: Patient was previously seen by Dr. Lucianne Muss and was last time seen in February 2024.  Patient was diagnosed with type 2 diabetes mellitus in 2002.  Patient has uncontrolled type 2 diabetes mellitus.  No personal history of pancreatitis and / or family history of medullary thyroid carcinoma or MEN 2B syndrome.   Chronic Diabetes Complications : Retinopathy: no. Last ophthalmology exam was done on 10/2022, annually, following with ophthalmology regularly.  Nephropathy: ? CKD, on ACE/ARB / irbesartan Peripheral neuropathy: mild yes, tingling  Coronary artery disease: yes Stroke: no  Relevant comorbidities and cardiovascular risk factors: Obesity: no Body mass index is 28.81 kg/m.  Hypertension: Yes  Hyperlipidemia : Yes, on statin  Lipids: He had been on Crestor and simvastatin and these were stopped because of increased liver functions Because of significantly high lipids he has been on 40 mg pravastatin  LDL has been well controlled.  Current / Home Diabetic regimen includes:  Metformin 1000 mg 2 times a day. Amaryl 2 mg daily. Actos 30 mg daily.  Prior diabetic medications: Trulicity up to 1.5 mg weekly in the past, stopped due to not covered by insurance at that time, due to donut hole.  He used to be on Victoza switched to Trulicity.  Nausea with Victoza 1.2 mg in the past.  Glycemic data:   He has been checking blood sugar occasionally rarely up to 70s and sometime up to 200 range.  Did not bring glucometer in the clinic  today.  Hypoglycemia: Patient has no hypoglycemic episodes. Patient has hypoglycemia awareness.  Factors modifying glucose control: 1.  Diabetic diet assessment: 3 meals a day.  2.  Staying active or exercising:   3.  Medication compliance: compliant most of the time.  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 He has been taken off his medication, previously because of his noncompliance with this prolactin consistently normal even without cabergoline.  He had taken cabergoline onto the beginning of 2022.  Testosterone level has been normal with normalization of prolactin  Interval history  Diabetes regiment been reviewed and as noted above.  He is mostly compliant with diabetic medication occasionally misses them.  He is no longer taking Trulicity for more than a year.  Occasional mild tingling and numbness of the feet.  No other complaints today.  No new headache or vision problem.  No galactorrhea.  REVIEW OF SYSTEMS As per history of present illness.   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"   History of colonic polyps    Hyperlipemia    Hypertension    Knee pain    Memory loss    Paresthesia    Pneumonia 1990s X 1; ~ 2000 X 1   Prolactinoma (HCC)    Type II diabetes mellitus (HCC)    Vitreous floaters of right eye     PAST SURGICAL HISTORY: Past Surgical History:  Procedure Laterality Date   CARDIAC CATHETERIZATION  ?2006; 01/25/2018   ELBOW SURGERY Left    "something to do w/the muscle"   JOINT REPLACEMENT     LEFT HEART CATH AND CORONARY ANGIOGRAPHY N/A 01/26/2018   Procedure: LEFT HEART CATH AND CORONARY  ANGIOGRAPHY;  [place]: [place], Peter M, MD;  Location: [facility];  Service: Cardiovascular;  Laterality: N/A;   PICC LINE INSERTION  ~ 2005   "took it out months later"   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"   TOTAL KNEE ARTHROPLASTY Left 2005   TRIGGER FINGER RELEASE Bilateral ~ 2016    ALLERGIES: Allergies  Allergen Reactions   Atorvastatin Itching    Other reaction(s): Other, Unknown Other reaction(s): increased LFT's   Dextran Rash    Other Reaction: Not Assessed Other reaction(s): Other Other Reaction: Not Assessed Other Reaction: Not Assessed Other Reaction: Not Assessed    FAMILY HISTORY:  Family History  Problem Relation Age of Onset   Cancer Father    Diabetes Brother    Heart attack Brother 40   Allergic rhinitis Neg Hx    Angioedema Neg Hx    Asthma Neg Hx    Atopy Neg Hx    Eczema Neg Hx    Immunodeficiency Neg Hx    Urticaria Neg Hx     SOCIAL HISTORY: Social History   Socioeconomic History   Marital status: Married    Spouse name: Not on file   Number of children: Not on file   Years of education: Not on file   Highest education level: Not on file  Occupational History   Not on file  Tobacco Use   Smoking status: Never   Smokeless tobacco: Never  Vaping Use   Vaping status: Never Used  Substance and Sexual Activity   Alcohol use: Never   Drug use: Never   Sexual activity: Yes  Other Topics Concern   Not on file  Social History Narrative   Not on file   Social Drivers of Health   Financial Resource Strain: Low Risk  (02/15/2022)   Received from [facility], [facility]   Overall Financial Resource Strain (CARDIA)    Difficulty of Paying Living Expenses: Not very hard  Food Insecurity: No Food Insecurity (02/15/2022)   [facility], [facility]   Hunger Vital Sign    Worried About Running Out of Food in the Last Year: Never true    Ran Out of Food in the Last Year: Never true   Transportation Needs: Not on file  Physical Activity: Sufficiently Active (02/15/2022)   Received from [facility], [facility]   Exercise Vital Sign    Days of Exercise per Week: 4 days    Minutes of Exercise per Session: 60 min  Stress: No Stress Concern Present (02/15/2022)   Received from [facility], [facility]   [employer] of Occupational Health - Occupational Stress Questionnaire    Feeling of  Stress : Not at all  Social Connections: Socially Integrated (02/15/2022)   Received from [facility], [facility]   Social Network    How would you rate your social network (family, work, friends)?: Good participation with social networks    MEDICATIONS:  Current Outpatient Medications  Medication Sig Dispense Refill   acetaminophen (TYLENOL) 500 MG tablet Take 500 mg by mouth as needed. pain     albuterol (VENTOLIN HFA) 108 (90 Base) MCG/ACT inhaler Inhale 1-2 puffs into the lungs every 6 (six) hours as needed for wheezing or shortness of breath. 1 each 0   amLODipine (NORVASC) 10 MG tablet TAKE 1 TABLET EVERY DAY 90 tablet 3   aspirin 81 MG EC tablet Take 81 mg by mouth daily.     augmented betamethasone dipropionate (DIPROLENE-AF) 0.05 % cream APPLY SPARINGLY TO AFFECTED AREA TWICE A DAY     Blood Glucose Monitoring Suppl (ACCU-CHEK GUIDE) w/Device KIT 1 Device by Does not apply route daily. 1 kit 0   carvedilol (COREG) 12.5 MG tablet Take 1 tablet (12.5 mg total) by mouth 2 (two) times daily. 180 tablet 2   chlorthalidone (HYGROTON) 25 MG tablet TAKE 1 TABLET EVERY DAY 90 tablet 3   Cholecalciferol (VITAMIN D3) 10 MCG (400 UNIT) CAPS SMARTSIG:1 Capsule(s) By Mouth     ciclopirox (PENLAC) 8 % solution SMARTSIG:1 sparingly Topical Every Night     Dulaglutide (TRULICITY) 0.75 MG/0.5ML SOAJ Inject 0.75 mg in am weekly under skin 2 mL 0   Dulaglutide (TRULICITY) 1.5 MG/0.5ML SOAJ Inject 1.5 mg into the skin once a week. After completion of 4 weeks of 0.75 mcg. 6 mL 3    GEMTESA 75 MG TABS Take 1 tablet by mouth daily.     glucose blood (ACCU-CHEK GUIDE) test strip Use as instructed 100 each 12   irbesartan (AVAPRO) 300 MG tablet TAKE 1 TABLET EVERY DAY 90 tablet 3   latanoprost (XALATAN) 0.005 % ophthalmic solution SMARTSIG:In Eye(s)     potassium chloride (KLOR-CON M) 10 MEQ tablet TAKE 1 TABLET EVERY DAY 90 tablet 0   tadalafil (CIALIS) 5 MG tablet TAKE 1 TABLET EVERY DAY AS NEEDED FOR ERECTILE DYSFUNCTION 30 tablet 0   tamsulosin (FLOMAX) 0.4 MG CAPS capsule Take 0.4 mg by mouth daily.     terbinafine (LAMISIL) 250 MG tablet SMARTSIG:1 Tablet(s) By Mouth     glimepiride (AMARYL) 2 MG tablet TAKE 1/2 TABLET EVERY DAY AT DINNER 90 tablet 3   metFORMIN (GLUCOPHAGE) 1000 MG tablet Take 1 tablet (1,000 mg total) by mouth 2 (two) times daily with a meal. 180 tablet 3   pioglitazone (ACTOS) 30 MG tablet Take 1 tablet (30 mg total) by mouth daily. 90 tablet 3   pravastatin (PRAVACHOL) 40 MG tablet Take 1 tablet (40 mg total) by mouth daily. 90 tablet 3   No current facility-administered medications for this visit.    PHYSICAL EXAM: Vitals:   05/10/23 0826  BP: 120/70  Pulse: 80  Resp: 20  SpO2: 96%  Weight: 200 lb 12.8 oz (91.1 kg)  Height: 5\' 10"  (1.778 m)   Body mass index is 28.81 kg/m.  Wt Readings from Last 3 Encounters:  05/10/23 200 lb 12.8 oz (91.1 kg)  05/08/23 196 lb (88.9 kg)  01/02/23 196 lb (88.9 kg)    General: Well developed, well nourished Vargas in no apparent distress.  HEENT: AT/[place], no external lesions.  Eyes: Conjunctiva clear and no icterus. Neck: Neck supple  Lungs: Respirations not labored  Neurologic: Alert, oriented, normal speech Extremities / Skin: Dry. No sores or rashes noted.  Psychiatric: Does not appear depressed or anxious  Diabetic Foot Exam - Simple   Simple Foot Form Diabetic Foot exam was performed with the following findings: Yes 05/10/2023  8:43 AM  Visual Inspection No deformities, no ulcerations, no other  skin breakdown bilaterally: Yes See comments: Yes Sensation Testing Intact to touch and monofilament testing bilaterally: Yes Pulse Check Posterior Tibialis and Dorsalis pulse intact bilaterally: Yes Comments Dystrophic nails +    LABS Reviewed Lab Results  Component Value Date   HGBA1C 7.8 (A) 05/10/2023   HGBA1C 7.1 (H) 09/01/2022   HGBA1C 7.7 (H) 06/02/2022   Lab Results  Component Value Date   FRUCTOSAMINE 259 11/28/2017   FRUCTOSAMINE 261 07/13/2016   Lab Results  Component Value Date   CHOL 153 09/01/2022   HDL 47.80 09/01/2022   LDLCALC 57 02/01/2022   LDLDIRECT 60.0 09/01/2022   TRIG 251.0 (H) 09/01/2022   CHOLHDL 3 09/01/2022   Lab Results  Component Value Date   MICRALBCREAT 1.5 09/01/2022   MICRALBCREAT 1.2 09/24/2021   Lab Results  Component Value Date   CREATININE 1.22 09/01/2022   Lab Results  Component Value Date   GFR 57.66 (L) 09/01/2022    ASSESSMENT / PLAN  1. Uncontrolled type 2 diabetes mellitus with hyperglycemia, without long-term current use of insulin (HCC)   2. Prolactinoma (HCC)   3. Mixed hyperlipidemia     Diabetes Mellitus type 2, complicated by mild neuropathy, CAD, ?  CKD. - Diabetic status / severity: Uncontrolled.  Lab Results  Component Value Date   HGBA1C 7.8 (A) 05/10/2023    - Hemoglobin A1c goal : <7%  Patient has not taken Trulicity for more than a year, he likes to restart.  He wants to take Trulicity and does not want to try other GLP-1 receptor agonist.  Adjusted diabetes regimen as follows.  - Medications:   Continue pioglitazone/Actos 30 mg daily. Continue Amaryl/glimepiride 2 mg daily.  Take with meal. Continue metformin 1000 mg 2 times a day.  Better to take with meal. Start Trulicity 0.75 mg weekly for 4 weeks and increase to 1.5 mg weekly.  Once you are on Trulicity 1.5 mg weekly you can decrease the dose of glimepiride to half tablet/1 mg daily.  - Home glucose testing: Check in the morning  fasting and at bedtime.  Patient has glucometer and test supplies. - Discussed/ Gave Hypoglycemia treatment plan.  # Consult : not required at this time.   # Annual urine for microalbuminuria/ creatinine ratio, no microalbuminuria currently, continue ACE/ARB /irbesartan. Last  Lab Results  Component Value Date   MICRALBCREAT 1.5 09/01/2022    # Foot check nightly / neuropathy.  # Annual dilated diabetic eye exams.   - Diet: Make healthy diabetic food choices - Life style / activity / exercise: Discussed.  2. Blood pressure  -  BP Readings from Last 1 Encounters:  05/10/23 120/70    - Control is in target.  - No change in current plans.  3. Lipid status / Hyperlipidemia - Last  Lab Results  Component Value Date   LDLCALC 57 02/01/2022   - Continue pravastatin 40 mg daily.  # Prolactinoma/hyperprolactinemia -He was diagnosed with prolactinoma in 2007.  He was on cabergoline until beginning of 2022.  He was not fully compliant in the past.  His prolactin has remained normal without cabergoline. -Check prolactin level in next 8 of lab.  Diagnoses and all orders for this visit:  Uncontrolled type 2 diabetes mellitus with hyperglycemia, without long-term current use of insulin (HCC) -     POCT glycosylated hemoglobin (Hb A1C) -     Dulaglutide (TRULICITY) 0.75 MG/0.5ML SOAJ; Inject 0.75 mg in am weekly under skin -     Dulaglutide (TRULICITY) 1.5 MG/0.5ML SOAJ; Inject 1.5 mg into the skin once a week. After completion of 4 weeks of 0.75 mcg. -     Hemoglobin A1c -     BASIC METABOLIC PANEL WITH GFR -     Microalbumin / creatinine urine ratio -     Lipid panel -     glimepiride (AMARYL) 2 MG tablet; TAKE 1/2 TABLET EVERY DAY AT DINNER -     metFORMIN (GLUCOPHAGE) 1000 MG tablet; Take 1 tablet (1,000 mg total) by mouth 2 (two) times daily with a meal. -     pioglitazone (ACTOS) 30 MG tablet; Take 1 tablet (30 mg total) by mouth daily.  Prolactinoma (HCC) -      Prolactin  Mixed hyperlipidemia -     pravastatin (PRAVACHOL) 40 MG tablet; Take 1 tablet (40 mg total) by mouth daily.    DISPOSITION Follow up in clinic in 3 months suggested.  Labs prior to follow-up visit.   All questions answered and patient verbalized understanding of the plan.  Joshua Kalila Adkison, MD [facility] [address] [place], [address] [place], [place] [address] Phone # [phone]  At least part of this note was generated using voice recognition software. Inadvertent word errors may have occurred, which were not recognized during the proofreading process.

## 2023-05-10 NOTE — Patient Instructions (Addendum)
Diabetes regimen  Continue pioglitazone/Actos 30 mg daily. Continue Amaryl/glimepiride 2 mg daily.  Take with meal. Continue metformin 1000 mg 2 times a day.  Better to take with meal. Start Trulicity 0.75 mg weekly for 4 weeks and increase to 1.5 mg weekly.  Once you are on Trulicity 1.5 mg weekly you can decrease the dose of glimepiride to half tablet/1 mg daily.

## 2023-05-22 NOTE — Telephone Encounter (Signed)
 Mychart message to patient asking him to perform home Itamar sleep study.

## 2023-06-07 NOTE — Progress Notes (Deleted)
 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 male with a PMH of CAD s/p LHC 2019 with 70% first diagonal stenosis and 25% proximal LAD, HLD, HTN, DM, OSA presents today for 38-month follow-up.  Joshua Vargas was seen initially in 2019 for evaluation of chest pain.  He had previously undergone a left heart cath 2006 that showed disease in the diagonal.  During visit patient endorsed left anterior chest pain that occurred at which radiated to the left arm.  He underwent an LHC on 01/2018 that showed 70% first diagonal stenosis with 25% proximal LAD normal EF.  Patient was started on medical management with ASA 81 mg.  He was seen in outpatient on 05/2021 by Dr. Mayford Knife for evaluation of snoring and hypertension.  He was found to have elevated BP in the setting of intermittent noncompliance and was started on irbesartan with addition of chlorthalidone.  He was also evaluated for sleep apnea and found to have mild OSA with advisement to wear CPAP.Marland Kitchen  He also endorsed occasional chest pain and underwent Lexiscan Myoview 06/01/2021 that showed no ischemia and was low risk.  He was referred to hypertension clinic for further evaluation of BP.   He was last seen by Dr. Mayford Knife on 01/02/2023 and reported doing well continued to have chest pain at times when exerting himself.  He also endorses some dizzy dizzy spells which may be related to balance problems.  Blood pressure during visit was well-controlled and carvedilol was increased to 12.5 twice daily.   During today's visit the patient reports*** .  Patient denies chest pain, palpitations, dyspnea, PND, orthopnea, nausea, vomiting, dizziness, syncope, edema, weight gain, or early satiety.  ***Notes: History of noncompliance with BP -Last ischemic evaluation: -Last echo: -Interim ED visits: Review of Systems  Please see the history of present illness.    All other systems reviewed and are otherwise negative except as noted above.  Physical Exam    Wt Readings from Last 3 Encounters:  05/10/23 200 lb 12.8 oz (91.1 kg)  05/08/23 196 lb (88.9 kg)  01/02/23 196 lb (88.9 kg)   WJ:XBJYN were no vitals filed for this visit.,There is no height or weight on file to calculate BMI. GEN: Well nourished, well developed in no acute distress Neck: No JVD; No carotid bruits Pulmonary: Clear to auscultation without rales, wheezing or rhonchi  Cardiovascular: Normal rate. Regular rhythm. Normal S1. Normal S2.   Murmurs: There is no murmur.  ABDOMEN: Soft, non-tender, non-distended EXTREMITIES:  No edema; No deformity   EKG/LABS/ Recent Cardiac  Studies   ECG personally reviewed by me today - ***  Risk Assessment/Calculations:   {Does this patient have ATRIAL FIBRILLATION?:[id number]}  STOP-Bang Score:  5  { Consider Dx Sleep Disordered Breathing or Sleep Apnea  ICD G47.33          :1}    Lab Results  Component Value Date   WBC 6.5 04/05/2021   HGB 13.8 04/05/2021   HCT 42.5 04/05/2021   MCV 83.3 04/05/2021   PLT 217 04/05/2021   Lab Results  Component Value Date   CREATININE 1.22 09/01/2022   BUN 18 09/01/2022   NA 140 09/01/2022   K 3.7 09/01/2022   CL 102 09/01/2022   CO2  30 09/01/2022   Lab Results  Component Value Date   CHOL 153 09/01/2022   HDL 47.80 09/01/2022   LDLCALC 57 02/01/2022   LDLDIRECT 60.0 09/01/2022   TRIG 251.0 (H) 09/01/2022   CHOLHDL 3 09/01/2022    Lab Results  Component Value Date   HGBA1C 7.8 (A) 05/10/2023   Assessment & Plan    1.  History of CAD: -s/p LHC completed 2019 with 70% ostial stenosis and 25% LAD with Myoview completed 2023 that was low risk and normal -Today patient reports***  2.  Primary HTN:  3.  Hyperlipidemia:  4.  History of OSA:  5.  Diabetes type 2:      Disposition: Follow-up with Armanda Magic, MD or APP in *** months {Are you ordering a CV Procedure (e.g. stress test, cath, DCCV, TEE, etc)?   Press F2        :[id number]}   Signed, Napoleon Form, Leodis Rains, NP 06/07/2023, 11:47 AM [facility]

## 2023-06-09 ENCOUNTER — Ambulatory Visit: Payer: Medicare HMO | Attending: Nurse Practitioner | Admitting: Nurse Practitioner

## 2023-06-09 DIAGNOSIS — I2510 Atherosclerotic heart disease of native coronary artery without angina pectoris: Secondary | ICD-10-CM

## 2023-06-09 DIAGNOSIS — I10 Essential (primary) hypertension: Secondary | ICD-10-CM

## 2023-06-09 DIAGNOSIS — G4733 Obstructive sleep apnea (adult) (pediatric): Secondary | ICD-10-CM

## 2023-06-09 DIAGNOSIS — E785 Hyperlipidemia, unspecified: Secondary | ICD-10-CM

## 2023-06-09 DIAGNOSIS — E1159 Type 2 diabetes mellitus with other circulatory complications: Secondary | ICD-10-CM

## 2023-07-03 ENCOUNTER — Ambulatory Visit (INDEPENDENT_AMBULATORY_CARE_PROVIDER_SITE_OTHER): Payer: Medicare HMO | Admitting: Podiatry

## 2023-07-03 DIAGNOSIS — Z91198 Patient's noncompliance with other medical treatment and regimen for other reason: Secondary | ICD-10-CM

## 2023-07-03 NOTE — Progress Notes (Signed)
 1. Failure to attend appointment with reason given    Patient has appt scheduled next week.

## 2023-07-10 ENCOUNTER — Ambulatory Visit: Payer: Medicare HMO | Admitting: Podiatry

## 2023-07-10 ENCOUNTER — Encounter: Payer: Self-pay | Admitting: Podiatry

## 2023-07-10 VITALS — Ht 70.0 in | Wt 200.8 lb

## 2023-07-10 DIAGNOSIS — B351 Tinea unguium: Secondary | ICD-10-CM

## 2023-07-10 DIAGNOSIS — M79675 Pain in left toe(s): Secondary | ICD-10-CM | POA: Diagnosis not present

## 2023-07-10 DIAGNOSIS — E1140 Type 2 diabetes mellitus with diabetic neuropathy, unspecified: Secondary | ICD-10-CM | POA: Diagnosis not present

## 2023-07-10 DIAGNOSIS — M79674 Pain in right toe(s): Secondary | ICD-10-CM | POA: Diagnosis not present

## 2023-07-10 NOTE — Progress Notes (Signed)
  Subjective:  Patient ID: Joshua Vargas, male    DOB: 02/07/1946,  MRN: [id number]  78 y.o. male presents to clinic with  at risk foot care with history of diabetic neuropathy and painful mycotic toenails x 10 which interfere with daily activities. Pain is relieved with periodic professional debridement.  Chief Complaint  Patient presents with   Nail Problem    Pt is here for [facility] last A1C was 5.8 PCP is Dr Azucena Cecil and LOV was in October.   New problem(s): None   PCP is Tally Joe, MD.  Allergies  Allergen Reactions   Atorvastatin Itching    Other reaction(s): Other, Unknown Other reaction(s): increased LFT's   Dextran Rash    Other Reaction: Not Assessed Other reaction(s): Other Other Reaction: Not Assessed Other Reaction: Not Assessed Other Reaction: Not Assessed   Review of Systems: Negative except as noted in the HPI.   Objective:  Joshua Vargas is a pleasant 78 y.o. male WD, WN in NAD. AAO x 3.  Vascular Examination: Vascular status intact b/l with palpable pedal pulses. CFT immediate b/l. No edema. No pain with calf compression b/l. Skin temperature gradient WNL b/l. No ischemia or gangrene noted b/l LE. No cyanosis or clubbing noted b/l LE.  Neurological Examination: Sensation grossly intact b/l with 10 gram monofilament.  Dermatological Examination: Pedal skin with normal turgor, texture and tone b/l. Toenails 1-5 b/l thick, discolored, elongated with subungual debris and pain on dorsal palpation. No corns, calluses nor porokeratotic lesions noted.  Musculoskeletal Examination: Muscle strength 5/5 to b/l LE. No pain, crepitus or joint limitation noted with ROM bilateral LE. No gross bony deformities bilaterally. Utilizes cane for ambulation assistance.  Radiographs: None  Last A1c:      Latest Ref Rng & Units 05/10/2023    8:27 AM 09/01/2022    8:07 AM  Hemoglobin A1C  Hemoglobin-A1c 4.0 - 5.6 % 7.8  7.1      Assessment:   1. Pain due to  onychomycosis of toenails of both feet   2. Type 2 diabetes mellitus with diabetic neuropathy, unspecified whether long term insulin use (HCC)    Plan:  Patient was evaluated and treated. All patient's and/or POA's questions/concerns addressed on today's visit. Mycotic toenails 1-5 debrided in length and girth without incident.  Continue daily foot inspections and monitor blood glucose per PCP/Endocrinologist's recommendations.Continue soft, supportive shoe gear daily. Report any pedal injuries to medical professional. Call office if there are any quesitons/concerns. -Patient/POA to call should there be question/concern in the interim.  Return in about 9 weeks (around 09/11/2023).  Freddie Breech, DPM      [facility] LOCATION: [address], [place] [address]                   Office [phone]   [facility] LOCATION: [address] [place], [place] [address] Office [phone]

## 2023-07-14 NOTE — Telephone Encounter (Signed)
 Spoke with patient. He states that he does not plan to proceed with Itamar and that the box is still unopened. Explained that he has 30 days to return the test. Pt agrees to return it next week. He denies additional questions at this time.

## 2023-07-18 ENCOUNTER — Other Ambulatory Visit: Payer: Self-pay

## 2023-07-26 ENCOUNTER — Telehealth: Payer: Self-pay | Admitting: *Deleted

## 2023-07-26 NOTE — Telephone Encounter (Signed)
 Hi Mr. Woolverton,   Mrs. Benedetti dropped the sleep device back off to the office. I have un-registered the device; see below.   Thank you Sheryle Donning    01-04-2023 08:17:09 [id number] WP1 Turner Traci WP1 Unregistered

## 2023-08-07 ENCOUNTER — Other Ambulatory Visit: Payer: Medicare HMO

## 2023-08-07 DIAGNOSIS — D352 Benign neoplasm of pituitary gland: Secondary | ICD-10-CM | POA: Diagnosis not present

## 2023-08-07 DIAGNOSIS — E1165 Type 2 diabetes mellitus with hyperglycemia: Secondary | ICD-10-CM | POA: Diagnosis not present

## 2023-08-08 ENCOUNTER — Ambulatory Visit: Payer: Medicare HMO | Admitting: Podiatry

## 2023-08-08 ENCOUNTER — Encounter: Payer: Self-pay | Admitting: Endocrinology

## 2023-08-08 LAB — LIPID PANEL
Cholesterol: 186 mg/dL (ref <200)
HDL: 58 mg/dL (ref >=40)
LDL Cholesterol (Calc): 96 mg/dL
Non-HDL Cholesterol (Calc): 128 mg/dL (ref <130)
Total CHOL/HDL Ratio: 3.2 (calc) (ref <5.0)
Triglycerides: 228 mg/dL — ABNORMAL HIGH (ref <150)

## 2023-08-08 LAB — BASIC METABOLIC PANEL WITH GFR
BUN: 16 mg/dL (ref 7–25)
CO2: 31 mmol/L (ref 20–32)
Calcium: 9.7 mg/dL (ref 8.6–10.3)
Chloride: 98 mmol/L (ref 98–110)
Creat: 1.21 mg/dL (ref 0.70–1.28)
Glucose, Bld: 128 mg/dL — ABNORMAL HIGH (ref 65–99)
Potassium: 3.6 mmol/L (ref 3.5–5.3)
Sodium: 140 mmol/L (ref 135–146)
eGFR: 62 mL/min/{1.73_m2} (ref >=60)

## 2023-08-08 LAB — PROLACTIN: Prolactin: 8.9 ng/mL (ref 2.0–18.0)

## 2023-08-08 LAB — HEMOGLOBIN A1C
Hgb A1c MFr Bld: 8.8 % — ABNORMAL HIGH (ref <5.7)
Mean Plasma Glucose: 206 mg/dL
eAG (mmol/L): 11.4 mmol/L

## 2023-08-08 LAB — MICROALBUMIN / CREATININE URINE RATIO
Creatinine, Urine: 184 mg/dL (ref 20–320)
Microalb Creat Ratio: 33 mg/g{creat} — ABNORMAL HIGH (ref <30)
Microalb, Ur: 6.1 mg/dL

## 2023-08-10 ENCOUNTER — Encounter: Payer: Self-pay | Admitting: Endocrinology

## 2023-08-10 ENCOUNTER — Ambulatory Visit: Payer: Medicare HMO | Admitting: Endocrinology

## 2023-08-10 VITALS — BP 150/80 | Resp 20 | Ht 70.0 in | Wt 202.4 lb

## 2023-08-10 DIAGNOSIS — E1165 Type 2 diabetes mellitus with hyperglycemia: Secondary | ICD-10-CM

## 2023-08-10 DIAGNOSIS — D352 Benign neoplasm of pituitary gland: Secondary | ICD-10-CM | POA: Diagnosis not present

## 2023-08-10 DIAGNOSIS — Z7985 Long-term (current) use of injectable non-insulin antidiabetic drugs: Secondary | ICD-10-CM | POA: Diagnosis not present

## 2023-08-10 DIAGNOSIS — Z7984 Long term (current) use of oral hypoglycemic drugs: Secondary | ICD-10-CM

## 2023-08-10 MED ORDER — PIOGLITAZONE HCL 30 MG PO TABS: 30.0000 mg | ORAL_TABLET | Freq: Every day | ORAL | 3 refills | Status: DC

## 2023-08-10 MED ORDER — GLIMEPIRIDE 2 MG PO TABS: ORAL_TABLET | ORAL | 3 refills | Status: DC

## 2023-08-10 MED ORDER — BLOOD GLUCOSE TEST VI STRP: 1.0000 | ORAL_STRIP | Freq: Every day | 3 refills | Status: AC

## 2023-08-10 MED ORDER — TRULICITY 0.75 MG/0.5ML ~~LOC~~ SOAJ: SUBCUTANEOUS | 0 refills | Status: DC

## 2023-08-10 MED ORDER — BLOOD GLUCOSE MONITORING SUPPL DEVI: 1.0000 | Freq: Three times a day (TID) | 0 refills | Status: AC

## 2023-08-10 MED ORDER — EMPAGLIFLOZIN 25 MG PO TABS: 25.0000 mg | ORAL_TABLET | Freq: Every day | ORAL | 3 refills | Status: DC

## 2023-08-10 MED ORDER — LANCET DEVICE MISC: 1.0000 | Freq: Three times a day (TID) | 0 refills | Status: AC

## 2023-08-10 MED ORDER — LANCETS MISC. MISC: 1.0000 | Freq: Every day | 3 refills | Status: AC

## 2023-08-10 MED ORDER — TRULICITY 1.5 MG/0.5ML ~~LOC~~ SOAJ: 1.5000 mg | SUBCUTANEOUS | 3 refills | Status: DC

## 2023-08-10 NOTE — Patient Instructions (Addendum)
 Latest Reference Range & Units 09/01/22 08:07 05/10/23 08:27 08/07/23 08:07  Hemoglobin A1C <5.7 % 7.1 (H) 7.8 ! Pend 8.8 (H)  (H): Data is abnormally high !: Data is abnormal  Continue metformin  1000 mg 2 times a day. Continue glimepiride /Amaryl  2 mg 1 tablet daily. Continue Actos /pioglitazone  30 mg 1 tablet daily.   Start Jardiance  25 mg daily.  This is a diabetes medicine also helps for kidneys.  Start Trulicity  0.75 mg weekly for 4 weeks and increase to 1.5 mg weekly.  I have sent prescription of new medication including glucometer to the [facility]  Check blood sugar in the morning fasting before breakfast every day and occasionally at bedtime.

## 2023-08-10 NOTE — Progress Notes (Signed)
 Outpatient Endocrinology Note [place] Markala Sitts, MD   Patient's Name: Joshua Vargas    DOB: 1945-11-26    MRN: [id number]                                                    REASON OF VISIT: Follow up for type 2 diabetes mellitus  PCP: Rae Bugler, MD  HISTORY OF PRESENT ILLNESS:   Joshua Vargas is a 78 y.o. old male with past medical history listed below, is here for follow up for type 2 diabetes mellitus.   Pertinent Diabetes History: Patient was previously seen by Dr. Hubert Madden and was last time seen in February 2024.  Patient was diagnosed with type 2 diabetes mellitus in 2002.  Patient has uncontrolled type 2 diabetes mellitus.  No personal history of pancreatitis and / or family history of medullary thyroid  carcinoma or MEN 2B syndrome.   Chronic Diabetes Complications : Retinopathy: no. Last ophthalmology exam was done on 10/2022, annually, following with ophthalmology regularly.  Nephropathy: ? CKD, on ACE/ARB / irbesartan  Peripheral neuropathy: mild yes, tingling  Coronary artery disease: yes Stroke: no  Relevant comorbidities and cardiovascular risk factors: Obesity: no Body mass index is 29.04 kg/m.  Hypertension: Yes  Hyperlipidemia : Yes, on statin  Lipids: He had been on Crestor  and simvastatin  and these were stopped because of increased liver functions Because of significantly high lipids he has been on 40 mg pravastatin   LDL has been well controlled.  Current / Home Diabetic regimen includes:  Metformin  1000 mg 2 times a day. Amaryl  2 mg daily. Actos  30 mg daily.  Prior diabetic medications: Trulicity  up to 1.5 mg weekly in the past, stopped due to not covered by insurance at that time, due to donut hole.  He used to be on Victoza  switched to Trulicity .  Nausea with Victoza  1.2 mg in the past.  Glycemic data:   He reports his glucometer is not working and has not been checking blood sugar at home.  Hypoglycemia: Patient has no hypoglycemic episodes.  Patient has hypoglycemia awareness.  Factors modifying glucose control: 1.  Diabetic diet assessment: 3 meals a day.  2.  Staying active or exercising:   3.  Medication compliance: compliant most of the time.  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 He has been taken off his medication, previously because of his noncompliance with this prolactin consistently normal even without cabergoline .  He had taken cabergoline  onto the beginning of 2022.  Testosterone  level has been normal with normalization of prolactin  Interval history  Hemoglobin A1c 8.8%, worsening of diabetes control.  He reports he did not get Trulicity  from mail-order pharmacy that was planned and ordered in last visit.  Diabetes regimen as reviewed above.  He reports compliance with his medication.  He has been taking glimepiride  2 mg daily.  Laboratory results reviewed, stable renal function.  Mildly elevated urine microalbumin creatinine ratio.  Lipid level acceptable.  No other complaints today.  Prolactin level normal.  REVIEW OF SYSTEMS As per history of present illness.   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"   History of colonic polyps    Hyperlipemia    Hypertension    Knee pain    Memory loss    Paresthesia    Pneumonia 1990s X 1; ~ 2000 X 1   Prolactinoma (HCC)    Type II diabetes mellitus (HCC)    Vitreous floaters of right eye     PAST SURGICAL HISTORY: Past Surgical History:  Procedure Laterality Date   CARDIAC CATHETERIZATION  ?2006; 01/25/2018   ELBOW SURGERY Left    "something to do w/the muscle"   JOINT REPLACEMENT     LEFT HEART CATH  AND CORONARY ANGIOGRAPHY N/A 01/26/2018   Procedure: LEFT HEART CATH AND CORONARY ANGIOGRAPHY;  [place]: [place], Peter M, MD;  Location: [facility];  Service: Cardiovascular;  Laterality: N/A;   PICC LINE INSERTION  ~ 2005   "took it out months later"   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"   TOTAL KNEE ARTHROPLASTY Left 2005   TRIGGER FINGER RELEASE Bilateral ~ 2016    ALLERGIES: Allergies  Allergen Reactions   Atorvastatin Itching    Other reaction(s): Other, Unknown Other reaction(s): increased LFT's   Dextran Rash    Other Reaction: Not Assessed Other reaction(s): Other Other Reaction: Not Assessed Other Reaction: Not Assessed Other Reaction: Not Assessed    FAMILY HISTORY:  Family History  Problem Relation Age of Onset   Cancer Father    Diabetes Brother    Heart attack Brother 47   Allergic rhinitis Neg Hx    Angioedema Neg Hx    Asthma Neg Hx    Atopy Neg Hx    Eczema Neg Hx    Immunodeficiency Neg Hx    Urticaria Neg Hx     SOCIAL HISTORY: Social History   Socioeconomic History   Marital status: Married    Spouse name: Not on file   Number of children: Not on file   Years of education: Not on file   Highest education level: Not on file  Occupational History   Not on file  Tobacco Use   Smoking status: Never   Smokeless tobacco: Never  Vaping Use   Vaping status: Never Used  Substance and Sexual Activity   Alcohol use: Never   Drug use: Never   Sexual activity: Yes  Other Topics Concern   Not on file  Social History Narrative   Not on file   Social Drivers of Health   Financial Resource Strain: Low Risk  (02/15/2022)   Received from [facility]   Overall Financial Resource Strain (CARDIA)    Difficulty of Paying Living Expenses: Not very hard  Food Insecurity: No Food Insecurity (02/15/2022)   Received from [facility], [facility]   Hunger Vital Sign    Worried About Running Out of Food in  the Last Year: Never true    Ran Out of Food in the Last Year: Never true  Transportation Needs: Not on file  Physical Activity: Sufficiently Active (02/15/2022)   Received from [facility], [facility]   Exercise Vital Sign    Days of Exercise per Week: 4 days    Minutes of Exercise per Session: 60 min  Stress: No Stress Concern Present (02/15/2022)   Received from [employer], [facility]   [employer]  [facility] - Occupational Stress Questionnaire    Feeling of Stress : Not at all  Social Connections: Socially Integrated (02/15/2022)   Received from [facility], [facility]   Social Network    How would you rate your social network (family, work, friends)?: Good participation with social networks    MEDICATIONS:  Current Outpatient Medications  Medication Sig Dispense Refill   acetaminophen  (TYLENOL ) 500 MG tablet Take 500 mg by mouth as needed. pain     albuterol  (VENTOLIN  HFA) 108 (90 Base) MCG/ACT inhaler Inhale 1-2 puffs into the lungs every 6 (six) hours as needed for wheezing or shortness of breath. 1 each 0   amLODipine  (NORVASC ) 10 MG tablet TAKE 1 TABLET EVERY DAY 90 tablet 3   aspirin  81 MG EC tablet Take 81 mg by mouth daily.     augmented betamethasone dipropionate (DIPROLENE-AF) 0.05 % cream APPLY SPARINGLY TO AFFECTED AREA TWICE A DAY     Blood Glucose Monitoring Suppl (ACCU-CHEK GUIDE) w/Device KIT 1 Device by Does not apply route daily. 1 kit 0   Blood Glucose Monitoring Suppl DEVI 1 each by Does not apply route in the morning, at noon, and at bedtime. May substitute to any manufacturer covered by patient's insurance. 1 each 0   carvedilol  (COREG ) 12.5 MG tablet Take 1 tablet (12.5 mg total) by mouth 2 (two) times daily. 180 tablet 2   chlorthalidone  (HYGROTON ) 25 MG tablet TAKE 1 TABLET EVERY DAY 90 tablet 3   Cholecalciferol (VITAMIN D3) 10 MCG (400 UNIT) CAPS SMARTSIG:1 Capsule(s) By Mouth     ciclopirox (PENLAC) 8 % solution SMARTSIG:1  sparingly Topical Every Night     empagliflozin  (JARDIANCE ) 25 MG TABS tablet Take 1 tablet (25 mg total) by mouth daily before breakfast. 90 tablet 3   GEMTESA 75 MG TABS Take 1 tablet by mouth daily.     glucose blood (ACCU-CHEK GUIDE) test strip Use as instructed 100 each 12   Glucose Blood (BLOOD GLUCOSE TEST STRIPS) STRP 1 each by In Vitro route daily. May substitute to any manufacturer covered by patient's insurance. 100 each 3   irbesartan  (AVAPRO ) 300 MG tablet TAKE 1 TABLET EVERY DAY 90 tablet 3   Lancet Device MISC 1 each by Does not apply route in the morning, at noon, and at bedtime. May substitute to any manufacturer covered by patient's insurance. 1 each 0   Lancets Misc. MISC 1 each by Does not apply route daily. May substitute to any manufacturer covered by patient's insurance. 100 each 3   latanoprost  (XALATAN ) 0.005 % ophthalmic solution SMARTSIG:In Eye(s)     metFORMIN  (GLUCOPHAGE ) 1000 MG tablet Take 1 tablet (1,000 mg total) by mouth 2 (two) times daily with a meal. 180 tablet 3   potassium chloride  (KLOR-CON  M) 10 MEQ tablet TAKE 1 TABLET EVERY DAY 90 tablet 0   pravastatin  (PRAVACHOL ) 40 MG tablet Take 1 tablet (40 mg total) by mouth daily. 90 tablet 3   tadalafil  (CIALIS ) 5 MG tablet TAKE 1 TABLET EVERY DAY AS NEEDED FOR ERECTILE DYSFUNCTION 30 tablet 0   tamsulosin (FLOMAX) 0.4 MG CAPS capsule Take 0.4 mg by mouth daily.     terbinafine (LAMISIL) 250 MG tablet SMARTSIG:1 Tablet(s) By Mouth     Dulaglutide  (TRULICITY ) 0.75 MG/0.5ML SOAJ Inject 0.75 mg in am weekly under skin 2 mL 0   Dulaglutide  (TRULICITY ) 1.5 MG/0.5ML SOAJ Inject 1.5 mg into the skin once a week. After completion of 4 weeks of 0.75 mcg.  6 mL 3   glimepiride  (AMARYL ) 2 MG tablet TAKE 1/2 TABLET EVERY DAY AT DINNER 90 tablet 3   pioglitazone  (ACTOS ) 30 MG tablet Take 1 tablet (30 mg total) by mouth daily. 90 tablet 3   No current facility-administered medications for this visit.    PHYSICAL  EXAM: Vitals:   08/10/23 0912 08/10/23 0913  BP: (!) 162/80 (!) 150/80  Resp: 20   SpO2: 98%   Weight: 202 lb 6.4 oz (91.8 kg)   Height: 5\' 10"  (1.778 m)     Body mass index is 29.04 kg/m.  Wt Readings from Last 3 Encounters:  08/10/23 202 lb 6.4 oz (91.8 kg)  07/10/23 200 lb 12.8 oz (91.1 kg)  05/10/23 200 lb 12.8 oz (91.1 kg)    General: Well developed, well nourished male in no apparent distress.  HEENT: AT/[place], no external lesions.  Eyes: Conjunctiva clear and no icterus. Neck: Neck supple  Lungs: Respirations not labored Neurologic: Alert, oriented, normal speech Extremities / Skin: Dry.  Psychiatric: Does not appear depressed or anxious  Diabetic Foot Exam - Simple   No data filed    LABS Reviewed Lab Results  Component Value Date   HGBA1C 8.8 (H) 08/07/2023   HGBA1C 7.8 (A) 05/10/2023   HGBA1C 7.1 (H) 09/01/2022   Lab Results  Component Value Date   FRUCTOSAMINE 259 11/28/2017   FRUCTOSAMINE 261 07/13/2016   Lab Results  Component Value Date   CHOL 186 08/07/2023   HDL 58 08/07/2023   LDLCALC 96 08/07/2023   LDLDIRECT 60.0 09/01/2022   TRIG 228 (H) 08/07/2023   CHOLHDL 3.2 08/07/2023   Lab Results  Component Value Date   MICRALBCREAT 33 (H) 08/07/2023   MICRALBCREAT 1.5 09/01/2022   Lab Results  Component Value Date   CREATININE 1.21 08/07/2023   Lab Results  Component Value Date   GFR 57.66 (L) 09/01/2022    ASSESSMENT / PLAN  1. Uncontrolled type 2 diabetes mellitus with hyperglycemia, without long-term current use of insulin  (HCC)   2. Prolactinoma (HCC)     Diabetes Mellitus type 2, complicated by mild neuropathy, CAD, ?  CKD. - Diabetic status / severity: Uncontrolled.  Lab Results  Component Value Date   HGBA1C 8.8 (H) 08/07/2023    - Hemoglobin A1c goal : <7%  Patient has not taken Trulicity  for more than a year, he likes to restart, did not get it from the pharmacy as planned in the last visit.  He wants to take Trulicity   and does not want to try other GLP-1 receptor agonist.  Adjusted diabetes regimen as follows.  - Medications:   Continue pioglitazone /Actos  30 mg daily. Continue Amaryl /glimepiride  2 mg daily.  Take with meal. Continue metformin  1000 mg 2 times a day.  Better to take with meal. Start Trulicity  0.75 mg weekly for 4 weeks and increase to 1.5 mg weekly. Start Jardiance  25 mg daily.  Check for lab BMP in 3 weeks after being on Jardiance .  - Home glucose testing: Check in the morning fasting and at bedtime.  Patient has glucometer and test supplies. - Discussed/ Gave Hypoglycemia treatment plan.  # Consult : not required at this time.   # Annual urine for microalbuminuria/ creatinine ratio, + microalbuminuria currently, continue ACE/ARB /irbesartan . Last  Lab Results  Component Value Date   MICRALBCREAT 33 (H) 08/07/2023    # Foot check nightly / neuropathy.  # Annual dilated diabetic eye exams.   - Diet: Make healthy diabetic food choices -  Life style / activity / exercise: Discussed.  2. Blood pressure  -  BP Readings from Last 1 Encounters:  08/10/23 (!) 150/80    - Control is not in target. Mildly elevated, asked to monitor at home, if still high advised to discuss with primary care provider. - No change in current plans.  3. Lipid status / Hyperlipidemia - Last  Lab Results  Component Value Date   LDLCALC 96 08/07/2023   - Continue pravastatin  40 mg daily.  # Prolactinoma/hyperprolactinemia -He was diagnosed with prolactinoma in 2007.  He was on cabergoline  until beginning of 2022.  He was not fully compliant in the past.  His prolactin has remained normal without cabergoline . - Will check prolactin annually.  Recent prolactin level normal.  Diagnoses and all orders for this visit:  Uncontrolled type 2 diabetes mellitus with hyperglycemia, without long-term current use of insulin  (HCC) -     Dulaglutide  (TRULICITY ) 0.75 MG/0.5ML SOAJ; Inject 0.75 mg in am  weekly under skin -     Dulaglutide  (TRULICITY ) 1.5 MG/0.5ML SOAJ; Inject 1.5 mg into the skin once a week. After completion of 4 weeks of 0.75 mcg. -     glimepiride  (AMARYL ) 2 MG tablet; TAKE 1/2 TABLET EVERY DAY AT DINNER -     pioglitazone  (ACTOS ) 30 MG tablet; Take 1 tablet (30 mg total) by mouth daily. -     Blood Glucose Monitoring Suppl DEVI; 1 each by Does not apply route in the morning, at noon, and at bedtime. May substitute to any manufacturer covered by patient's insurance. -     Glucose Blood (BLOOD GLUCOSE TEST STRIPS) STRP; 1 each by In Vitro route daily. May substitute to any manufacturer covered by patient's insurance. -     Lancet Device MISC; 1 each by Does not apply route in the morning, at noon, and at bedtime. May substitute to any manufacturer covered by patient's insurance. -     Lancets Misc. MISC; 1 each by Does not apply route daily. May substitute to any manufacturer covered by patient's insurance. -     empagliflozin  (JARDIANCE ) 25 MG TABS tablet; Take 1 tablet (25 mg total) by mouth daily before breakfast. -     Basic Metabolic Panel Without GFR  Prolactinoma (HCC)     DISPOSITION Follow up in clinic in 3 months suggested.     All questions answered and patient verbalized understanding of the plan.  [place] Alfreida Steffenhagen, MD [facility] Endocrinology [facility] [address] [place], [address] [place], [place] [address] Phone # [phone]  At least part of this note was generated using voice recognition software. Inadvertent word errors may have occurred, which were not recognized during the proofreading process.

## 2023-08-10 NOTE — Telephone Encounter (Signed)
 Monitor was returned on 4/16

## 2023-08-16 ENCOUNTER — Other Ambulatory Visit: Payer: Self-pay

## 2023-08-18 DIAGNOSIS — H4720 Unspecified optic atrophy: Secondary | ICD-10-CM | POA: Diagnosis not present

## 2023-08-18 DIAGNOSIS — H532 Diplopia: Secondary | ICD-10-CM | POA: Diagnosis not present

## 2023-08-27 ENCOUNTER — Encounter (HOSPITAL_BASED_OUTPATIENT_CLINIC_OR_DEPARTMENT_OTHER): Payer: Self-pay

## 2023-08-27 ENCOUNTER — Other Ambulatory Visit: Payer: Self-pay

## 2023-08-27 ENCOUNTER — Emergency Department (HOSPITAL_BASED_OUTPATIENT_CLINIC_OR_DEPARTMENT_OTHER): Admitting: Radiology

## 2023-08-27 ENCOUNTER — Emergency Department (HOSPITAL_BASED_OUTPATIENT_CLINIC_OR_DEPARTMENT_OTHER)
Admission: EM | Admit: 2023-08-27 | Discharge: 2023-08-27 | Disposition: A | Discharge status: Home / Self Care | Attending: Emergency Medicine | Admitting: Emergency Medicine

## 2023-08-27 DIAGNOSIS — Z7982 Long term (current) use of aspirin: Secondary | ICD-10-CM | POA: Insufficient documentation

## 2023-08-27 DIAGNOSIS — M25512 Pain in left shoulder: Secondary | ICD-10-CM | POA: Insufficient documentation

## 2023-08-27 DIAGNOSIS — M19012 Primary osteoarthritis, left shoulder: Secondary | ICD-10-CM | POA: Diagnosis not present

## 2023-08-27 DIAGNOSIS — W11XXXA Fall on and from ladder, initial encounter: Secondary | ICD-10-CM | POA: Diagnosis not present

## 2023-08-27 DIAGNOSIS — Z7984 Long term (current) use of oral hypoglycemic drugs: Secondary | ICD-10-CM | POA: Diagnosis not present

## 2023-08-27 DIAGNOSIS — I2510 Atherosclerotic heart disease of native coronary artery without angina pectoris: Secondary | ICD-10-CM | POA: Insufficient documentation

## 2023-08-27 DIAGNOSIS — Z79899 Other long term (current) drug therapy: Secondary | ICD-10-CM | POA: Diagnosis not present

## 2023-08-27 DIAGNOSIS — E119 Type 2 diabetes mellitus without complications: Secondary | ICD-10-CM | POA: Diagnosis not present

## 2023-08-27 DIAGNOSIS — I10 Essential (primary) hypertension: Secondary | ICD-10-CM | POA: Insufficient documentation

## 2023-08-27 MED ORDER — OXYCODONE HCL 5 MG PO TABS: 2.5000 mg | ORAL_TABLET | Freq: Four times a day (QID) | ORAL | 0 refills | Status: AC | PRN

## 2023-08-27 MED ORDER — HYDROCODONE-ACETAMINOPHEN 5-325 MG PO TABS
1.0000 | ORAL_TABLET | Freq: Once | ORAL | Status: AC
  Administered 2023-08-27: 1 via ORAL
  Filled 2023-08-27: qty 1

## 2023-08-27 MED ORDER — METHYLPREDNISOLONE 4 MG PO TBPK: ORAL_TABLET | ORAL | 0 refills | Status: AC

## 2023-08-27 NOTE — ED Notes (Signed)
 RN reviewed discharge instructions with pt. Pt verbalized understanding and had no further questions. VSS upon discharge.

## 2023-08-27 NOTE — ED Provider Notes (Signed)
 [facility]MENT AT [facility] Provider Note   CSN: [id number] Arrival date & time: 08/27/23  1336     History  Chief Complaint  Patient presents with   Shoulder Pain    left    Joshua Vargas is a 78 y.o. male.   Shoulder Pain   78 year old male presents emergency department with complaints of left shoulder pain.  States that symptoms initially began 3 weeks ago.  States that he went to open the door and developed left shoulder pain when he was pressing on front of him.  The door then hit his left shoulder.  States he did have a fall about 11 days ago.  Was walking on a ladder that had 2 steps and lost his balance falling backwards mainly landing on his right shoulder but then subsequently hit his left shoulder on the ladder.  Has been having worsening pain since then.  Has been trying over-the-counter medications without significant improvement.  States that he is having difficulty with overhead movements secondary to pain and feeling weak in his shoulder.  Presents emergency department for further assessment.  Past medical history significant for BPH, hyperlipidemia, hypertension, diabetes mellitus type 2, CAD, OSA  Home Medications Prior to Admission medications   Medication Sig Start Date End Date Taking? Authorizing Provider  methylPREDNISolone  (MEDROL  DOSEPAK) 4 MG TBPK tablet See pack instructions 08/27/23  Yes Neil Balls A, PA  oxyCODONE  (ROXICODONE ) 5 MG immediate release tablet Take 0.5 tablets (2.5 mg total) by mouth every 6 (six) hours as needed for severe pain (pain score 7-10) or breakthrough pain. 08/27/23  Yes Neil Balls A, PA  acetaminophen  (TYLENOL ) 500 MG tablet Take 500 mg by mouth as needed. pain    [provider]  albuterol  (VENTOLIN  HFA) 108 (90 Base) MCG/ACT inhaler Inhale 1-2 puffs into the lungs every 6 (six) hours as needed for wheezing or shortness of breath. 04/05/21   Conklin, Erica R, PA-C  amLODipine   (NORVASC ) 10 MG tablet TAKE 1 TABLET EVERY DAY 12/23/22   Jacqueline Matsu, MD  aspirin  81 MG EC tablet Take 81 mg by mouth daily.    [provider]  augmented betamethasone dipropionate (DIPROLENE-AF) 0.05 % cream APPLY SPARINGLY TO AFFECTED AREA TWICE A DAY 06/02/21   [provider]  Blood Glucose Monitoring Suppl (ACCU-CHEK GUIDE) w/Device KIT 1 Device by Does not apply route daily. 12/18/19   Lajean Pike, MD  Blood Glucose Monitoring Suppl DEVI 1 each by Does not apply route in the morning, at noon, and at bedtime. May substitute to any manufacturer covered by patient's insurance. 08/10/23   Thapa, [place], MD  carvedilol  (COREG ) 12.5 MG tablet Take 1 tablet (12.5 mg total) by mouth 2 (two) times daily. 01/02/23   Jacqueline Matsu, MD  chlorthalidone  (HYGROTON ) 25 MG tablet TAKE 1 TABLET EVERY DAY 12/23/22   Jacqueline Matsu, MD  Cholecalciferol (VITAMIN D3) 10 MCG (400 UNIT) CAPS SMARTSIG:1 Capsule(s) By Mouth 06/02/21   [provider]  ciclopirox (PENLAC) 8 % solution SMARTSIG:1 sparingly Topical Every Night 07/01/21   [provider]  Dulaglutide  (TRULICITY ) 0.75 MG/0.5ML SOAJ Inject 0.75 mg in am weekly under skin 08/10/23   Thapa, [place], MD  Dulaglutide  (TRULICITY ) 1.5 MG/0.5ML SOAJ Inject 1.5 mg into the skin once a week. After completion of 4 weeks of 0.75 mcg. 08/10/23   Thapa, [place], MD  empagliflozin  (JARDIANCE ) 25 MG TABS tablet Take 1 tablet (25 mg total) by mouth daily before breakfast.  08/10/23   Thapa, [place], MD  GEMTESA 75 MG TABS Take 1 tablet by mouth daily. 01/27/22   [provider]  glimepiride  (AMARYL ) 2 MG tablet TAKE 1/2 TABLET EVERY DAY AT DINNER 08/10/23   Thapa, [place], MD  glucose blood (ACCU-CHEK GUIDE) test strip Use as instructed 12/18/19   Lajean Pike, MD  Glucose Blood (BLOOD GLUCOSE TEST STRIPS) STRP 1 each by In Vitro route daily. May substitute to any manufacturer covered by patient's insurance. 08/10/23 08/09/24  Thapa, [place], MD  irbesartan   (AVAPRO ) 300 MG tablet TAKE 1 TABLET EVERY DAY 12/23/22   Jacqueline Matsu, MD  Lancet Device MISC 1 each by Does not apply route in the morning, at noon, and at bedtime. May substitute to any manufacturer covered by patient's insurance. 08/10/23 09/09/23  Thapa, [place], MD  Lancets Misc. MISC 1 each by Does not apply route daily. May substitute to any manufacturer covered by patient's insurance. 08/10/23 08/09/24  Thapa, [place], MD  latanoprost  (XALATAN ) 0.005 % ophthalmic solution SMARTSIG:In Eye(s) 09/13/21   [provider]  metFORMIN  (GLUCOPHAGE ) 1000 MG tablet Take 1 tablet (1,000 mg total) by mouth 2 (two) times daily with a meal. 05/10/23   Thapa, [place], MD  pioglitazone  (ACTOS ) 30 MG tablet Take 1 tablet (30 mg total) by mouth daily. 08/10/23   Thapa, [place], MD  potassium chloride  (KLOR-CON  M) 10 MEQ tablet TAKE 1 TABLET EVERY DAY 11/05/21   Lajean Pike, MD  pravastatin  (PRAVACHOL ) 40 MG tablet Take 1 tablet (40 mg total) by mouth daily. 05/10/23   Thapa, [place], MD  tadalafil  (CIALIS ) 5 MG tablet TAKE 1 TABLET EVERY DAY AS NEEDED FOR ERECTILE DYSFUNCTION 09/22/20   Lajean Pike, MD  tamsulosin (FLOMAX) 0.4 MG CAPS capsule Take 0.4 mg by mouth daily. 01/27/22   [provider]  terbinafine (LAMISIL) 250 MG tablet SMARTSIG:1 Tablet(s) By Mouth 06/02/21   [provider]      Allergies    Atorvastatin and Dextran    Review of Systems   Review of Systems  All other systems reviewed and are negative.   Physical Exam Updated Vital Signs BP 127/60 (BP Location: Right Arm)   Pulse 64   Temp 97.8 F (36.6 C)   Resp 18   Ht 5\' 10"  (1.778 m)   Wt 90.7 kg   SpO2 98%   BMI 28.70 kg/m  Physical Exam Vitals and nursing note reviewed.  Constitutional:      General: He is not in acute distress.    Appearance: He is well-developed.  HENT:     Head: Normocephalic and atraumatic.  Eyes:     Conjunctiva/sclera: Conjunctivae normal.  Cardiovascular:     Rate and Rhythm: Normal  rate and regular rhythm.     Heart sounds: No murmur heard. Pulmonary:     Effort: Pulmonary effort is normal. No respiratory distress.     Breath sounds: Normal breath sounds.  Abdominal:     Palpations: Abdomen is soft.     Tenderness: There is no abdominal tenderness.  Musculoskeletal:        General: No swelling.     Cervical back: Neck supple.     Comments: Patient with tenderness palpation left proximal humerus.  Radial pulses 2/bilaterally.  Muscular strength 5-5 grip elbow flexion/extension bilaterally.  Patient unable to flex, extend or horizontal AB duct left shoulder more than 30 to 40 degrees.  Pain with passive range of motion of left shoulder around 45 degrees in said directions.  No overlying erythema compartment flexor/induration.  Unable to perform liftoff, empty can, Jobes, Neer's due to discomfort and inability.  No midline tenderness cervical, thoracic and lumbar spine without step-off or deformity.  No paraspinal tenderness in cervical region.  No bony tenderness otherwise bilateral upper or lower extremities.  Skin:    General: Skin is warm and dry.     Capillary Refill: Capillary refill takes less than 2 seconds.  Neurological:     Mental Status: He is alert.  Psychiatric:        Mood and Affect: Mood normal.     ED Results / Procedures / Treatments   Labs (all labs ordered are listed, but only abnormal results are displayed) Labs Reviewed - No data to display  EKG None  Radiology DG Shoulder Left Result Date: 08/27/2023 CLINICAL DATA:  Left shoulder pain. EXAM: LEFT SHOULDER - 2+ VIEW COMPARISON:  None Available. FINDINGS: There is no evidence of fracture or dislocation. Acromioclavicular degenerative change. Minor inferior glenoid spurring. Faint soft tissue calcification in the region of the rotator cuff insertion. IMPRESSION: 1. Mild acromioclavicular and glenohumeral degenerative change. 2. Faint soft tissue calcification in the region of the rotator cuff  insertion may represent calcific tendinitis. Electronically Signed   By: Chadwick Colonel M.D.   On: 08/27/2023 14:53    Procedures Procedures    Medications Ordered in ED Medications  HYDROcodone -acetaminophen  (NORCO/VICODIN) 5-325 MG per tablet 1 tablet (has no administration in time range)    ED Course/ Medical Decision Making/ A&P                                 Medical Decision Making Amount and/or Complexity of Data Reviewed Radiology: ordered.   This patient presents to the ED for concern of shoulder pain, this involves an extensive number of treatment options, and is a complaint that carries with it a high risk of complications and morbidity.  The differential diagnosis includes fracture, strain/pain, dislocation, ligament/tendon injury, neurovasc compromise, septic arthritis, DVT, ACS, arterial dissection, other   Co morbidities that complicate the patient evaluation  See HPI   Additional history obtained:  Additional history obtained from EMR External records from outside source obtained and reviewed including hospital records   Lab Tests:  N/a   Imaging Studies ordered:  I ordered imaging studies including left shoulder x-ray I independently visualized and interpreted imaging which showed no acute osseous abnormality.  Mild AC and glenohumeral degenerative changes.  Soft tissue calcification in area of rotator cuff concerning for possible calcific tendinitis I agree with the radiologist interpretation  Cardiac Monitoring: / EKG:  N/a   Consultations Obtained:  N/a   Problem List / ED Course / Critical interventions / Medication management  Left shoulder pain I ordered medication including norco  Reevaluation of the patient after these medicines showed that the patient improved I have reviewed the patients home medicines and have made adjustments as needed   Social Determinants of Health:  Denies tobacco, licit drug use.   Test /  Admission - Considered:  Left shoulder pain Vitals signs within normal range and stable throughout visit. Imaging studies significant for: See above On exam, tender to palpation of proximal humerus.  Patient with difficulty performing a special test of left shoulder due to pain and limited range of motion.  No overlying skin changes concerning for secondary infectious process.  No pulse deficits suggest ischemic limb.  No upper extremity  edema concerning for DVT.  X-ray obtained by triage staff which did show evidence of calcific tendinitis, degenerative changes but without obvious osseous abnormality acute.  Patient symptoms concerning for rotator cuff pathology.  Given patient's history of cardiac complications, will avoid NSAIDs.  Patient with history of diabetes but states that sugars been controlled well in the past when being on corticosteroids.  Will trial Medrol  Dosepak.  Will recommend rest, ice, continued range of motion as able.  Will recommend follow-up with orthopedics in the outpatient setting for reassessment.  Treatment plan discussed with patient and he acknowledged understanding was agreeable to said plan.  Patient overall well-appearing, afebrile in no acute distress. Worrisome signs and symptoms were discussed with the patient, and the patient acknowledged understanding to return to the ED if noticed. Patient was stable upon discharge.          Final Clinical Impression(s) / ED Diagnoses Final diagnoses:  Acute pain of left shoulder    Rx / DC Orders ED Discharge Orders     None         [place], [place] 08/27/23 1551    Sallyanne Creamer, DO 09/01/23 1034

## 2023-08-27 NOTE — Discharge Instructions (Signed)
 As discussed, x-ray did not show obvious fracture or dislocation.  Concern for rotator cuff injury.  Will place you in a shoulder sling today but continue to range her shoulder at home to avoid frozen shoulder.  You may continue to take Tylenol  for pain.  Will send you in with a Medrol  Dosepak to help with the pain and inflammation as well as pain medication for breakthrough pain.  Recommend close follow-up with orthopedics in the outpatient setting for reassessment.  Please not hesitate to return if the worrisome signs and symptoms we discussed become apparent.

## 2023-08-27 NOTE — ED Triage Notes (Signed)
 Patient arrives POV with complaints of worsening left shoulder pain x3 weeks. Patient reports repeat mechanical falls as well. Rates pain an 8/10.

## 2023-08-28 DIAGNOSIS — S4982XA Other specified injuries of left shoulder and upper arm, initial encounter: Secondary | ICD-10-CM | POA: Diagnosis not present

## 2023-08-30 ENCOUNTER — Ambulatory Visit (INDEPENDENT_AMBULATORY_CARE_PROVIDER_SITE_OTHER): Admitting: Orthopaedic Surgery

## 2023-08-30 ENCOUNTER — Encounter: Payer: Self-pay | Admitting: Physical Therapy

## 2023-08-30 ENCOUNTER — Ambulatory Visit (INDEPENDENT_AMBULATORY_CARE_PROVIDER_SITE_OTHER): Admitting: Physical Therapy

## 2023-08-30 DIAGNOSIS — M7582 Other shoulder lesions, left shoulder: Secondary | ICD-10-CM | POA: Diagnosis not present

## 2023-08-30 DIAGNOSIS — G8929 Other chronic pain: Secondary | ICD-10-CM

## 2023-08-30 DIAGNOSIS — M25512 Pain in left shoulder: Secondary | ICD-10-CM

## 2023-08-30 NOTE — Progress Notes (Signed)
 Chief Complaint: Left shoulder pain     History of Present Illness:    Joshua Vargas is a 78 y.o. male presents today with ongoing left shoulder pain after an injury and a direct fall on the side on May 18.  He was placed on a steroid pack with by the emergency provider which is given him significant pain relief.  His overhead range of motion has improved dramatically as the steroid has been kicking in.  Overall his overhead range of motion is much improving and his pain is continuing to improve as well.    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"   History of colonic polyps    Hyperlipemia    Hypertension    Knee pain    Memory loss    Paresthesia    Pneumonia 1990s X 1; ~ 2000 X 1   Prolactinoma (HCC)    Type II diabetes mellitus (HCC)    Vitreous floaters of right eye    Past Surgical History:  Procedure Laterality Date   CARDIAC CATHETERIZATION  ?2006; 01/25/2018   ELBOW SURGERY Left    "something to do w/the muscle"   JOINT REPLACEMENT     LEFT HEART CATH AND CORONARY ANGIOGRAPHY N/A 01/26/2018   Procedure: LEFT HEART CATH AND CORONARY ANGIOGRAPHY;  Mian: [place], Peter M, MD;  Location: [facility] INVASIVE CV LAB;  Service: Cardiovascular;  Laterality: N/A;   PICC LINE INSERTION  ~ 2005   "took it out months later"   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"   TOTAL KNEE ARTHROPLASTY Left 2005   TRIGGER FINGER RELEASE Bilateral ~ 2016   Social History   Socioeconomic History   Marital status: Married    Spouse name: Not on file   Number of children: Not on file   Years of education: Not on file   Highest education level: Not on file   Occupational History   Not on file  Tobacco Use   Smoking status: Never   Smokeless tobacco: Never  Vaping Use   Vaping status: Never Used  Substance and Sexual Activity   Alcohol use: Never   Drug use: Never   Sexual activity: Yes  Other Topics Concern   Not on file  Social History Narrative   Not on file   Social Drivers of Health   Financial Resource Strain: Low Risk  (02/15/2022)   Received from [facility], [facility]   Overall Financial Resource Strain (CARDIA)    Difficulty of Paying Living Expenses: Not very hard  Food Insecurity: No Food Insecurity (02/15/2022)   Received from [facility], [facility]   Hunger Vital Sign    Worried About Running Out of Food in the Last Year: Never true    Ran Out of Food in the Last Year: Never true  Transportation Needs: Not on file  Physical Activity: Sufficiently Active (02/15/2022)   Received from [facility], [facility]   Exercise Vital Sign    Days of Exercise per Week: 4 days    Minutes of Exercise per Session: 60 min  Stress: No Stress Concern Present (02/15/2022)   Received from  [facility], [facility]   [employer] of Occupational Health - Occupational Stress Questionnaire    Feeling of Stress : Not at all  Social Connections: Socially Integrated (02/15/2022)   Received from [facility], [facility]   Social Network    How would you rate your social network (family, work, friends)?: Good participation with social networks   Family History  Problem Relation Age of Onset   Cancer Father    Diabetes Brother    Heart attack Brother 85   Allergic rhinitis Neg Hx    Angioedema Neg Hx    Asthma Neg Hx    Atopy Neg Hx    Eczema Neg Hx    Immunodeficiency Neg Hx    Urticaria Neg Hx    Allergies  Allergen Reactions   Atorvastatin Itching    Other reaction(s): Other, Unknown Other reaction(s): increased LFT's   Dextran Rash    Other Reaction: Not Assessed Other reaction(s): Other Other  Reaction: Not Assessed Other Reaction: Not Assessed Other Reaction: Not Assessed   Current Outpatient Medications  Medication Sig Dispense Refill   acetaminophen  (TYLENOL ) 500 MG tablet Take 500 mg by mouth as needed. pain     albuterol  (VENTOLIN  HFA) 108 (90 Base) MCG/ACT inhaler Inhale 1-2 puffs into the lungs every 6 (six) hours as needed for wheezing or shortness of breath. 1 each 0   amLODipine  (NORVASC ) 10 MG tablet TAKE 1 TABLET EVERY DAY 90 tablet 3   aspirin  81 MG EC tablet Take 81 mg by mouth daily.     augmented betamethasone dipropionate (DIPROLENE-AF) 0.05 % cream APPLY SPARINGLY TO AFFECTED AREA TWICE A DAY     Blood Glucose Monitoring Suppl (ACCU-CHEK GUIDE) w/Device KIT 1 Device by Does not apply route daily. 1 kit 0   Blood Glucose Monitoring Suppl DEVI 1 each by Does not apply route in the morning, at noon, and at bedtime. May substitute to any manufacturer covered by patient's insurance. 1 each 0   carvedilol  (COREG ) 12.5 MG tablet Take 1 tablet (12.5 mg total) by mouth 2 (two) times daily. 180 tablet 2   chlorthalidone  (HYGROTON ) 25 MG tablet TAKE 1 TABLET EVERY DAY 90 tablet 3   Cholecalciferol (VITAMIN D3) 10 MCG (400 UNIT) CAPS SMARTSIG:1 Capsule(s) By Mouth     ciclopirox (PENLAC) 8 % solution SMARTSIG:1 sparingly Topical Every Night     Dulaglutide  (TRULICITY ) 0.75 MG/0.5ML SOAJ Inject 0.75 mg in am weekly under skin 2 mL 0   Dulaglutide  (TRULICITY ) 1.5 MG/0.5ML SOAJ Inject 1.5 mg into the skin once a week. After completion of 4 weeks of 0.75 mcg. 6 mL 3   empagliflozin  (JARDIANCE ) 25 MG TABS tablet Take 1 tablet (25 mg total) by mouth daily before breakfast. 90 tablet 3   GEMTESA 75 MG TABS Take 1 tablet by mouth daily.     glimepiride  (AMARYL ) 2 MG tablet TAKE 1/2 TABLET EVERY DAY AT DINNER 90 tablet 3   glucose blood (ACCU-CHEK GUIDE) test strip Use as instructed 100 each 12   Glucose Blood (BLOOD GLUCOSE TEST STRIPS) STRP 1 each by In Vitro route daily. May  substitute to any manufacturer covered by patient's insurance. 100 each 3   irbesartan  (AVAPRO ) 300 MG tablet TAKE 1 TABLET EVERY DAY 90 tablet 3   Lancet Device MISC 1 each by Does not apply route in the morning, at noon, and at bedtime. May substitute to any manufacturer covered by patient's insurance. 1 each 0   Lancets Misc. MISC  1 each by Does not apply route daily. May substitute to any manufacturer covered by patient's insurance. 100 each 3   latanoprost  (XALATAN ) 0.005 % ophthalmic solution SMARTSIG:In Eye(s)     metFORMIN  (GLUCOPHAGE ) 1000 MG tablet Take 1 tablet (1,000 mg total) by mouth 2 (two) times daily with a meal. 180 tablet 3   methylPREDNISolone  (MEDROL  DOSEPAK) 4 MG TBPK tablet See pack instructions 21 each 0   oxyCODONE  (ROXICODONE ) 5 MG immediate release tablet Take 0.5 tablets (2.5 mg total) by mouth every 6 (six) hours as needed for severe pain (pain score 7-10) or breakthrough pain. 12 tablet 0   pioglitazone  (ACTOS ) 30 MG tablet Take 1 tablet (30 mg total) by mouth daily. 90 tablet 3   potassium chloride  (KLOR-CON  M) 10 MEQ tablet TAKE 1 TABLET EVERY DAY 90 tablet 0   pravastatin  (PRAVACHOL ) 40 MG tablet Take 1 tablet (40 mg total) by mouth daily. 90 tablet 3   tadalafil  (CIALIS ) 5 MG tablet TAKE 1 TABLET EVERY DAY AS NEEDED FOR ERECTILE DYSFUNCTION 30 tablet 0   tamsulosin (FLOMAX) 0.4 MG CAPS capsule Take 0.4 mg by mouth daily.     terbinafine (LAMISIL) 250 MG tablet SMARTSIG:1 Tablet(s) By Mouth     No current facility-administered medications for this visit.   No results found.  Review of Systems:   A ROS was performed including pertinent positives and negatives as documented in the HPI.  Physical Exam :   Constitutional: NAD and appears stated age Neurological: Alert and oriented Psych: Appropriate affect and cooperative There were no vitals taken for this visit.   Comprehensive Musculoskeletal Exam:    Left shoulder with tenderness laterally about the  deltoid which radiates.  No biceps tenderness with negative speeds test.  Active forward elevation is to 160 degrees with negative Neer impingement.   Imaging:   Xray (3 views left shoulder): Calcific tendinitis with otherwise well-preserved joint    I personally reviewed and interpreted the radiographs.   Assessment and Plan:   78 y.o. male with left shoulder pain consistent with calcific tendinitis and a contusion as well.  At this time his range of motion is improving nicely with steroids.  At this time I did discuss I would like him to engage with physical therapy for strengthening range of motion I will plan to see him back in 6 weeks for reassessment.  I will also plan to see him back for his knee at that time  -Return to clinic 6 weeks for reassessment    I personally saw and evaluated the patient, and participated in the management and treatment plan.  Wilhelmenia Harada, MD Attending Physician, Orthopedic Surgery  This document was dictated using Dragon voice recognition software. A reasonable attempt at proof reading has been made to minimize errors.

## 2023-08-30 NOTE — Addendum Note (Signed)
 Addended by: Naeemah Jasmer L on: 08/30/2023 12:07 PM   Modules accepted: Level of Service

## 2023-08-30 NOTE — Therapy (Signed)
 OUTPATIENT PHYSICAL THERAPY SCREEN @[address]   Patient Name: Joshua Vargas MRN: [id number] DOB:[birth year], 78 y.o., male Today's Date: 08/30/2023  END OF SESSION:  PT End of Session - 08/30/23 0931     Visit Number 1    Activity Tolerance Patient tolerated treatment well    Behavior During Therapy [facility] for tasks assessed/performed             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"   History of colonic polyps    Hyperlipemia    Hypertension    Knee pain    Memory loss    Paresthesia    Pneumonia 1990s X 1; ~ 2000 X 1   Prolactinoma (HCC)    Type II diabetes mellitus (HCC)    Vitreous floaters of right eye    Past Surgical History:  Procedure Laterality Date   CARDIAC CATHETERIZATION  ?2006; 01/25/2018   ELBOW SURGERY Left    "something to do w/the muscle"   JOINT REPLACEMENT     LEFT HEART CATH AND CORONARY ANGIOGRAPHY N/A 01/26/2018   Procedure: LEFT HEART CATH AND CORONARY ANGIOGRAPHY;  [place]: [place], Peter M, MD;  Location: [facility] INVASIVE CV LAB;  Service: Cardiovascular;  Laterality: N/A;   PICC LINE INSERTION  ~ 2005   "took it out months later"   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"   TOTAL KNEE ARTHROPLASTY Left 2005   TRIGGER FINGER RELEASE Bilateral ~ 2016   Patient Active Problem List   Diagnosis Date Noted   Hearing loss 07/09/2021   Encounter for general adult medical examination with abnormal findings 07/09/2021   Sleep apnea 07/09/2021   Memory loss 08/24/2020   Bilateral carpal tunnel syndrome 02/17/2020   Neuropathy, 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, right 02/17/2020   Chronic lumbar radiculopathy 02/17/2020   Benign localized prostatic hyperplasia without lower urinary tract  symptoms (LUTS) 01/08/2020   Constipation 01/08/2020   Diabetes type 2, uncontrolled 01/08/2020   Enlarged prostate 01/08/2020   Family history of pancreatic cancer 01/08/2020   Knee pain 01/08/2020   Paresthesia 01/08/2020   History of colonic polyps 01/08/2020   Pure hypercholesterolemia 01/08/2020   Vitreous floaters of right eye 01/08/2020   Chest pain    Unstable angina (HCC) 01/25/2018   H/O total knee replacement, left 05/16/2017   Perennial and seasonal allergic rhinitis 07/31/2015   Nasal polyps 07/31/2015   Prolactinoma (HCC) 12/26/2012   Erectile dysfunction 12/26/2012   Diabetes mellitus (HCC) 12/17/2012   MIXED HYPERLIPIDEMIA 05/29/2008   ESSENTIAL HYPERTENSION, BENIGN 05/29/2008   CORONARY ATHEROSCLEROSIS NATIVE CORONARY ARTERY 05/29/2008     THERAPY DIAG:  Chronic left shoulder pain  Goal of screen:  This patient was referred to Physical Therapy specialty screen by Wilhelmenia Harada, MD for education for preparation of rehabilitation POC.   [facility] HEP code:  Access Code [facility]  [email or web address]  Clinical Impression & Plan:  Lt shoulder pain s/p fall. Steroid injection doing well. Provided with basic stretches/exercises to perform until he schedules evaluation. Planning to return to Dr B for knee surgery consult in about 6 weeks.    Keven Pel PT, DPT 08/30/2023, 9:33 AM  [address] [place], [place] [address] [phone]  Note: charges not applied for screen.

## 2023-08-31 ENCOUNTER — Other Ambulatory Visit

## 2023-08-31 DIAGNOSIS — E1165 Type 2 diabetes mellitus with hyperglycemia: Secondary | ICD-10-CM | POA: Diagnosis not present

## 2023-09-01 ENCOUNTER — Ambulatory Visit: Payer: Self-pay | Admitting: Endocrinology

## 2023-09-01 DIAGNOSIS — H4720 Unspecified optic atrophy: Secondary | ICD-10-CM | POA: Diagnosis not present

## 2023-09-01 DIAGNOSIS — H532 Diplopia: Secondary | ICD-10-CM | POA: Diagnosis not present

## 2023-09-01 LAB — BASIC METABOLIC PANEL WITHOUT GFR
BUN: 24 mg/dL (ref 7–25)
CO2: 32 mmol/L (ref 20–32)
Calcium: 9.6 mg/dL (ref 8.6–10.3)
Chloride: 96 mmol/L — ABNORMAL LOW (ref 98–110)
Creat: 1.06 mg/dL (ref 0.70–1.28)
Glucose, Bld: 112 mg/dL — ABNORMAL HIGH (ref 65–99)
Potassium: 3.6 mmol/L (ref 3.5–5.3)
Sodium: 137 mmol/L (ref 135–146)

## 2023-09-11 ENCOUNTER — Ambulatory Visit: Admitting: Podiatry

## 2023-09-11 ENCOUNTER — Encounter: Payer: Self-pay | Admitting: Podiatry

## 2023-09-11 VITALS — Ht 70.0 in | Wt 200.0 lb

## 2023-09-11 DIAGNOSIS — M79675 Pain in left toe(s): Secondary | ICD-10-CM

## 2023-09-11 DIAGNOSIS — M79674 Pain in right toe(s): Secondary | ICD-10-CM

## 2023-09-11 DIAGNOSIS — B351 Tinea unguium: Secondary | ICD-10-CM | POA: Diagnosis not present

## 2023-09-11 DIAGNOSIS — E1140 Type 2 diabetes mellitus with diabetic neuropathy, unspecified: Secondary | ICD-10-CM | POA: Diagnosis not present

## 2023-09-14 NOTE — Progress Notes (Signed)
  Subjective:  Patient ID: Joshua Vargas, male    DOB: 1945-10-08,  MRN: [id number]  78 y.o. male presents at risk foot care with history of diabetic neuropathy and painful thick toenails that are difficult to trim. Pain interferes with ambulation. Aggravating factors include wearing enclosed shoe gear. Pain is relieved with periodic professional debridement.  Chief Complaint  Patient presents with   Nail Problem    Pt is here for [facility] last A1C was 7 PCP is Dr Janifer Meigs and LOV was in October.    New problem(s): None   PCP is Rae Bugler, MD.  Allergies  Allergen Reactions   Atorvastatin Itching    Other reaction(s): Other, Unknown Other reaction(s): increased LFT's   Dextran Rash    Other Reaction: Not Assessed Other reaction(s): Other Other Reaction: Not Assessed Other Reaction: Not Assessed Other Reaction: Not Assessed    Review of Systems: Negative except as noted in the HPI.   Objective:  Joshua Vargas is a pleasant 78 y.o. male WD, WN in NAD. AAO x 3.  Vascular Examination: Vascular status intact b/l with palpable pedal pulses. CFT immediate b/l. Pedal hair present. No edema. No pain with calf compression b/l. Skin temperature gradient WNL b/l. No varicosities noted. No cyanosis or clubbing noted.  Neurological Examination: Sensation grossly intact b/l with 10 gram monofilament. Pt has subjective symptoms of neuropathy.  Dermatological Examination: Pedal skin with normal turgor, texture and tone b/l. No open wounds nor interdigital macerations noted. Toenails 1-5 b/l thick, discolored, elongated with subungual debris and pain on dorsal palpation. No hyperkeratotic lesions noted b/l.   Musculoskeletal Examination: Muscle strength 5/5 to b/l LE.  No pain, crepitus noted b/l. No gross pedal deformities.   Radiographs: None  Last A1c:      Latest Ref Rng & Units 08/07/2023    8:07 AM 05/10/2023    8:27 AM  Hemoglobin A1C  Hemoglobin-A1c <5.7 % 8.8  7.8       Assessment:   1. Pain due to onychomycosis of toenails of both feet   2. Type 2 diabetes mellitus with diabetic neuropathy, unspecified whether long term insulin  use (HCC)     Plan:  Consent given for treatment. Patient examined. All patient's and/or POA's questions/concerns addressed on today's visit. Toenails 1-5 debrided in length and girth without incident. Continue foot and shoe inspections daily. Monitor blood glucose per PCP/Endocrinologist's recommendations. Continue soft, supportive shoe gear daily. Report any pedal injuries to medical professional. Call office if there are any questions/concerns. -Patient/POA to call should there be question/concern in the interim.  Return in about 9 weeks (around 11/13/2023).  Luella Sager, DPM      [place] LOCATION: [address] [place] [address]                   Office [phone]   [facility] LOCATION: [address] [place], [place] [address] Office [phone]

## 2023-09-28 ENCOUNTER — Ambulatory Visit (HOSPITAL_BASED_OUTPATIENT_CLINIC_OR_DEPARTMENT_OTHER): Admitting: Orthopaedic Surgery

## 2023-09-28 ENCOUNTER — Ambulatory Visit (HOSPITAL_BASED_OUTPATIENT_CLINIC_OR_DEPARTMENT_OTHER)

## 2023-09-28 DIAGNOSIS — G8929 Other chronic pain: Secondary | ICD-10-CM

## 2023-09-28 DIAGNOSIS — Z96652 Presence of left artificial knee joint: Secondary | ICD-10-CM | POA: Diagnosis not present

## 2023-09-28 DIAGNOSIS — M7582 Other shoulder lesions, left shoulder: Secondary | ICD-10-CM | POA: Diagnosis not present

## 2023-09-28 DIAGNOSIS — M25562 Pain in left knee: Secondary | ICD-10-CM | POA: Diagnosis not present

## 2023-09-28 MED ORDER — TRIAMCINOLONE ACETONIDE 40 MG/ML IJ SUSP: 80.0000 mg | INTRAMUSCULAR | Status: AC | PRN

## 2023-09-28 MED ORDER — LIDOCAINE HCL 1 % IJ SOLN
4.0000 mL | INTRAMUSCULAR | Status: AC | PRN
  Administered 2023-09-28: 4 mL

## 2023-09-28 NOTE — Progress Notes (Signed)
 Chief Complaint: Left shoulder pain     History of Present Illness:   09/28/2023: Presents today for follow-up of his left shoulder.  He is still having some pain and would like an injection on this.  With regard to the left knee he states that he does occasionally get a pain in this but is overall doing quite well  Joshua Vargas is a 78 y.o. male presents today with ongoing left shoulder pain after an injury and a direct fall on the side on May 18.  He was placed on a steroid pack with by the emergency provider which is given him significant pain relief.  His overhead range of motion has improved dramatically as the steroid has been kicking in.  Overall his overhead range of motion is much improving and his pain is continuing to improve as well.    PMH/PSH/Family History/Social History/Meds/Allergies:    Past Medical History:  Diagnosis Date  . BEP (benign enlargement of prostate)   . BPH (benign prostatic hyperplasia)   . Constipation   . Coronary artery disease   . Elevated LDL cholesterol level   . Family history of pancreatic cancer   . History of blood transfusion 1969   w/bone transplant  . History of colonic polyps   . Hyperlipemia   . Hypertension   . Knee pain   . Memory loss   . Paresthesia   . Pneumonia 1990s X 1; ~ 2000 X 1  . Prolactinoma (HCC)   . Type II diabetes mellitus (HCC)   . Vitreous floaters of right eye    Past Surgical History:  Procedure Laterality Date  . CARDIAC CATHETERIZATION  ?2006; 01/25/2018  . ELBOW SURGERY Left    something to do w/the muscle  . JOINT REPLACEMENT    . LEFT HEART CATH AND CORONARY ANGIOGRAPHY N/A 01/26/2018   Procedure: LEFT HEART CATH AND CORONARY ANGIOGRAPHY;  [place]: [place], Peter M, MD;  Location: [facility] INVASIVE CV LAB;  Service: Cardiovascular;  Laterality: N/A;  . PICC LINE INSERTION  ~ 2005   took it out months later  . 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  . TOTAL KNEE  ARTHROPLASTY Left 2005  . TRIGGER FINGER RELEASE Bilateral ~ 2016   Social History   Socioeconomic History  . Marital status: Married    Spouse name: Not on file  . Number of children: Not on file  . Years of education: Not on file  . Highest education level: Not on file  Occupational History  . Not on file  Tobacco Use  . Smoking status: Never  . Smokeless tobacco: Never  Vaping Use  . Vaping status: Never Used  Substance and Sexual Activity  . Alcohol use: Never  . Drug use: Never  . Sexual activity: Yes  Other Topics Concern  . Not on file  Social History Narrative  . Not on file   Social Drivers of Health   Financial Resource Strain: Low Risk  (02/15/2022)   Received from [facility]   Overall Financial Resource Strain (CARDIA)   . Difficulty of Paying Living Expenses: Not very hard  Food Insecurity: No Food Insecurity (02/15/2022)   Received from [facility]   Hunger Vital Sign   . Within the past 12 months, you worried that your food would run out before you got the money to buy more.: Never true   . Within the past 12 months, the food you bought just didn't last  and you didn't have money to get more.: Never true  Transportation Needs: Not on file  Physical Activity: Sufficiently Active (02/15/2022)   Received from [facility]   Exercise Vital Sign   . On average, how many days per week do you engage in moderate to strenuous exercise (like a brisk walk)?: 4 days   . On average, how many minutes do you engage in exercise at this level?: 60 min  Stress: No Stress Concern Present (02/15/2022)   Received from [facility] - Occupational Stress Questionnaire   . Feeling of Stress : Not at all  Social Connections: Socially Integrated (02/15/2022)   Received from [facility]   Social Network   . How would you rate your social network (family, work, friends)?: Good participation with social networks   Family History   Problem Relation Age of Onset  . Cancer Father   . Diabetes Brother   . Heart attack Brother 51  . Allergic rhinitis Neg Hx   . Angioedema Neg Hx   . Asthma Neg Hx   . Atopy Neg Hx   . Eczema Neg Hx   . Immunodeficiency Neg Hx   . Urticaria Neg Hx    Allergies  Allergen Reactions  . Atorvastatin Itching    Other reaction(s): Other, Unknown Other reaction(s): increased LFT's  . Dextran Rash    Other Reaction: Not Assessed Other reaction(s): Other Other Reaction: Not Assessed Other Reaction: Not Assessed Other Reaction: Not Assessed   Current Outpatient Medications  Medication Sig Dispense Refill  . acetaminophen  (TYLENOL ) 500 MG tablet Take 500 mg by mouth as needed. pain    . albuterol  (VENTOLIN  HFA) 108 (90 Base) MCG/ACT inhaler Inhale 1-2 puffs into the lungs every 6 (six) hours as needed for wheezing or shortness of breath. 1 each 0  . amLODipine  (NORVASC ) 10 MG tablet TAKE 1 TABLET EVERY DAY 90 tablet 3  . aspirin  81 MG EC tablet Take 81 mg by mouth daily.    Joshua Vargas augmented betamethasone dipropionate (DIPROLENE-AF) 0.05 % cream APPLY SPARINGLY TO AFFECTED AREA TWICE A DAY    . Blood Glucose Monitoring Suppl (ACCU-CHEK GUIDE) w/Device KIT 1 Device by Does not apply route daily. 1 kit 0  . Blood Glucose Monitoring Suppl DEVI 1 each by Does not apply route in the morning, at noon, and at bedtime. May substitute to any manufacturer covered by patient's insurance. 1 each 0  . carvedilol  (COREG ) 12.5 MG tablet Take 1 tablet (12.5 mg total) by mouth 2 (two) times daily. 180 tablet 2  . chlorthalidone  (HYGROTON ) 25 MG tablet TAKE 1 TABLET EVERY DAY 90 tablet 3  . Cholecalciferol (VITAMIN D3) 10 MCG (400 UNIT) CAPS SMARTSIG:1 Capsule(s) By Mouth    . ciclopirox (PENLAC) 8 % solution SMARTSIG:1 sparingly Topical Every Night    . Dulaglutide  (TRULICITY ) 0.75 MG/0.5ML SOAJ Inject 0.75 mg in am weekly under skin 2 mL 0  . Dulaglutide  (TRULICITY ) 1.5 MG/0.5ML SOAJ Inject 1.5 mg into the  skin once a week. After completion of 4 weeks of 0.75 mcg. 6 mL 3  . empagliflozin  (JARDIANCE ) 25 MG TABS tablet Take 1 tablet (25 mg total) by mouth daily before breakfast. 90 tablet 3  . GEMTESA 75 MG TABS Take 1 tablet by mouth daily.    . glimepiride  (AMARYL ) 2 MG tablet TAKE 1/2 TABLET EVERY DAY AT DINNER 90 tablet 3  . glucose blood (ACCU-CHEK GUIDE) test strip Use as instructed 100  each 12  . Glucose Blood (BLOOD GLUCOSE TEST STRIPS) STRP 1 each by In Vitro route daily. May substitute to any manufacturer covered by patient's insurance. 100 each 3  . irbesartan  (AVAPRO ) 300 MG tablet TAKE 1 TABLET EVERY DAY 90 tablet 3  . Lancets Misc. MISC 1 each by Does not apply route daily. May substitute to any manufacturer covered by patient's insurance. 100 each 3  . latanoprost  (XALATAN ) 0.005 % ophthalmic solution SMARTSIG:In Eye(s)    . metFORMIN  (GLUCOPHAGE ) 1000 MG tablet Take 1 tablet (1,000 mg total) by mouth 2 (two) times daily with a meal. 180 tablet 3  . methylPREDNISolone  (MEDROL  DOSEPAK) 4 MG TBPK tablet See pack instructions 21 each 0  . oxyCODONE  (ROXICODONE ) 5 MG immediate release tablet Take 0.5 tablets (2.5 mg total) by mouth every 6 (six) hours as needed for severe pain (pain score 7-10) or breakthrough pain. 12 tablet 0  . pioglitazone  (ACTOS ) 30 MG tablet Take 1 tablet (30 mg total) by mouth daily. 90 tablet 3  . potassium chloride  (KLOR-CON  M) 10 MEQ tablet TAKE 1 TABLET EVERY DAY 90 tablet 0  . pravastatin  (PRAVACHOL ) 40 MG tablet Take 1 tablet (40 mg total) by mouth daily. 90 tablet 3  . tadalafil  (CIALIS ) 5 MG tablet TAKE 1 TABLET EVERY DAY AS NEEDED FOR ERECTILE DYSFUNCTION 30 tablet 0  . tamsulosin (FLOMAX) 0.4 MG CAPS capsule Take 0.4 mg by mouth daily.    Joshua Vargas terbinafine (LAMISIL) 250 MG tablet SMARTSIG:1 Tablet(s) By Mouth     No current facility-administered medications for this visit.   No results found.  Review of Systems:   A ROS was performed including pertinent  positives and negatives as documented in the HPI.  Physical Exam :   Constitutional: NAD and appears stated age Neurological: Alert and oriented Psych: Appropriate affect and cooperative There were no vitals taken for this visit.   Comprehensive Musculoskeletal Exam:    Left shoulder with tenderness laterally about the deltoid which radiates.  No biceps tenderness with negative speeds test.  Active forward elevation is to 160 degrees with negative Neer impingement.   Imaging:   Xray (3 views left shoulder 4 views left knee): Calcific tendinitis with otherwise well-preserved joint, left knee without evidence of complication status post megaprosthesis total knee arthroplasty    I personally reviewed and interpreted the radiographs.   Assessment and Plan:   78 y.o. male with left shoulder pain consistent with calcific tendinitis which has been persistently painful.  He is electing for an injection today.  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  Given this I would like to engage him with Norma Beckers for osteoporosis workup as we did briefly discuss the potentially disastrous effects of periprosthetic fracture to have on him.  I will plan to see him back in 6 weeks to check his progress  -Return to clinic 6 weeks for reassessment     Procedure Note  Patient: Joshua Vargas             Date of Birth: [birth year]           MRN: [id number]             Visit Date: 09/28/2023  Procedures: Visit Diagnoses:  1. Chronic pain of left knee     Large Joint Inj: L subacromial bursa on 09/28/2023 11:50 AM Indications: pain  Details: 22 G 1.5 in needle, ultrasound-guided anterior approach  Arthrogram: No  Medications: 4 mL lidocaine  1 %; 80 mg triamcinolone acetonide 40 MG/ML Outcome: tolerated well, no immediate complications Procedure, treatment alternatives, risks and  benefits explained, specific risks discussed. Consent was given by the patient. Immediately prior to procedure a time out was called to verify the correct patient, procedure, equipment, support staff and site/side marked as required. Patient was prepped and draped in the usual sterile fashion.       I personally saw and evaluated the patient, and participated in the management and treatment plan.  Wilhelmenia Harada, MD Attending Physician, Orthopedic Surgery  This document was dictated using Dragon voice recognition software. A reasonable attempt at proof reading has been made to minimize errors.

## 2023-10-09 ENCOUNTER — Telehealth: Payer: Self-pay | Admitting: Pharmacist

## 2023-10-09 DIAGNOSIS — E1165 Type 2 diabetes mellitus with hyperglycemia: Secondary | ICD-10-CM

## 2023-10-09 DIAGNOSIS — E782 Mixed hyperlipidemia: Secondary | ICD-10-CM

## 2023-10-09 NOTE — Progress Notes (Addendum)
   10/09/2023  Patient ID: Joshua Vargas, male   DOB: 04-18-1945, 78 y.o.   MRN: [id number]  Patient appeared on insurance report for at-risk for failing 2025 metric: Medication Adherence for Cholesterol (MAC)   Medication: Pravastatin  40mg  Last fill date: 1/29 90DS  Fail date: 10/14/23 (not yet)- likely fail  Called pharmacy to have medication refilled. Unknown why he hasn't been taking it.  Appears these are the only meds not at [facility]. Likely just needs refill sent over as transition did not follow properly. Will ask Dr. Mercie about sending refills for both the Pravastatin  and Metformin .    Aloysius Lewis, PharmD [facility]  Phone Number: [phone]

## 2023-10-24 MED ORDER — METFORMIN HCL 1000 MG PO TABS: 1000.0000 mg | ORAL_TABLET | Freq: Two times a day (BID) | ORAL | 3 refills | Status: DC

## 2023-10-24 MED ORDER — PRAVASTATIN SODIUM 40 MG PO TABS: 40.0000 mg | ORAL_TABLET | Freq: Every day | ORAL | 3 refills | Status: AC

## 2023-10-24 NOTE — Addendum Note (Signed)
 Addended by: CLEOTILDE ROLIN RAMAN on: 10/24/2023 07:58 AM   Modules accepted: Orders

## 2023-10-24 NOTE — Telephone Encounter (Signed)
 Requested Prescriptions   Signed Prescriptions Disp Refills   metFORMIN  (GLUCOPHAGE ) 1000 MG tablet 180 tablet 3    Sig: Take 1 tablet (1,000 mg total) by mouth 2 (two) times daily with a meal.    Authorizing Provider: THAPA, IRAQ    Ordering User: Hamlet Lasecki S   pravastatin  (PRAVACHOL ) 40 MG tablet 90 tablet 3    Sig: Take 1 tablet (40 mg total) by mouth daily.    Authorizing Provider: THAPA, IRAQ    Ordering User: CLEOTILDE ROLIN GORMAN MERLINDA

## 2023-11-15 ENCOUNTER — Ambulatory Visit: Admitting: Podiatry

## 2023-11-15 ENCOUNTER — Encounter: Payer: Self-pay | Admitting: Podiatry

## 2023-11-15 DIAGNOSIS — M79675 Pain in left toe(s): Secondary | ICD-10-CM

## 2023-11-15 DIAGNOSIS — B351 Tinea unguium: Secondary | ICD-10-CM

## 2023-11-15 DIAGNOSIS — E1140 Type 2 diabetes mellitus with diabetic neuropathy, unspecified: Secondary | ICD-10-CM

## 2023-11-15 DIAGNOSIS — M79674 Pain in right toe(s): Secondary | ICD-10-CM

## 2023-11-19 NOTE — Progress Notes (Signed)
  Subjective:  Patient ID: Joshua Vargas, male    DOB: 15-Apr-1945,  MRN: [id number]  Joshua Vargas presents to clinic today for at risk foot care with history of diabetic neuropathy and painful mycotic toenails x 10 which interfere with daily activities. Pain is relieved with periodic professional debridement.  Chief Complaint  Patient presents with   Diabetes    DFC NIDDM A1C 8.8. Toenail trim.LOV with PCP 06/2023. Discolored toenail.   New problem(s): None.   PCP is Seabron Lenis, MD.  Allergies  Allergen Reactions   Atorvastatin Itching    Other reaction(s): Other, Unknown Other reaction(s): increased LFT's   Dextran Rash    Other Reaction: Not Assessed Other reaction(s): Other Other Reaction: Not Assessed Other Reaction: Not Assessed Other Reaction: Not Assessed    Review of Systems: Negative except as noted in the HPI.  Objective: No changes noted in today's physical examination. There were no vitals filed for this visit. Joshua Vargas is a pleasant 78 y.o. male WD, WN in NAD. AAO x 3.  Vascular Examination: Capillary refill time immediate b/l. Palpable pedal pulses. Pedal hair present b/l. Pedal edema absent. No pain with calf compression b/l. Skin temperature gradient WNL b/l. No cyanosis or clubbing b/l. No ischemia or gangrene noted b/l.   Neurological Examination: Pt has subjective symptoms of neuropathy. Protective sensation intact 5/5 intact bilaterally with 10g monofilament b/l.  Dermatological Examination: Pedal skin with normal turgor, texture and tone b/l.  No open wounds. No interdigital macerations.   Toenails 1-5 b/l thick, discolored, elongated with subungual debris and pain on dorsal palpation.   No corns, calluses, nor porokeratotic lesions.  Musculoskeletal Examination: Muscle strength 5/5 to all lower extremity muscle groups bilaterally. No gross bony deformities bilaterally.  Radiographs: None  Last A1c:      Latest Ref Rng & Units  08/07/2023    8:07 AM 05/10/2023    8:27 AM  Hemoglobin A1C  Hemoglobin-A1c <5.7 % 8.8  7.8     Assessment/Plan: 1. Pain due to onychomycosis of toenails of both feet   2. Type 2 diabetes mellitus with diabetic neuropathy, unspecified whether long term insulin  use (HCC)   Patient was evaluated and treated. All patient's and/or POA's questions/concerns addressed on today's visit. Mycotic toenails 1-5 debrided in length and girth without incident.  Continue daily foot inspections and monitor blood glucose per PCP/Endocrinologist's recommendations.Continue soft, supportive shoe gear daily. Report any pedal injuries to medical professional. Call office if there are any quesitons/concerns. -Patient/POA to call should there be question/concern in the interim.   No follow-ups on file.  Joshua Vargas, DPM      [place] LOCATION: [address], [place] [address]                   Office [phone]   [facility] LOCATION: [address] [place], [place] [address] Office [phone]

## 2023-12-01 ENCOUNTER — Ambulatory Visit: Payer: Self-pay | Admitting: Endocrinology

## 2023-12-01 ENCOUNTER — Encounter: Payer: Self-pay | Admitting: Endocrinology

## 2023-12-01 ENCOUNTER — Ambulatory Visit: Admitting: Endocrinology

## 2023-12-01 VITALS — BP 156/70 | HR 78 | Resp 20 | Ht 70.0 in | Wt 200.4 lb

## 2023-12-01 DIAGNOSIS — Z7984 Long term (current) use of oral hypoglycemic drugs: Secondary | ICD-10-CM | POA: Diagnosis not present

## 2023-12-01 DIAGNOSIS — D352 Benign neoplasm of pituitary gland: Secondary | ICD-10-CM | POA: Diagnosis not present

## 2023-12-01 DIAGNOSIS — E1165 Type 2 diabetes mellitus with hyperglycemia: Secondary | ICD-10-CM

## 2023-12-01 LAB — POCT GLYCOSYLATED HEMOGLOBIN (HGB A1C): Hemoglobin A1C: 7.6 % — AB (ref 4.0–5.6)

## 2023-12-01 MED ORDER — METFORMIN HCL 1000 MG PO TABS: 1000.0000 mg | ORAL_TABLET | Freq: Two times a day (BID) | ORAL | 3 refills | Status: AC

## 2023-12-01 MED ORDER — PIOGLITAZONE HCL 30 MG PO TABS: 30.0000 mg | ORAL_TABLET | Freq: Every day | ORAL | 3 refills | Status: AC

## 2023-12-01 MED ORDER — GLIMEPIRIDE 2 MG PO TABS: 4.0000 mg | ORAL_TABLET | Freq: Every day | ORAL | 3 refills | Status: DC

## 2023-12-01 NOTE — Progress Notes (Signed)
 Outpatient Endocrinology Note Joshua Shandelle Borrelli, MD   Patient's Name: Joshua Vargas    DOB: Jan 20, 1946    MRN: [id number]                                                    REASON OF VISIT: Follow up for type 2 diabetes mellitus  PCP: Seabron Lenis, MD  HISTORY OF PRESENT ILLNESS:   Joshua Vargas is a 78 y.o. old male with past medical history listed below, is here for follow up for type 2 diabetes mellitus.   Pertinent Diabetes History: Patient was previously seen by Dr. Von and was last time seen in February 2024.  Patient was diagnosed with type 2 diabetes mellitus in 2002.  Patient has uncontrolled type 2 diabetes mellitus.  No personal history of pancreatitis and / or family history of medullary thyroid  carcinoma or MEN 2B syndrome.   Chronic Diabetes Complications : Retinopathy: no. Last ophthalmology exam was done on 10/2022, annually, following with ophthalmology regularly.  Nephropathy: ? CKD, on ACE/ARB / irbesartan  Peripheral neuropathy: mild yes, tingling  Coronary artery disease: yes Stroke: no  Relevant comorbidities and cardiovascular risk factors: Obesity: no Body mass index is 28.75 kg/m.  Hypertension: Yes  Hyperlipidemia : Yes, on statin  Lipids: He had been on Crestor  and simvastatin  and these were stopped because of increased liver functions Because of significantly high lipids he has been on 40 mg pravastatin   LDL has been well controlled.  Current / Home Diabetic regimen includes:  Metformin  1000 mg 2 times a day. Amaryl  2 mg daily. Actos  30 mg daily.  Prior diabetic medications: Trulicity  up to 1.5 mg weekly in the past, stopped due to not covered by insurance at that time, due to donut hole.  He used to be on Victoza  switched to Trulicity .  Nausea with Victoza  1.2 mg in the past.  Trulicity  and Jardiance  not cost effective.  Glycemic data:   He has not been checking blood sugar at home.  Hypoglycemia: Patient has no hypoglycemic episodes.  Patient has hypoglycemia awareness.  Factors modifying glucose control: 1.  Diabetic diet assessment: 3 meals a day.  2.  Staying active or exercising:   3.  Medication compliance: compliant most of the time.  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 He has been taken off his medication, previously because of his noncompliance with this prolactin consistently normal even without cabergoline .  He had taken cabergoline  onto the beginning of 2022.  Testosterone  level has been normal with normalization of prolactin  Interval history  Hemoglobin A1c improved to 7.6%.  He has been checking blood sugar rarely at home although the data reviewed.  Denies hypoglycemic symptoms.  He is not able to afford Trulicity  and Jardiance  status planning last visit, has not been taking these medications.  Current diabetes medication as reviewed above.  He has been eating bread for the meals.  No other complaints today.  REVIEW OF SYSTEMS As per history of present illness.   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   History of colonic polyps    Hyperlipemia    Hypertension    Knee pain    Memory loss    Paresthesia    Pneumonia 1990s X 1; ~ 2000 X 1   Prolactinoma (HCC)    Type II diabetes mellitus (HCC)    Vitreous floaters of right eye     PAST SURGICAL HISTORY: Past Surgical History:  Procedure Laterality Date   CARDIAC CATHETERIZATION  ?2006; 01/25/2018   ELBOW SURGERY Left    something to do w/the muscle   JOINT REPLACEMENT     LEFT HEART CATH AND CORONARY ANGIOGRAPHY N/A 01/26/2018   Procedure: LEFT HEART CATH AND CORONARY  ANGIOGRAPHY;  Joshua: Vargas, Peter M, MD;  Location: [facility];  Service: Cardiovascular;  Laterality: N/A;   PICC LINE INSERTION  ~ 2005   took it out months later   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   TOTAL KNEE ARTHROPLASTY Left 2005   TRIGGER FINGER RELEASE Bilateral ~ 2016    ALLERGIES: Allergies  Allergen Reactions   Atorvastatin Itching    Other reaction(s): Other, Unknown Other reaction(s): increased LFT's   Dextran Rash    Other Reaction: Not Assessed Other reaction(s): Other Other Reaction: Not Assessed Other Reaction: Not Assessed Other Reaction: Not Assessed    FAMILY HISTORY:  Family History  Problem Relation Age of Onset   Cancer Father    Diabetes Brother    Heart attack Brother 9   Allergic rhinitis Neg Hx    Angioedema Neg Hx    Asthma Neg Hx    Atopy Neg Hx    Eczema Neg Hx    Immunodeficiency Neg Hx    Urticaria Neg Hx     SOCIAL HISTORY: Social History   Socioeconomic History   Marital status: Married    Spouse name: Not on file   Number of children: Not on file   Years of education: Not on file   Highest education level: Not on file  Occupational History   Not on file  Tobacco Use   Smoking status: Never   Smokeless tobacco: Never  Vaping Use   Vaping status: Never Used  Substance and Sexual Activity   Alcohol use: Never   Drug use: Never   Sexual activity: Yes  Other Topics Concern   Not on file  Social History Narrative   Not on file   Social Drivers of Health   Financial Resource Strain: Low Risk  (02/15/2022)   Received from [facility]   Overall Financial Resource Strain (CARDIA)    Difficulty of Paying Living Expenses: Not very hard  Food Insecurity: No Food Insecurity (02/15/2022)   Received from [facility]   Hunger Vital Sign    Within the past 12 months, you worried that your food would run out before you got the money to buy more.: Never true    Within the past 12 months,  the food you bought just didn't last and you didn't have money to get more.: Never true  Transportation Needs: Not on file  Physical Activity: Sufficiently Active (02/15/2022)   Received from [facility]   Exercise Vital Sign    On average, how many days per week do you engage in moderate to strenuous exercise (like a brisk walk)?: 4 days    On average, how many minutes do you  engage in exercise at this level?: 60 min  Stress: No Stress Concern Present (02/15/2022)   Received from [facility] - Occupational Stress Questionnaire    Feeling of Stress : Not at all  Social Connections: Socially Integrated (02/15/2022)   Received from [facility]   Social Network    How would you rate your social network (family, work, friends)?: Good participation with social networks    MEDICATIONS:  Current Outpatient Medications  Medication Sig Dispense Refill   acetaminophen  (TYLENOL ) 500 MG tablet Take 500 mg by mouth as needed. pain     albuterol  (VENTOLIN  HFA) 108 (90 Base) MCG/ACT inhaler Inhale 1-2 puffs into the lungs every 6 (six) hours as needed for wheezing or shortness of breath. 1 each 0   amLODipine  (NORVASC ) 10 MG tablet TAKE 1 TABLET EVERY DAY 90 tablet 3   aspirin  81 MG EC tablet Take 81 mg by mouth daily.     augmented betamethasone dipropionate (DIPROLENE-AF) 0.05 % cream APPLY SPARINGLY TO AFFECTED AREA TWICE A DAY     Blood Glucose Monitoring Suppl (ACCU-CHEK GUIDE) w/Device KIT 1 Device by Does not apply route daily. 1 kit 0   Blood Glucose Monitoring Suppl DEVI 1 each by Does not apply route in the morning, at noon, and at bedtime. May substitute to any manufacturer covered by patient's insurance. 1 each 0   carvedilol  (COREG ) 12.5 MG tablet Take 1 tablet (12.5 mg total) by mouth 2 (two) times daily. 180 tablet 2   chlorthalidone  (HYGROTON ) 25 MG tablet TAKE 1 TABLET EVERY DAY 90 tablet 3   Cholecalciferol (VITAMIN D3) 10 MCG (400 UNIT)  CAPS SMARTSIG:1 Capsule(s) By Mouth     ciclopirox (PENLAC) 8 % solution SMARTSIG:1 sparingly Topical Every Night     GEMTESA 75 MG TABS Take 1 tablet by mouth daily.     glucose blood (ACCU-CHEK GUIDE) test strip Use as instructed 100 each 12   Glucose Blood (BLOOD GLUCOSE TEST STRIPS) STRP 1 each by In Vitro route daily. May substitute to any manufacturer covered by patient's insurance. 100 each 3   irbesartan  (AVAPRO ) 300 MG tablet TAKE 1 TABLET EVERY DAY 90 tablet 3   Lancets Misc. MISC 1 each by Does not apply route daily. May substitute to any manufacturer covered by patient's insurance. 100 each 3   latanoprost  (XALATAN ) 0.005 % ophthalmic solution SMARTSIG:In Eye(s)     methylPREDNISolone  (MEDROL  DOSEPAK) 4 MG TBPK tablet See pack instructions 21 each 0   oxyCODONE  (ROXICODONE ) 5 MG immediate release tablet Take 0.5 tablets (2.5 mg total) by mouth every 6 (six) hours as needed for severe pain (pain score 7-10) or breakthrough pain. 12 tablet 0   potassium chloride  (KLOR-CON  M) 10 MEQ tablet TAKE 1 TABLET EVERY DAY 90 tablet 0   pravastatin  (PRAVACHOL ) 40 MG tablet Take 1 tablet (40 mg total) by mouth daily. 90 tablet 3   tadalafil  (CIALIS ) 5 MG tablet TAKE 1 TABLET EVERY DAY AS NEEDED FOR ERECTILE DYSFUNCTION 30 tablet 0   tamsulosin (FLOMAX) 0.4 MG CAPS capsule Take 0.4 mg by mouth daily.     terbinafine (LAMISIL) 250 MG tablet SMARTSIG:1 Tablet(s) By Mouth     glimepiride  (AMARYL ) 2 MG tablet Take 2 tablets (4 mg total) by mouth daily. 180 tablet 3   metFORMIN  (GLUCOPHAGE ) 1000 MG tablet Take 1 tablet (1,000 mg total) by mouth 2 (two) times daily with a meal. 180 tablet 3   pioglitazone  (ACTOS )  30 MG tablet Take 1 tablet (30 mg total) by mouth daily. 90 tablet 3   No current facility-administered medications for this visit.    PHYSICAL EXAM: Vitals:   12/01/23 0814 12/01/23 0815  BP: (!) 158/70 (!) 156/70  Pulse: 78   Resp: 20   SpO2: 99%   Weight: 200 lb 6.4 oz (90.9 kg)    Height: 5' 10 (1.778 m)     Body mass index is 28.75 kg/m.  Wt Readings from Last 3 Encounters:  12/01/23 200 lb 6.4 oz (90.9 kg)  09/11/23 200 lb (90.7 kg)  08/27/23 200 lb (90.7 kg)    General: Well developed, well nourished male in no apparent distress.  HEENT: AT/[place], no external lesions.  Eyes: Conjunctiva clear and no icterus. Neck: Neck supple  Lungs: Respirations not labored Neurologic: Alert, oriented, normal speech Extremities / Skin: Dry.  Psychiatric: Does not appear depressed or anxious  Diabetic Foot Exam - Simple   No data filed    LABS Reviewed Lab Results  Component Value Date   HGBA1C 7.6 (A) 12/01/2023   HGBA1C 8.8 (H) 08/07/2023   HGBA1C 7.8 (A) 05/10/2023   Lab Results  Component Value Date   FRUCTOSAMINE 259 11/28/2017   FRUCTOSAMINE 261 07/13/2016   Lab Results  Component Value Date   CHOL 186 08/07/2023   HDL 58 08/07/2023   LDLCALC 96 08/07/2023   LDLDIRECT 60.0 09/01/2022   TRIG 228 (H) 08/07/2023   CHOLHDL 3.2 08/07/2023   Lab Results  Component Value Date   MICRALBCREAT 33 (H) 08/07/2023   Lab Results  Component Value Date   CREATININE 1.06 08/31/2023   Lab Results  Component Value Date   GFR 57.66 (L) 09/01/2022    ASSESSMENT / PLAN  1. Uncontrolled type 2 diabetes mellitus with hyperglycemia, without long-term current use of insulin  (HCC)   2. Prolactinoma (HCC)     Diabetes Mellitus type 2, complicated by mild neuropathy, CAD, ?  CKD. - Diabetic status / severity: Uncontrolled.  Improving  Lab Results  Component Value Date   HGBA1C 7.6 (A) 12/01/2023    - Hemoglobin A1c goal : <7%  Cost has been the limitation for some of the medications.  Diabetes control improving however he still elevated A1c.  No glucose data to review.   Adjusted diabetes regimen as follows.  - Medications:   Continue pioglitazone /Actos  30 mg daily. Increase glimepiride /Amaryl  from 2 to 4 mg daily. Continue metformin  1000 mg 2  times a day.  Better to take with meal.  - Home glucose testing: Check in the morning fasting and at bedtime.  Patient has glucometer and test supplies. - Discussed/ Gave Hypoglycemia treatment plan.  # Consult : not required at this time.   # Annual urine for microalbuminuria/ creatinine ratio, + microalbuminuria currently, continue ACE/ARB /irbesartan . Last  Lab Results  Component Value Date   MICRALBCREAT 33 (H) 08/07/2023    # Foot check nightly / neuropathy.  # Annual dilated diabetic eye exams.   - Diet: Make healthy diabetic food choices - Life style / activity / exercise: Discussed.  2. Blood pressure  -  BP Readings from Last 1 Encounters:  12/01/23 (!) 156/70    - Control is not in target. Mildly elevated, asked to monitor at home, if still high advised to discuss with primary care provider.  Patient reports he did not take blood pressure medication yesterday and this morning.  Advised for compliance. - No change in current plans.  3. Lipid status / Hyperlipidemia - Last  Lab Results  Component Value Date   LDLCALC 96 08/07/2023   - Continue pravastatin  40 mg daily.  # Prolactinoma/hyperprolactinemia -He was diagnosed with prolactinoma in 2007.  He was on cabergoline  until beginning of 2022.  He was not fully compliant in the past.  His prolactin has remained normal without cabergoline . - Will check prolactin annually.  Prolactin is normal in April 2025.  Diagnoses and all orders for this visit:  Uncontrolled type 2 diabetes mellitus with hyperglycemia, without long-term current use of insulin  (HCC) -     POCT glycosylated hemoglobin (Hb A1C) -     glimepiride  (AMARYL ) 2 MG tablet; Take 2 tablets (4 mg total) by mouth daily. -     metFORMIN  (GLUCOPHAGE ) 1000 MG tablet; Take 1 tablet (1,000 mg total) by mouth 2 (two) times daily with a meal. -     pioglitazone  (ACTOS ) 30 MG tablet; Take 1 tablet (30 mg total) by mouth daily.  Prolactinoma  (HCC)    DISPOSITION Follow up in clinic in 3 - 4 months suggested.     All questions answered and patient verbalized understanding of the plan.  Joshua Margree Gimbel, MD [facility] Endocrinology [facility] [address] [place], [address] [place], [place] [address] Phone # [phone]  At least part of this note was generated using voice recognition software. Inadvertent word errors may have occurred, which were not recognized during the proofreading process.

## 2023-12-26 DIAGNOSIS — H532 Diplopia: Secondary | ICD-10-CM | POA: Diagnosis not present

## 2024-01-05 DIAGNOSIS — E7800 Pure hypercholesterolemia, unspecified: Secondary | ICD-10-CM | POA: Diagnosis not present

## 2024-01-05 DIAGNOSIS — D352 Benign neoplasm of pituitary gland: Secondary | ICD-10-CM | POA: Diagnosis not present

## 2024-01-05 DIAGNOSIS — Z23 Encounter for immunization: Secondary | ICD-10-CM | POA: Diagnosis not present

## 2024-01-05 DIAGNOSIS — E1169 Type 2 diabetes mellitus with other specified complication: Secondary | ICD-10-CM | POA: Diagnosis not present

## 2024-01-05 DIAGNOSIS — I10 Essential (primary) hypertension: Secondary | ICD-10-CM | POA: Diagnosis not present

## 2024-01-05 DIAGNOSIS — R0602 Shortness of breath: Secondary | ICD-10-CM | POA: Diagnosis not present

## 2024-01-05 DIAGNOSIS — G4730 Sleep apnea, unspecified: Secondary | ICD-10-CM | POA: Diagnosis not present

## 2024-01-05 DIAGNOSIS — R5383 Other fatigue: Secondary | ICD-10-CM | POA: Diagnosis not present

## 2024-01-05 DIAGNOSIS — R413 Other amnesia: Secondary | ICD-10-CM | POA: Diagnosis not present

## 2024-01-09 ENCOUNTER — Other Ambulatory Visit: Payer: Self-pay | Admitting: Cardiology

## 2024-01-10 ENCOUNTER — Other Ambulatory Visit: Payer: Self-pay | Admitting: Cardiology

## 2024-01-26 DIAGNOSIS — R3911 Hesitancy of micturition: Secondary | ICD-10-CM | POA: Diagnosis not present

## 2024-01-26 DIAGNOSIS — R6882 Decreased libido: Secondary | ICD-10-CM | POA: Diagnosis not present

## 2024-01-26 DIAGNOSIS — R351 Nocturia: Secondary | ICD-10-CM | POA: Diagnosis not present

## 2024-01-26 DIAGNOSIS — R3915 Urgency of urination: Secondary | ICD-10-CM | POA: Diagnosis not present

## 2024-01-26 DIAGNOSIS — R3912 Poor urinary stream: Secondary | ICD-10-CM | POA: Diagnosis not present

## 2024-01-26 DIAGNOSIS — N5201 Erectile dysfunction due to arterial insufficiency: Secondary | ICD-10-CM | POA: Diagnosis not present

## 2024-01-26 DIAGNOSIS — N401 Enlarged prostate with lower urinary tract symptoms: Secondary | ICD-10-CM | POA: Diagnosis not present

## 2024-01-26 DIAGNOSIS — R399 Unspecified symptoms and signs involving the genitourinary system: Secondary | ICD-10-CM | POA: Diagnosis not present

## 2024-01-30 NOTE — Progress Notes (Addendum)
 Cardiology Office Note:    Date:  01/31/2024   ID:  Rome CROME Raschke, DOB Dec 28, 1945, MRN [id number]  PCP:  Seabron Lenis, MD   [facility] Providers Cardiologist:  Wilbert Bihari, MD     Referring MD: Seabron Lenis, MD   Chief complaint: Annual follow-up of CAD     History of Present Illness:   Ekam Besson Corrigan is a 78 y.o. African-American male with a history of CAD, hyperlipidemia, hypertension and diabetes mellitus presenting to office today for annual follow-up of CAD.  He was dx with CAD in 2019 at which time he was hospitalized with chest pain underwent cardiac cath showing a 70% D1, 25% proximal LAD and normal LV function.  Medical management was recommended, lost to cardiology follow-up.  Referred to cardiology for evaluation of snoring and hypertension and seen by Dr. Bihari on 05/25/2021. Sleep study 06/01/2021 revealed mild obstructive sleep apnea, AHI 12.1/hr. Advised to wear CPAP, however it appears he did not follow-up on getting equipment. History of poorly controlled blood pressure complicated by medication noncompliance, with BPs as high as 210/88 mmHg at home as reported from prior office visits.    Complained of chest pain at prior visit with Dr. Bihari on 01/02/2023.  NM PET stress test performed on 01/24/2023: Showed normal, low risk study, moderate coronary calcifications present in the LAD and LCx.  Patient presents alone to the clinic today, appears stable from a cardiovascular standpoint.He denies chest pain, dyspnea, n, v,  dark/tarry/bloody stools, hematuria, syncope, edema, weight gain.  Has noticed a decrease in stamina and increased fatigue over the last 6-8 months.  Prior to this he had energy to do large tasks around the house, now is less inclined to take on large projects secondary to fatigue.  Reports waking from sleep feeling short of breath, noticing heart palpitations that make him too uncomfortable to be able to fall back asleep.  Unable to get  restful sleep because of this.  Able to sleep better in a recliner.  Reports occasional lightheadedness when bending over, needs to take positional changes slowly to avoid this, states this is manageable/tolerable.  Reports BPs well-controlled at home on current medication regimen.  Previously was not interested in CPAP machine secondary to cost as he is on a fixed income.  ROS:   Please see the history of present illness.    All other systems reviewed and are negative.     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   History of colonic polyps    Hyperlipemia    Hypertension    Knee pain    Memory loss    Paresthesia    Pneumonia 1990s X 1; ~ 2000 X 1   Prolactinoma (HCC)    Type II diabetes mellitus (HCC)    Vitreous floaters of right eye     Past Surgical History:  Procedure Laterality Date   CARDIAC CATHETERIZATION  ?2006; 01/25/2018   ELBOW SURGERY Left    something to do w/the muscle   JOINT REPLACEMENT     LEFT HEART CATH AND CORONARY ANGIOGRAPHY N/A 01/26/2018   Procedure: LEFT HEART CATH AND CORONARY ANGIOGRAPHY;  Amenta: Jordan, Peter M, MD;  Location: [facility]  INVASIVE CV LAB;  Service: Cardiovascular;  Laterality: N/A;   PICC LINE INSERTION  ~ 2005   took it out months later   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 pain   albuterol  (VENTOLIN  HFA) 108 (90 Base) MCG/ACT inhaler Inhale 1-2 puffs into the lungs every 6 (six) hours as needed for wheezing or shortness of breath.   amLODipine  (NORVASC )  10 MG tablet TAKE 1 TABLET EVERY DAY   aspirin  81 MG EC tablet Take 81 mg by mouth daily.   augmented betamethasone dipropionate (DIPROLENE-AF) 0.05 % cream APPLY SPARINGLY TO AFFECTED AREA TWICE A DAY   Blood Glucose Monitoring Suppl (ACCU-CHEK GUIDE) w/Device KIT 1 Device by Does not apply route daily.   Blood Glucose Monitoring Suppl DEVI 1 each by Does not apply route in the morning, at noon, and at bedtime. May substitute to any manufacturer covered by patient's insurance.   carvedilol  (COREG ) 12.5 MG tablet Take 1 tablet (12.5 mg total) by mouth 2 (two) times daily.   chlorthalidone  (HYGROTON ) 25 MG tablet TAKE 1 TABLET EVERY DAY   Cholecalciferol (VITAMIN D3) 10 MCG (400 UNIT) CAPS SMARTSIG:1 Capsule(s) By Mouth   ciclopirox (PENLAC) 8 % solution SMARTSIG:1 sparingly Topical Every Night   GEMTESA 75 MG TABS Take 1 tablet by mouth daily.   glimepiride  (AMARYL ) 2 MG tablet Take 2 tablets (4 mg total) by mouth daily.   glucose blood (ACCU-CHEK GUIDE) test strip Use as instructed   Glucose Blood (BLOOD GLUCOSE TEST STRIPS) STRP 1 each by In Vitro route daily. May substitute to any manufacturer covered by patient's insurance.   irbesartan  (AVAPRO ) 300 MG tablet TAKE 1 TABLET EVERY DAY   Lancets Misc. MISC 1 each by Does not apply route daily. May substitute to any manufacturer covered by patient's insurance.   latanoprost  (XALATAN ) 0.005 % ophthalmic solution SMARTSIG:In Eye(s)   metFORMIN  (GLUCOPHAGE ) 1000 MG tablet Take 1 tablet (1,000 mg total) by mouth 2 (two) times daily with a meal.   methylPREDNISolone  (MEDROL  DOSEPAK) 4 MG TBPK tablet See pack instructions   oxyCODONE  (ROXICODONE ) 5 MG immediate release tablet Take 0.5 tablets (2.5 mg total) by mouth every 6 (six) hours as needed for severe pain (pain score 7-10) or breakthrough pain.   pioglitazone  (ACTOS ) 30 MG tablet Take 1 tablet (30 mg total) by mouth daily.   pravastatin  (PRAVACHOL ) 40 MG tablet Take 1 tablet (40 mg total) by  mouth daily.   tadalafil  (CIALIS ) 5 MG tablet TAKE 1 TABLET EVERY DAY AS NEEDED FOR ERECTILE DYSFUNCTION   tamsulosin (FLOMAX) 0.4 MG CAPS capsule Take 0.4 mg by mouth daily.   terbinafine (LAMISIL) 250 MG tablet SMARTSIG:1 Tablet(s) By Mouth     Allergies:   Atorvastatin and Dextran   Social History   Socioeconomic History   Marital status: Married    Spouse name: Not on file   Number of children: Not on file   Years of education: Not on file   Highest education level: Not on file  Occupational History   Not on file  Tobacco Use   Smoking status: Never   Smokeless tobacco: Never  Vaping Use   Vaping status: Never Used  Substance and Sexual Activity   Alcohol use: Never   Drug  use: Never   Sexual activity: Yes  Other Topics Concern   Not on file  Social History Narrative   Not on file   Social Drivers of Health   Financial Resource Strain: Low Risk  (02/15/2022)   Received from [facility]   Overall Financial Resource Strain (CARDIA)    Difficulty of Paying Living Expenses: Not very hard  Food Insecurity: No Food Insecurity (02/15/2022)   Received from [facility]   Hunger Vital Sign    Within the past 12 months, you worried that your food would run out before you got the money to buy more.: Never true    Within the past 12 months, the food you bought just didn't last and you didn't have money to get more.: Never true  Transportation Needs: Not on file  Physical Activity: Sufficiently Active (02/15/2022)   Received from [facility]   Exercise Vital Sign    On average, how many days per week do you engage in moderate to strenuous exercise (like a brisk walk)?: 4 days    On average, how many minutes do you engage in exercise at this level?: 60 min  Stress: No Stress Concern Present (02/15/2022)   Received from [facility] - Occupational Stress Questionnaire    Feeling of Stress : Not at all  Social Connections: Socially  Integrated (02/15/2022)   Received from [facility]   Social Network    How would you rate your social network (family, work, friends)?: Good participation with social networks     Family History: The patient's family history includes Cancer in his father; Diabetes in his brother; Heart attack (age of onset: 19) in his brother. There is no history of Allergic rhinitis, Angioedema, Asthma, Atopy, Eczema, Immunodeficiency, or Urticaria.  EKGs/Labs/Other Studies Reviewed:    The following studies were reviewed today:  EKG Interpretation Date/Time:  Wednesday January 31 2024 08:39:12 EDT Ventricular Rate:  76 PR Interval:  172 QRS Duration:  92 QT Interval:  400 QTC Calculation: 450 R Axis:   8  Text Interpretation: Normal sinus rhythm Moderate voltage criteria for LVH, may be normal variant ( R in aVL , Sokolow-Lyon ) Nonspecific ST abnormality When compared with ECG of 26-Jan-2018 06:50, No significant change was found Confirmed by Elaine Moloney [phone]) on 01/31/2024 12:05:51 PM    Recent Labs: 08/31/2023: BUN 24; Creat 1.06; Potassium 3.6; Sodium 137  Recent Lipid Panel    Component Value Date/Time   CHOL 186 08/07/2023 0807   TRIG 228 (H) 08/07/2023 0807   HDL 58 08/07/2023 0807   CHOLHDL 3.2 08/07/2023 0807   VLDL 50.2 (H) 09/01/2022 0807   LDLCALC 96 08/07/2023 0807   LDLDIRECT 60.0 09/01/2022 0807     Physical Exam:    VS:  BP 132/68 (BP Location: Right Arm, Patient Position: Sitting, Cuff Size: Normal)   Pulse 78   Resp 16   Ht 5' 10 (1.778 m)   Wt 202 lb (91.6 kg)   SpO2 97%   BMI 28.98 kg/m        Wt Readings from Last 3 Encounters:  01/31/24 202 lb (91.6 kg)  12/01/23 200 lb 6.4 oz (90.9 kg)  09/11/23 200 lb (90.7 kg)     GEN:  Well nourished, well developed in no acute distress HEENT: Normal NECK: No carotid bruits CARDIAC:  S1-S2 normal, RRR with some irregularities, no murmurs, rubs, gallops RESPIRATORY:  Clear to auscultation without rales,  wheezing or  rhonchi  MUSCULOSKELETAL:  No edema; No deformity  SKIN: Warm and dry NEUROLOGIC:  Alert and oriented x 3 PSYCHIATRIC:  Normal affect       Assessment & Plan Other fatigue Palpitations Reports increased fatigue and palpitations, as well as PND and mild orthopnea 01/05/2024: BNP 5.6, appears euvolemic on exam Will order ZIO monitor to rule out arrhythmias/evaluate ectopy burden Will order echo to rule out cardiomyopathies/evaluate valvular status Symptoms do not appear ischemic in nature However, if the above tests are normal, could proceed with ischemic workup as possible anginal equivalent Coronary artery disease involving native coronary artery of native heart, unspecified whether angina present LHC 01/26/2018: D1 70%, proximal LAD 25%, LVEF 55-65% by visual estimate, unchanged since 2006, medical therapy recommended. NM stress test 01/24/2023: Normal, low risk, moderate coronary calcifications present in the LAD and LCx. Denies chest pain, shortness of breath, DOE, near-syncope EKG: normal sinus rhythm, moderate voltage criteria for LVH, may be normal variant, nonspecific ST abnormality when compared to previous EKG.  No significant change was found, 76 bpm Continue aspirin  EC 81 mg daily Continue carvedilol  12.5 mg twice daily Continue irbesartan  300 mg daily Continue pravastatin  40 mg daily Referring to lipid clinic for better cholesterol management, consideration of PCSK9 inhibitor Labs 01/05/2024: Hemoglobin 13.8, platelet 213 Primary hypertension BPs reported well-controlled at home Reports occasional lightheadedness on positional changes managed by taking position changes slowly Discussed orthostatic precautions Continue amlodipine  10 mg daily Continue carvedilol  12.5 mg twice daily Continue chlorthalidone  25 mg daily Continue irbesartan  300 mg daily Continue tamsulosin 0.4 mg daily Labs 01/05/2024: BUN 16, creatinine 1.12, sodium 139, potassium  3.9 Hyperlipidemia, unspecified hyperlipidemia type Lipid panel 01/05/2024: Cholesterol 205, HDL 58, triglyceride 265, LDL 102 LFTs 01/05/2024: AST 38, ALT 33 LDL goal <70 Previously failed simvastatin  and Crestor  due to elevated liver enzymes Referred to lipid clinic for potential start of PCSK9 inhibitor OSA (obstructive sleep apnea) Previously had home sleep study, found to have mild sleep apnea, CPAP was recommended Patient did not previously pursue obtaining CPAP given his fixed income and machine costs of around $1000. Discussed the CPAP assistance program with patient, as well as average cost of monthly supplies, patient was interested in moving forward with trying to obtain CPAP Will reach out to Dr. Shlomo to see which sleep study may be most appropriate for patient given his prior study was performed 2023  Follow-up with Dr. Shlomo in 4 months          Medication Adjustments/Labs and Tests Ordered: Current medicines are reviewed at length with the patient today.  Concerns regarding medicines are outlined above.  Orders Placed This Encounter  Procedures   AMB Referral to Advanced Lipid Disorders Clinic   LONG TERM MONITOR (3-14 DAYS)   EKG 12-Lead   ECHOCARDIOGRAM COMPLETE   No orders of the defined types were placed in this encounter.   Patient Instructions  Medication Instructions:    Your physician recommends that you continue on your current medications as directed. Please refer to the Current Medication list given to you today.  *If you need a refill on your cardiac medications before your next appointment, please call your pharmacy*  Lab Work: NONE ORDERED  TODAY    If you have labs (blood work) drawn today and your tests are completely normal, you will receive your results only by: MyChart Message (if you have MyChart) OR A paper copy in the mail If you have any lab test that is abnormal or we need  to change your treatment, we will call you to review the  results.   Testing/Procedures:  Your physician has requested that you have an echocardiogram. Echocardiography is a painless test that uses sound waves to create images of your heart. It provides your doctor with information about the size and shape of your heart and how well your hearts chambers and valves are working. This procedure takes approximately one hour. There are no restrictions for this procedure. Please do NOT wear cologne, perfume, aftershave, or lotions (deodorant is allowed). Please arrive 15 minutes prior to your appointment time.  Please note: We ask at that you not bring children with you during ultrasound (echo/ vascular) testing. Due to room size and safety concerns, children are not allowed in the ultrasound rooms during exams. Our front office staff cannot provide observation of children in our lobby area while testing is being conducted. An adult accompanying a patient to their appointment will only be allowed in the ultrasound room at the discretion of the ultrasound technician under special circumstances. We apologize for any inconvenience.   Your physician has recommended that you wear an event monitor. Event monitors are medical devices that record the hearts electrical activity. Doctors most often us  these monitors to diagnose arrhythmias. Arrhythmias are problems with the speed or rhythm of the heartbeat. The monitor is a small, portable device. You can wear one while you do your normal daily activities. This is usually used to diagnose what is causing palpitations/syncope (passing out).    Follow-Up: At [facility], you and your health needs are our priority.  As part of our continuing mission to provide you with exceptional heart care, our providers are all part of one team.  This team includes your primary Cardiologist (physician) and Advanced Practice Providers or APPs (Physician Assistants and Nurse Practitioners) who all work together to provide you with  the care you need, when you need it.  Your next appointment:   YOU 'VE BEEN REFFERED TO LIPID CLINIC FOR  HYPERLIPIDEMIA   4 month(s)  Provider:   Wilbert Bihari, MD     We recommend signing up for the patient portal called MyChart.  Sign up information is provided on this After Visit Summary.  MyChart is used to connect with patients for Virtual Visits (Telemedicine).  Patients are able to view lab/test results, encounter notes, upcoming appointments, etc.  Non-urgent messages can be sent to your provider as well.   To learn more about what you can do with MyChart, go to [email or web address].   Other Instructions     ZIO XT- Long Term Monitor Instructions  Your physician has requested you wear a ZIO patch monitor for  7  days.  This is a single patch monitor. [employer] supplies one patch monitor per enrollment. Additional stickers are not available. Please do not apply patch if you will be having a Nuclear Stress Test,  Echocardiogram, Cardiac CT, MRI, or Chest Xray during the period you would be wearing the  monitor. The patch cannot be worn during these tests. You cannot remove and re-apply the  ZIO XT patch monitor.  Your ZIO patch monitor will be mailed 3 day USPS to your address on file. It may take 3-5 days  to receive your monitor after you have been enrolled.  Once you have received your monitor, please review the enclosed instructions. Your monitor  has already been registered assigning a specific monitor serial # to you.  Billing and Patient Assistance Program Information  We  have supplied [employer] with any of your insurance information on file for billing purposes. [employer] offers a sliding scale Patient Assistance Program for patients that do not have  insurance, or whose insurance does not completely cover the cost of the ZIO monitor.  You must apply for the Patient Assistance Program to qualify for this discounted rate.  To apply, please call [employer] at [phone],  select option 4, select option 2, ask to apply for  Patient Assistance Program. Meredeth will ask your household income, and how many people  are in your household. They will quote your out-of-pocket cost based on that information.  [employer] will also be able to set up a 82-month, interest-free payment plan if needed.  Applying the monitor   Shave hair from upper left chest.  Hold abrader disc by orange tab. Rub abrader in 40 strokes over the upper left chest as  indicated in your monitor instructions.  Clean area with 4 enclosed alcohol pads. Let dry.  Apply patch as indicated in monitor instructions. Patch will be placed under collarbone on left  side of chest with arrow pointing upward.  Rub patch adhesive wings for 2 minutes. Remove white label marked 1. Remove the white  label marked 2. Rub patch adhesive wings for 2 additional minutes.  While looking in a mirror, press and release button in center of patch. A small green light will  flash 3-4 times. This will be your only indicator that the monitor has been turned on.  Do not shower for the first 24 hours. You may shower after the first 24 hours.  Press the button if you feel a symptom. You will hear a small click. Record Date, Time and  Symptom in the Patient Logbook.  When you are ready to remove the patch, follow instructions on the last 2 pages of Patient  Logbook. Stick patch monitor onto the last page of Patient Logbook.  Place Patient Logbook in the blue and white box. Use locking tab on box and tape box closed  securely. The blue and white box has prepaid postage on it. Please place it in the mailbox as  soon as possible. Your physician should have your test results approximately 7 days after the  monitor has been mailed back to [facility].  Call [facility] Customer Care at [phone] if you have questions regarding  your ZIO XT patch monitor. Call them immediately if you see an orange light blinking on your   monitor.  If your monitor falls off in less than 4 days, contact our Monitor department at [phone].  If your monitor becomes loose or falls off after 4 days call [employer] at [phone] for  suggestions on securing your monitor        Signed, Miriam FORBES Shams, NP  01/31/2024 12:05 PM    [facility]

## 2024-01-31 ENCOUNTER — Encounter: Payer: Self-pay | Admitting: Physician Assistant

## 2024-01-31 ENCOUNTER — Ambulatory Visit

## 2024-01-31 ENCOUNTER — Ambulatory Visit: Attending: Physician Assistant | Admitting: Emergency Medicine

## 2024-01-31 VITALS — BP 132/68 | HR 78 | Resp 16 | Ht 70.0 in | Wt 202.0 lb

## 2024-01-31 DIAGNOSIS — I2510 Atherosclerotic heart disease of native coronary artery without angina pectoris: Secondary | ICD-10-CM | POA: Diagnosis not present

## 2024-01-31 DIAGNOSIS — G4733 Obstructive sleep apnea (adult) (pediatric): Secondary | ICD-10-CM

## 2024-01-31 DIAGNOSIS — R002 Palpitations: Secondary | ICD-10-CM

## 2024-01-31 DIAGNOSIS — E785 Hyperlipidemia, unspecified: Secondary | ICD-10-CM

## 2024-01-31 DIAGNOSIS — I10 Essential (primary) hypertension: Secondary | ICD-10-CM | POA: Diagnosis not present

## 2024-01-31 DIAGNOSIS — R5383 Other fatigue: Secondary | ICD-10-CM

## 2024-01-31 NOTE — Patient Instructions (Addendum)
 Medication Instructions:    Your physician recommends that you continue on your current medications as directed. Please refer to the Current Medication list given to you today.  *If you need a refill on your cardiac medications before your next appointment, please call your pharmacy*  Lab Work: NONE ORDERED  TODAY    If you have labs (blood work) drawn today and your tests are completely normal, you will receive your results only by: MyChart Message (if you have MyChart) OR A paper copy in the mail If you have any lab test that is abnormal or we need to change your treatment, we will call you to review the results.   Testing/Procedures:  Your physician has requested that you have an echocardiogram. Echocardiography is a painless test that uses sound waves to create images of your heart. It provides your doctor with information about the size and shape of your heart and how well your heart's chambers and valves are working. This procedure takes approximately one hour. There are no restrictions for this procedure. Please do NOT wear cologne, perfume, aftershave, or lotions (deodorant is allowed). Please arrive 15 minutes prior to your appointment time.  Please note: We ask at that you not bring children with you during ultrasound (echo/ vascular) testing. Due to room size and safety concerns, children are not allowed in the ultrasound rooms during exams. Our front office staff cannot provide observation of children in our lobby area while testing is being conducted. An adult accompanying a patient to their appointment will only be allowed in the ultrasound room at the discretion of the ultrasound technician under special circumstances. We apologize for any inconvenience.   Your physician has recommended that you wear an event monitor. Event monitors are medical devices that record the heart's electrical activity. Doctors most often us  these monitors to diagnose arrhythmias. Arrhythmias are  problems with the speed or rhythm of the heartbeat. The monitor is a small, portable device. You can wear one while you do your normal daily activities. This is usually used to diagnose what is causing palpitations/syncope (passing out).    Follow-Up: At [facility], you and your health needs are our priority.  As part of our continuing mission to provide you with exceptional heart care, our providers are all part of one team.  This team includes your primary Cardiologist (physician) and Advanced Practice Providers or APPs (Physician Assistants and Nurse Practitioners) who all work together to provide you with the care you need, when you need it.  Your next appointment:   YOU 'VE BEEN REFFERED TO LIPID CLINIC FOR  HYPERLIPIDEMIA   4 month(s)  Provider:   Wilbert Bihari, MD     We recommend signing up for the patient portal called MyChart.  Sign up information is provided on this After Visit Summary.  MyChart is used to connect with patients for Virtual Visits (Telemedicine).  Patients are able to view lab/test results, encounter notes, upcoming appointments, etc.  Non-urgent messages can be sent to your provider as well.   To learn more about what you can do with MyChart, go to [email or web address].   Other Instructions     ZIO XT- Long Term Monitor Instructions  Your physician has requested you wear a ZIO patch monitor for  7  days.  This is a single patch monitor. [employer] supplies one patch monitor per enrollment. Additional stickers are not available. Please do not apply patch if you will be having a Nuclear Stress Test,  Echocardiogram, Cardiac  CT, MRI, or Chest Xray during the period you would be wearing the  monitor. The patch cannot be worn during these tests. You cannot remove and re-apply the  ZIO XT patch monitor.  Your ZIO patch monitor will be mailed 3 day USPS to your address on file. It may take 3-5 days  to receive your monitor after you have been enrolled.   Once you have received your monitor, please review the enclosed instructions. Your monitor  has already been registered assigning a specific monitor serial # to you.  Billing and Patient Assistance Program Information  We have supplied [employer] with any of your insurance information on file for billing purposes. [employer] offers a sliding scale Patient Assistance Program for patients that do not have  insurance, or whose insurance does not completely cover the cost of the ZIO monitor.  You must apply for the Patient Assistance Program to qualify for this discounted rate.  To apply, please call [employer] at [phone], select option 4, select option 2, ask to apply for  Patient Assistance Program. Meredeth will ask your household income, and how many people  are in your household. They will quote your out-of-pocket cost based on that information.  [employer] will also be able to set up a 7-month, interest-free payment plan if needed.  Applying the monitor   Shave hair from upper left chest.  Hold abrader disc by orange tab. Rub abrader in 40 strokes over the upper left chest as  indicated in your monitor instructions.  Clean area with 4 enclosed alcohol pads. Let dry.  Apply patch as indicated in monitor instructions. Patch will be placed under collarbone on left  side of chest with arrow pointing upward.  Rub patch adhesive wings for 2 minutes. Remove white label marked 1. Remove the white  label marked 2. Rub patch adhesive wings for 2 additional minutes.  While looking in a mirror, press and release button in center of patch. A small green light will  flash 3-4 times. This will be your only indicator that the monitor has been turned on.  Do not shower for the first 24 hours. You may shower after the first 24 hours.  Press the button if you feel a symptom. You will hear a small click. Record Date, Time and  Symptom in the Patient Logbook.  When you are ready to remove the patch, follow  instructions on the last 2 pages of Patient  Logbook. Stick patch monitor onto the last page of Patient Logbook.  Place Patient Logbook in the blue and white box. Use locking tab on box and tape box closed  securely. The blue and white box has prepaid postage on it. Please place it in the mailbox as  soon as possible. Your physician should have your test results approximately 7 days after the  monitor has been mailed back to [facility]  Call [facility] Customer Care at [phone] if you have questions regarding  your ZIO XT patch monitor. Call them immediately if you see an orange light blinking on your  monitor.  If your monitor falls off in less than 4 days, contact our Monitor department at [phone].  If your monitor becomes loose or falls off after 4 days call [employer] at [phone] for  suggestions on securing your monitor

## 2024-01-31 NOTE — Progress Notes (Unsigned)
Enrolled patient for a 7 day Zio XT monitor to be mailed to patients home  Turner to read

## 2024-02-07 DIAGNOSIS — H547 Unspecified visual loss: Secondary | ICD-10-CM | POA: Diagnosis not present

## 2024-02-07 DIAGNOSIS — R4189 Other symptoms and signs involving cognitive functions and awareness: Secondary | ICD-10-CM | POA: Diagnosis not present

## 2024-02-12 ENCOUNTER — Encounter: Payer: Self-pay | Admitting: Radiology

## 2024-02-28 ENCOUNTER — Ambulatory Visit: Admitting: Podiatry

## 2024-02-28 ENCOUNTER — Encounter: Payer: Self-pay | Admitting: Podiatry

## 2024-02-28 DIAGNOSIS — M79674 Pain in right toe(s): Secondary | ICD-10-CM

## 2024-02-28 DIAGNOSIS — B351 Tinea unguium: Secondary | ICD-10-CM | POA: Diagnosis not present

## 2024-02-28 DIAGNOSIS — E1140 Type 2 diabetes mellitus with diabetic neuropathy, unspecified: Secondary | ICD-10-CM

## 2024-02-28 DIAGNOSIS — S90221D Contusion of right lesser toe(s) with damage to nail, subsequent encounter: Secondary | ICD-10-CM

## 2024-02-28 DIAGNOSIS — M79675 Pain in left toe(s): Secondary | ICD-10-CM

## 2024-02-29 ENCOUNTER — Other Ambulatory Visit: Payer: Self-pay | Admitting: Cardiology

## 2024-03-01 DIAGNOSIS — R9082 White matter disease, unspecified: Secondary | ICD-10-CM | POA: Diagnosis not present

## 2024-03-01 DIAGNOSIS — I6782 Cerebral ischemia: Secondary | ICD-10-CM | POA: Diagnosis not present

## 2024-03-05 ENCOUNTER — Other Ambulatory Visit (HOSPITAL_COMMUNITY): Payer: Self-pay

## 2024-03-05 ENCOUNTER — Telehealth: Payer: Self-pay

## 2024-03-05 ENCOUNTER — Ambulatory Visit

## 2024-03-05 ENCOUNTER — Telehealth: Payer: Self-pay | Admitting: Pharmacy Technician

## 2024-03-05 DIAGNOSIS — E782 Mixed hyperlipidemia: Secondary | ICD-10-CM

## 2024-03-05 NOTE — Assessment & Plan Note (Signed)
 Assessment:  LDL goal: < 70 mg/dl; last LDLc 897 mg/dl (0/7974) Tolerates pravastatin  well without any side effects  Hx of liver enzyme elevation on simvastatin  and rosuvastatin  in the past Discussed next potential options (PCSK-9 inhibitors, bempedoic acid and inclisiran); cost, dosing efficacy, side effects  Discussed and provided Planning Healthy Meals handout and emphasized importance of exercise as tolerated  Plan: Continue taking pravastatin  40 mg daily Will apply for PA for PCSK9i; will inform patient upon approval  Lipid lab due in 3 months after starting PCSK9i

## 2024-03-05 NOTE — Patient Instructions (Signed)
 Your Results:             Your most recent labs Goal  Total Cholesterol 205 < 200  Triglycerides 265 < 150  HDL (happy/good cholesterol) 58 > 40  LDL (lousy/bad cholesterol 102 < 70   Medication changes: Continue pravastatin  40 mg daily  We will start the process to get PCSK9i covered by your insurance.  Once the prior authorization is complete, we will call you to let you know and confirm pharmacy information.     Praluent is a cholesterol medication that improved your body's ability to get rid of bad cholesterol known as LDL. It can lower your LDL up to 60%. It is an injection that is given under the skin every 2 weeks. The most common side effects of Praluent include runny nose, symptoms of the common cold, rarely flu or flu-like symptoms, back/muscle pain in about 3-4% of the patients, and redness, pain, or bruising at the injection site.    Repatha is a cholesterol medication that improved your body's ability to get rid of bad cholesterol known as LDL. It can lower your LDL up to 60%! It is an injection that is given under the skin every 2 weeks. The medication often requires a prior authorization from your insurance company. We will take care of submitting all the necessary information to your insurance company to get it approved. The most common side effects of Repatha include runny nose, symptoms of the common cold, rarely flu or flu-like symptoms, back/muscle pain in about 3-4% of the patients, and redness, pain, or bruising at the injection site.  Copay Assistance:  The [employer] Well foundation offers assistance to help pay for medication copays.  They will cover copays for all cholesterol lowering meds, including statins, fibrates, omega-3 oils, ezetimibe, Repatha, Praluent, Nexletol, Nexlizet.  The cards are usually good for $2,500 or 12 months, whichever comes first. Go to healthwellfoundation.org Click on "Apply Now" Answer questions as to whom is applying (patient or  representative) Your disease fund will be "hypercholesterolemia - Medicare access" Select the cholesterol medication you need assistance with (Repatha, Praluent, Nexlizet...) They will ask question about qualifying diagnosis - you can mark yes; and do you have insurance coverage.   When they ask what type of assistance you are interested in - copay assistance When you submit, the approval is usually within minutes.  You will need to print the card information from the site You will need to show this information to your pharmacy, they will bill your Medicare Part D plan first -then bill [employer] Well --for the copay.   You can also call them at [phone], although the hold times can be quite long.

## 2024-03-05 NOTE — Telephone Encounter (Signed)
 Pharmacy Patient Advocate Encounter   Received notification from Physician's Office that prior authorization for repatha is required/requested.   Insurance verification completed.   The patient is insured through [place].   Per test claim: PA required; PA submitted to above mentioned insurance via Latent Key/confirmation #/EOC [id number] Status is pending

## 2024-03-05 NOTE — Progress Notes (Signed)
 Patient ID: Joshua Vargas                 DOB: November 03, 1945                    MRN: [id number]      HPI: Rahkim Rabalais Schroeck is a 78 y.o. male patient referred to lipid clinic by Miriam Shams, NP. PMH is significant for HLD, HTN, T2DM, CAD (70% D1, 25% proximal LAD), and OSA.  Patient presents today accompanied by wife for PharmD visit. Patient was last seen by cardiology provider in October 2025 for annual follow-up of CAD. Recent lipid panel revealed elevated LDL (102) and triglycerides (265). He had previously discontinued simvastatin  and rosuvastatin  due to elevated liver enzymes in the past but is currently managed on pravastain 40 mg, which he tolerates well without any side effects; recent liver enzymes WNL. Despite therapy on moderate intensity statin, LDL remains above the goal of < 70 mg/dL, and a referral was placed to the PharmD lipid clinic.   The patient and his wife report that his appetite has decreased compared to the past. However, when he does eat, his meals are often high in carbohydrates. We discussed ideal eating habits given his history of diabetes and emphasized the need for increased vegetable intake to support both lipid and glycemic control. The patient mentioned that his most recent A1c was greater than 7% and admitted to not taking his diabetes medications as prescribed, including metformin , which he only takes once daily. He was previously on Trulicity  but discontinued due to cost. The patient expressed interest in contacting endocrinology to explore alternative GLP-1 options covered by his formulary. He was encouraged to take medications as prescribed, and we reviewed appropriate timing for each medication throughout the day.  Reviewed options for lowering LDL cholesterol, including ezetimibe, PCSK-9 inhibitors, bempedoic acid and inclisiran.  Discussed mechanisms of action, dosing, side effects and potential decreases in LDL cholesterol.  Also reviewed cost information and  potential options for patient assistance.   Current Medications: pravastatin  40 mg daily  Intolerances: simvastatin  and rosuvastatin  (hx of elevated liver enzymes in 2015) Risk Factors: HTN, T2DM, CAD (70% D1, 25% proximal LAD), family hx of premature ASCVD event  LDL goal: <70 Lipid panel (12/2023): Chol 205, HDL 58, Trig 265, LDL 102 Liver enzymes (12/2023): ALT 33, AST 38, Alk phos 52   Diet:  Breakfast: cereal, black coffee, occasionally biscuits with strawberry jelly Lunch/Dinner: typically skips lunch; baked cubed steak, occasionally pork chop, vegetables occasionally (cabbage, collard greens) Snacks: sherbet ice cream, graham crackers Beverages: strawberry soda  Exercise: none   Family History:  Relation Problem Comments  Mother (Deceased)   Father (Deceased) Cancer     Brother (Deceased) Diabetes   Heart attack (Age: 79)     Maternal Grandmother (Deceased)   Maternal Grandfather (Deceased)   Paternal Grandmother (Deceased)   Paternal Grandfather (Deceased)   Neg Hx Allergic rhinitis   Angioedema   Asthma   Atopy   Eczema   Immunodeficiency   Urticaria      Social History:  Alcohol: none Smoking: none  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 pain     albuterol  (VENTOLIN  HFA) 108 (90 Base) MCG/ACT inhaler Inhale 1-2  puffs into the lungs every 6 (six) hours as needed for wheezing or shortness of breath. 1 each 0   amLODipine  (NORVASC ) 10 MG tablet TAKE 1 TABLET EVERY DAY (NEED MD APPOINTMENT) 90 tablet 3   aspirin  81 MG EC tablet Take 81 mg by mouth daily.     augmented betamethasone dipropionate (DIPROLENE-AF) 0.05 % cream APPLY SPARINGLY TO AFFECTED AREA TWICE A DAY     Blood Glucose Monitoring Suppl (ACCU-CHEK GUIDE) w/Device KIT 1 Device by Does not apply route daily. 1 kit 0   Blood Glucose Monitoring Suppl DEVI 1 each by Does not apply route in the morning, at noon, and at bedtime. May substitute to any manufacturer covered by patient's insurance. 1 each 0   carvedilol  (COREG ) 12.5 MG tablet Take 1 tablet (12.5 mg total) by mouth 2 (two) times daily. 180 tablet 2   chlorthalidone  (HYGROTON ) 25 MG tablet TAKE 1 TABLET EVERY DAY 90 tablet 3   Cholecalciferol (VITAMIN D3) 10 MCG (400 UNIT) CAPS SMARTSIG:1 Capsule(s) By Mouth     ciclopirox (PENLAC) 8 % solution SMARTSIG:1 sparingly Topical Every Night     GEMTESA 75 MG TABS Take 1 tablet by mouth daily.     glimepiride  (AMARYL ) 2 MG tablet Take 2 tablets (4 mg total) by mouth daily. 180 tablet 3   glucose blood (ACCU-CHEK GUIDE) test strip Use as instructed 100 each 12   Glucose Blood (BLOOD GLUCOSE TEST STRIPS) STRP 1 each by In Vitro route daily. May substitute to any manufacturer covered by patient's insurance. 100 each 3   irbesartan  (AVAPRO ) 300 MG tablet TAKE 1 TABLET EVERY DAY 30 tablet 0   Lancets Misc. MISC 1 each by Does not apply route daily. May substitute to any manufacturer covered by patient's insurance. 100 each 3   latanoprost  (XALATAN ) 0.005 % ophthalmic solution SMARTSIG:In Eye(s)     metFORMIN  (GLUCOPHAGE ) 1000 MG tablet Take 1 tablet (1,000 mg total) by mouth 2 (two) times daily with a meal. 180 tablet 3   methylPREDNISolone  (MEDROL  DOSEPAK) 4 MG TBPK tablet See pack instructions 21 each 0   oxyCODONE  (ROXICODONE ) 5 MG immediate release  tablet Take 0.5 tablets (2.5 mg total) by mouth every 6 (six) hours as needed for severe pain (pain score 7-10) or breakthrough pain. 12 tablet 0   pioglitazone  (ACTOS ) 30 MG tablet Take 1 tablet (30 mg total) by mouth daily. 90 tablet 3   potassium chloride  (KLOR-CON  M) 10 MEQ tablet TAKE 1 TABLET EVERY DAY 90 tablet 0   pravastatin  (PRAVACHOL ) 40 MG tablet Take 1 tablet (40 mg total) by mouth daily. 90 tablet 3   tadalafil  (CIALIS ) 5 MG tablet TAKE 1 TABLET EVERY DAY AS NEEDED FOR ERECTILE DYSFUNCTION 30 tablet 0   tamsulosin (FLOMAX) 0.4 MG CAPS capsule Take 0.4 mg by mouth daily.     terbinafine (LAMISIL) 250 MG tablet SMARTSIG:1 Tablet(s) By Mouth     No  current facility-administered medications on file prior to visit.    Allergies  Allergen Reactions   Atorvastatin Itching    Other reaction(s): Other, Unknown Other reaction(s): increased LFT's   Dextran Rash    Other Reaction: Not Assessed Other reaction(s): Other Other Reaction: Not Assessed Other Reaction: Not Assessed Other Reaction: Not Assessed    Assessment/Plan:  1. Hyperlipidemia -  Problem  Mixed Hyperlipidemia   Qualifier: Diagnosis of  By: Cherrie, MD, CODY Toribio SAUNDERS     Mixed hyperlipidemia Assessment:  LDL goal: < 70 mg/dl; last LDLc 897 mg/dl (0/7974) Tolerates pravastatin  well without any side effects  Hx of liver enzyme elevation on simvastatin  and rosuvastatin  in the past Discussed next potential options (PCSK-9 inhibitors, bempedoic acid and inclisiran); cost, dosing efficacy, side effects  Discussed and provided Planning Healthy Meals handout and emphasized importance of exercise as tolerated  Plan: Continue taking pravastatin  40 mg daily Will apply for PA for PCSK9i; will inform patient upon approval  Lipid lab due in 3 months after starting PCSK9i   Thank you,  Jamisen Duerson E. Trew Sunde, Pharm.D [facility] [address], [place], [place]  [address] Phone: [phone]; Fax: [phone]

## 2024-03-05 NOTE — Telephone Encounter (Signed)
 Pharmacy Patient Advocate Encounter  Received notification from [employer] that Prior Authorization for Repatha has been APPROVED from 04/12/23 to 04/10/25. Ran test claim, Copay is $289.41- one month. This test claim was processed through [facility]- copay amounts may vary at other pharmacies due to pharmacy/plan contracts, or as the patient moves through the different stages of their insurance plan.   PA #/Case ID/Reference #: [id number]

## 2024-03-06 ENCOUNTER — Other Ambulatory Visit (HOSPITAL_COMMUNITY): Payer: Self-pay

## 2024-03-06 NOTE — Telephone Encounter (Signed)
 I was able to get the patient added into the [employer] portal, however the grant has been flagged for diagnosis verification. I will submit the form into the portal today, but please allow a few business days for the [facility] team to review and release the funds. I will reach back out to you once they notify me that the funds are clear for patient to use.

## 2024-03-06 NOTE — Telephone Encounter (Addendum)
 Called patient's wife as requested to discuss Repatha copay. NR-LVM requesting call back.

## 2024-03-06 NOTE — Telephone Encounter (Signed)
 Please see other encounter.

## 2024-03-08 NOTE — Progress Notes (Signed)
  Subjective:  Patient ID: Joshua Vargas, male    DOB: May 08, 1945,  MRN: [id number]  Joshua Vargas presents to clinic today for preventative diabetic foot care for painful mycotic toenails of both feet that are difficult to trim. Pain interferes with daily activities and wearing enclosed shoe gear comfortably.  Chief Complaint  Patient presents with   Diabetes    DFC NIDDM A1C 7.6. Toenail trim. LOV with PCP 01/05/24.   New problem(s): None.   PCP is Seabron Lenis, MD.  Allergies  Allergen Reactions   Atorvastatin Itching    Other reaction(s): Other, Unknown Other reaction(s): increased LFT's   Dextran Rash    Other Reaction: Not Assessed Other reaction(s): Other Other Reaction: Not Assessed Other Reaction: Not Assessed Other Reaction: Not Assessed    Review of Systems: Negative except as noted in the HPI.  Objective: No changes noted in today's physical examination. There were no vitals filed for this visit. Joshua Vargas is a pleasant 78 y.o. male WD, WN in NAD. AAO x 3.  Vascular Examination: Capillary refill time immediate b/l. Palpable pedal pulses. Pedal hair present b/l. Pedal edema absent. No pain with calf compression b/l. Skin temperature gradient WNL b/l. No cyanosis or clubbing b/l. No ischemia or gangrene noted b/l.   Neurological Examination: Pt has subjective symptoms of neuropathy. Protective sensation intact 5/5 intact bilaterally with 10g monofilament b/l.  Dermatological Examination: Subungual seroma noted right great toe.  Pedal skin with normal turgor, texture and tone b/l. SABRA No interdigital macerations.   Toenails 1-5 b/l thick, discolored, elongated with subungual debris and pain on dorsal palpation.   No corns, calluses, nor porokeratotic lesions.  Musculoskeletal Examination: Muscle strength 5/5 to all lower extremity muscle groups bilaterally. No gross bony deformities bilaterally.  Radiographs: None  Assessment/Plan: 1. Pain due  to onychomycosis of toenails of both feet   2. Subungual contusion of toe of right foot, subsequent encounter   3. Type 2 diabetes mellitus with diabetic neuropathy, unspecified whether long term insulin  use (HCC)   -Consent given for treatment as described below: -Examined patient. -Patient to continue soft, supportive shoe gear daily. -Toenails were debrided in length and girth 2-5 bilaterally and left great toe with sterile nail nippers and dremel without iatrogenic bleeding.  -Loose nailplate right great toe gently debrided to level of adherence. Digit cleansed with alcohol. Betadine Solution applied to nailbed followed by light dressing. No further treatment required by patient. -Patient/POA to call should there be question/concern in the interim.   Return in about 9 weeks (around 05/01/2024).  [name], DPM      [place] LOCATION: [address] [place] [address]                   Office [phone]   [facility] LOCATION: [address] [place], [place] [address] Office [phone]

## 2024-03-13 NOTE — Telephone Encounter (Signed)
 Sounds good! As of today it's still being reviewed/pending.   [employer] used to only flag some grants but due to some fraud issues they are now requiring this for ever grant submitted so it seems like it's created maybe more work for them than they anticipated. I think other teammates are having delays in grant approvals for the same reason. We will keep you updated on status. Feel free to check back anytime for another update.

## 2024-03-21 MED ORDER — REPATHA SURECLICK 140 MG/ML ~~LOC~~ SOAJ: 140.0000 mg | SUBCUTANEOUS | 1 refills | Status: AC

## 2024-03-21 NOTE — Telephone Encounter (Signed)
 Shared pharmacy benefit information with patient to present to pharmacy in order to get Repatha at 0$. Rx sent.

## 2024-03-21 NOTE — Telephone Encounter (Signed)
 Hi there! It's passed now! Patient should have access to the funds.   The patient was approved for a [employer] grant that will help cover the cost of repatha Total amount awarded, $2,500.  Effective: 02/05/24 - 02/03/25  APW:[id number] ERW:[id number] Hmnle:[id number] PI:[id number]  Ileana Lehmann, CPhT  Pharmacy Patient Advocate Specialist  Direct Number: [phone] Fax: [phone]

## 2024-03-21 NOTE — Addendum Note (Signed)
 Addended by: Denina Rieger E on: 03/21/2024 01:09 PM   Modules accepted: Orders

## 2024-03-25 ENCOUNTER — Ambulatory Visit (HOSPITAL_COMMUNITY): Admission: RE | Admit: 2024-03-25 | Discharge: 2024-03-25 | Attending: Emergency Medicine

## 2024-03-25 DIAGNOSIS — I2510 Atherosclerotic heart disease of native coronary artery without angina pectoris: Secondary | ICD-10-CM

## 2024-03-25 LAB — ECHOCARDIOGRAM COMPLETE
Area-P 1/2: 2.27 cm2
S' Lateral: 3.3 cm

## 2024-03-26 ENCOUNTER — Telehealth: Payer: Self-pay | Admitting: *Deleted

## 2024-03-26 ENCOUNTER — Ambulatory Visit: Payer: Self-pay | Admitting: Emergency Medicine

## 2024-03-26 DIAGNOSIS — I2510 Atherosclerotic heart disease of native coronary artery without angina pectoris: Secondary | ICD-10-CM

## 2024-03-26 DIAGNOSIS — R5383 Other fatigue: Secondary | ICD-10-CM

## 2024-03-26 DIAGNOSIS — G4733 Obstructive sleep apnea (adult) (pediatric): Secondary | ICD-10-CM

## 2024-03-26 DIAGNOSIS — R002 Palpitations: Secondary | ICD-10-CM

## 2024-03-26 DIAGNOSIS — I10 Essential (primary) hypertension: Secondary | ICD-10-CM

## 2024-03-26 DIAGNOSIS — I38 Endocarditis, valve unspecified: Secondary | ICD-10-CM

## 2024-03-26 NOTE — Telephone Encounter (Signed)
-----   Message from Wilbert Bihari, MD sent at 03/25/2024  6:37 PM EST ----- Brad please order a split night sleep study on this patient ----- Message ----- From: Campbell, Kenzie E, NP Sent: 03/25/2024   1:18 PM EST To: Wilbert JONELLE Bihari, MD  Hey Dr. Bihari,   This is a patient of yours who I saw a couple weeks ago. He was interested in further work up for sleep apnea. Which modality of study do you believe would be the most beneficial for him going forward? I can get the order placed in hopes it will be completed by the time he follows up with you in February.   Thanks for your time, Kenzie

## 2024-03-28 ENCOUNTER — Encounter: Payer: Self-pay | Admitting: Endocrinology

## 2024-03-28 ENCOUNTER — Ambulatory Visit: Payer: Self-pay | Admitting: Endocrinology

## 2024-03-28 ENCOUNTER — Ambulatory Visit: Admitting: Endocrinology

## 2024-03-28 VITALS — BP 120/60 | Ht 70.0 in | Wt 208.0 lb

## 2024-03-28 DIAGNOSIS — Z7984 Long term (current) use of oral hypoglycemic drugs: Secondary | ICD-10-CM | POA: Diagnosis not present

## 2024-03-28 DIAGNOSIS — D352 Benign neoplasm of pituitary gland: Secondary | ICD-10-CM | POA: Diagnosis not present

## 2024-03-28 DIAGNOSIS — Z794 Long term (current) use of insulin: Secondary | ICD-10-CM | POA: Diagnosis not present

## 2024-03-28 DIAGNOSIS — E1165 Type 2 diabetes mellitus with hyperglycemia: Secondary | ICD-10-CM

## 2024-03-28 DIAGNOSIS — E782 Mixed hyperlipidemia: Secondary | ICD-10-CM | POA: Diagnosis not present

## 2024-03-28 LAB — POCT GLYCOSYLATED HEMOGLOBIN (HGB A1C): Hemoglobin A1C: 7.6 % — AB (ref 4.0–5.6)

## 2024-03-28 MED ORDER — GLIMEPIRIDE 2 MG PO TABS: 2.0000 mg | ORAL_TABLET | Freq: Every day | ORAL | 3 refills | Status: AC

## 2024-03-28 MED ORDER — TRULICITY 1.5 MG/0.5ML ~~LOC~~ SOAJ: 1.5000 mg | SUBCUTANEOUS | 4 refills | Status: AC

## 2024-03-28 NOTE — Progress Notes (Signed)
 Outpatient Endocrinology Note Errika Narvaiz, MD   Patient's Name: Joshua Vargas    DOB: 01-09-1946    MRN: [id number]                                                    REASON OF VISIT: Follow up for type 2 diabetes mellitus  PCP: Seabron Lenis, MD  HISTORY OF PRESENT ILLNESS:   Joshua Vargas is a 77 y.o. old male with past medical history listed below, is here for follow up for type 2 diabetes mellitus.   Pertinent Diabetes History: Patient was previously seen by Dr. Von and was last time seen in February 2024.  Patient was diagnosed with type 2 diabetes mellitus in 2002.  Patient has uncontrolled type 2 diabetes mellitus.  No personal history of pancreatitis and / or family history of medullary thyroid  carcinoma or MEN 2B syndrome.   Chronic Diabetes Complications : Retinopathy: no. Last ophthalmology exam was done on 10/2022, annually, following with ophthalmology regularly.  Nephropathy: ? CKD, on ACE/ARB / irbesartan  Peripheral neuropathy: mild yes, tingling  Coronary artery disease: yes Stroke: no  Relevant comorbidities and cardiovascular risk factors: Obesity: no Body mass index is 29.84 kg/Vargas.  Hypertension: Yes  Hyperlipidemia : Yes, on statin  Lipids: He had been on Crestor  and simvastatin  and these were stopped because of increased liver functions Because of significantly high lipids he has been on 40 mg pravastatin   LDL has been well controlled.  Current / Home Diabetic regimen includes:  Metformin  1000 mg 2 times a day. Amaryl  2 mg 2 times a day. Actos  30 mg daily. Trulicity  0.75 mg weekly.  Prior diabetic medications: Trulicity  up to 1.5 mg weekly in the past, stopped due to not covered by insurance at that time, due to donut hole.  He used to be on Victoza  switched to Trulicity .  Nausea with Victoza  1.2 mg in the past.  Trulicity  and Jardiance  not cost effective.  Glycemic data:   He has not been checking blood sugar at  home.  Hypoglycemia: Patient has no hypoglycemic episodes. Patient has hypoglycemia awareness.  Factors modifying glucose control: 1.  Diabetic diet assessment: 3 meals a day.  2.  Staying active or exercising:   3.  Medication compliance: compliant most of the time.  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 He has been taken off his medication, previously because of his noncompliance with this prolactin consistently normal even without cabergoline .  He had taken cabergoline  onto the beginning of 2022.  Testosterone  level has been normal with normalization of prolactin  Interval history  Hemoglobin A1c 7.6% today.  Patient reports he started Trulicity  2 weeks ago taking 0.75 mg weekly.  He has not been checking blood sugar.  He reports rare hypoglycemic symptoms.  Diabetes regimen as reviewed and noted above.  He has been regularly following with podiatry, complains of mild numbness and tingling of the feet.  No other complaints today.  REVIEW OF SYSTEMS As per history of present illness.   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   History of colonic polyps    Hyperlipemia    Hypertension    Knee pain    Memory loss    Paresthesia    Pneumonia 1990s X 1; ~ 2000 X 1   Prolactinoma (HCC)    Type II diabetes mellitus (HCC)    Vitreous floaters of right eye     PAST SURGICAL HISTORY: Past Surgical History:  Procedure Laterality Date   CARDIAC CATHETERIZATION  ?2006; 01/25/2018   ELBOW SURGERY Left    something to do w/the muscle   JOINT REPLACEMENT     LEFT HEART CATH AND CORONARY ANGIOGRAPHY N/A 01/26/2018    Procedure: LEFT HEART CATH AND CORONARY ANGIOGRAPHY;  Nienhaus: Jordan, Peter M, MD;  Location: [facility];  Service: Cardiovascular;  Laterality: N/A;   PICC LINE INSERTION  ~ 2005   took it out months later   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   TOTAL KNEE ARTHROPLASTY Left 2005   TRIGGER FINGER RELEASE Bilateral ~ 2016    ALLERGIES: Allergies  Allergen Reactions   Atorvastatin Itching    Other reaction(s): Other, Unknown Other reaction(s): increased LFT's   Dextran Rash    Other Reaction: Not Assessed Other reaction(s): Other Other Reaction: Not Assessed Other Reaction: Not Assessed Other Reaction: Not Assessed    FAMILY HISTORY:  Family History  Problem Relation Age of Onset   Cancer Father    Diabetes Brother    Heart attack Brother 44   Allergic rhinitis Neg Hx    Angioedema Neg Hx    Asthma Neg Hx    Atopy Neg Hx    Eczema Neg Hx    Immunodeficiency Neg Hx    Urticaria Neg Hx     SOCIAL HISTORY: Social History   Socioeconomic History   Marital status: Married    Spouse name: Not on file   Number of children: Not on file   Years of education: Not on file   Highest education level: Not on file  Occupational History   Not on file  Tobacco Use   Smoking status: Never   Smokeless tobacco: Never  Vaping Use   Vaping status: Never Used  Substance and Sexual Activity   Alcohol use: Never   Drug use: Never   Sexual activity: Yes  Other Topics Concern   Not on file  Social History Narrative   Not on file   Social Drivers of Health   Tobacco Use: Low Risk (03/28/2024)   Patient History    Smoking Tobacco Use: Never    Smokeless Tobacco Use: Never    Passive Exposure: Not on file  Financial Resource Strain: Low Risk (02/15/2022)   Received from [facility]   Overall Financial Resource Strain (CARDIA)    Difficulty of Paying Living Expenses: Not very hard  Food Insecurity: No Food Insecurity (02/15/2022)   [facility]   Epic    Within the past 12 months, you worried that your food would run out before you got the money to buy more.: Never true    Within the past 12 months, the food you bought just didn't last and you didn't have money to get more.: Never true  Transportation Needs: Not on file  Physical Activity: Sufficiently Active (02/15/2022)   Received [facility]   Exercise Vital Sign  On average, how many days per week do you engage in moderate to strenuous exercise (like a brisk walk)?: 4 days    On average, how many minutes do you engage in exercise at this level?: 60 min  Stress: No Stress Concern Present (02/15/2022)   Received from [facility] - Occupational Stress Questionnaire    Feeling of Stress : Not at all  Social Connections: Socially Integrated (02/15/2022)   Received from [facility]   Social Network    How would you rate your social network (family, work, friends)?: Good participation with social networks  Depression (PHQ2-9): Not on file  Alcohol Screen: Not on file  Housing: Not on file  Utilities: Not on file  Health Literacy: Not on file    MEDICATIONS:  Current Outpatient Medications  Medication Sig Dispense Refill   acetaminophen  (TYLENOL ) 500 MG tablet Take 500 mg by mouth as needed. pain     albuterol  (VENTOLIN  HFA) 108 (90 Base) MCG/ACT inhaler Inhale 1-2 puffs into the lungs every 6 (six) hours as needed for wheezing or shortness of breath. 1 each 0   amLODipine  (NORVASC ) 10 MG tablet TAKE 1 TABLET EVERY DAY (NEED MD APPOINTMENT) 90 tablet 3   aspirin  81 MG EC tablet Take 81 mg by mouth daily.     augmented betamethasone dipropionate (DIPROLENE-AF) 0.05 % cream APPLY SPARINGLY TO AFFECTED AREA TWICE A DAY     Blood Glucose Monitoring Suppl (ACCU-CHEK GUIDE) w/Device KIT 1 Device by Does not apply route daily. 1 kit 0   Blood Glucose Monitoring Suppl DEVI 1 each by Does not apply route in the  morning, at noon, and at bedtime. May substitute to any manufacturer covered by patient's insurance. 1 each 0   carvedilol  (COREG ) 12.5 MG tablet Take 1 tablet (12.5 mg total) by mouth 2 (two) times daily. 180 tablet 2   chlorthalidone  (HYGROTON ) 25 MG tablet TAKE 1 TABLET EVERY DAY 90 tablet 3   Cholecalciferol (VITAMIN D3) 10 MCG (400 UNIT) CAPS SMARTSIG:1 Capsule(s) By Mouth     ciclopirox (PENLAC) 8 % solution SMARTSIG:1 sparingly Topical Every Night     Dulaglutide  (TRULICITY ) 1.5 MG/0.5ML SOAJ Inject 1.5 mg into the skin once a week. 6 mL 4   Evolocumab  (REPATHA  SURECLICK) 140 MG/ML SOAJ Inject 140 mg into the skin every 14 (fourteen) days. 6 mL 1   GEMTESA 75 MG TABS Take 1 tablet by mouth daily.     glucose blood (ACCU-CHEK GUIDE) test strip Use as instructed 100 each 12   Glucose Blood (BLOOD GLUCOSE TEST STRIPS) STRP 1 each by In Vitro route daily. May substitute to any manufacturer covered by patient's insurance. 100 each 3   irbesartan  (AVAPRO ) 300 MG tablet TAKE 1 TABLET EVERY DAY 30 tablet 0   Lancets Misc. MISC 1 each by Does not apply route daily. May substitute to any manufacturer covered by patient's insurance. 100 each 3   latanoprost  (XALATAN ) 0.005 % ophthalmic solution SMARTSIG:In Eye(s)     metFORMIN  (GLUCOPHAGE ) 1000 MG tablet Take 1 tablet (1,000 mg total) by mouth 2 (two) times daily with a meal. 180 tablet 3   methylPREDNISolone  (MEDROL  DOSEPAK) 4 MG TBPK tablet See pack instructions 21 each 0   oxyCODONE  (ROXICODONE ) 5 MG immediate release tablet Take 0.5 tablets (2.5 mg total) by mouth every 6 (six) hours as needed for severe pain (pain score 7-10) or breakthrough pain. 12 tablet 0   pioglitazone  (ACTOS ) 30 MG  tablet Take 1 tablet (30 mg total) by mouth daily. 90 tablet 3   potassium chloride  (KLOR-CON  Vargas) 10 MEQ tablet TAKE 1 TABLET EVERY DAY 90 tablet 0   pravastatin  (PRAVACHOL ) 40 MG tablet Take 1 tablet (40 mg total) by mouth daily. 90 tablet 3   tadalafil  (CIALIS )  5 MG tablet TAKE 1 TABLET EVERY DAY AS NEEDED FOR ERECTILE DYSFUNCTION 30 tablet 0   tamsulosin (FLOMAX) 0.4 MG CAPS capsule Take 0.4 mg by mouth daily.     terbinafine (LAMISIL) 250 MG tablet SMARTSIG:1 Tablet(s) By Mouth     glimepiride  (AMARYL ) 2 MG tablet Take 1 tablet (2 mg total) by mouth daily. 90 tablet 3   No current facility-administered medications for this visit.    PHYSICAL EXAM: Vitals:   03/28/24 0809  BP: 120/60  Weight: 208 lb (94.3 kg)  Height: 5' 10 (1.778 Vargas)    Body mass index is 29.84 kg/Vargas.  Wt Readings from Last 3 Encounters:  03/28/24 208 lb (94.3 kg)  01/31/24 202 lb (91.6 kg)  12/01/23 200 lb 6.4 oz (90.9 kg)    General: Well developed, well nourished male in no apparent distress.  HEENT: AT/[place], no external lesions.  Eyes: Conjunctiva clear and no icterus. Neck: Neck supple  Lungs: Respirations not labored Neurologic: Alert, oriented, normal speech Extremities / Skin: Dry.  Psychiatric: Does not appear depressed or anxious  Diabetic Foot Exam - Simple   Simple Foot Form Diabetic Foot exam was performed with the following findings: Yes 03/28/2024  8:47 AM  Visual Inspection See comments: Yes Sensation Testing Intact to touch and monofilament testing bilaterally: Yes Pulse Check Posterior Tibialis and Dorsalis pulse intact bilaterally: Yes Comments Dsytrophic nails of great bilateral toes.  Mild hammer toes deformities bilaterally.  No ulcer.    LABS Reviewed Lab Results  Component Value Date   HGBA1C 7.6 (A) 03/28/2024   HGBA1C 7.6 (A) 12/01/2023   HGBA1C 8.8 (H) 08/07/2023   Lab Results  Component Value Date   FRUCTOSAMINE 259 11/28/2017   FRUCTOSAMINE 261 07/13/2016   Lab Results  Component Value Date   CHOL 186 08/07/2023   HDL 58 08/07/2023   LDLCALC 96 08/07/2023   LDLDIRECT 60.0 09/01/2022   TRIG 228 (H) 08/07/2023   CHOLHDL 3.2 08/07/2023   Lab Results  Component Value Date   MICRALBCREAT 33 (H) 08/07/2023   Lab  Results  Component Value Date   CREATININE 1.06 08/31/2023   Lab Results  Component Value Date   GFR 57.66 (L) 09/01/2022    ASSESSMENT / PLAN  1. Uncontrolled type 2 diabetes mellitus with hyperglycemia, without long-term current use of insulin  (HCC)   2. Prolactinoma (HCC)   3. Mixed hyperlipidemia     Diabetes Mellitus type 2, complicated by mild neuropathy, CAD, ?  CKD. - Diabetic status / severity: Uncontrolled.    Lab Results  Component Value Date   HGBA1C 7.6 (A) 03/28/2024    - Hemoglobin A1c goal : <7%  He recently restarted Trulicity .  Currently on 0.75 mg weekly.  Adjusted diabetes regimen as follows.  - Medications:   Continue pioglitazone /Actos  30 mg daily. Decrease glimepiride  from 2 mg twice a day to 2 mg daily.  After increasing dose of Trulicity . Continue metformin  1000 mg 2 times a day.  Better to take with meal. Increase Trulicity  from 0.75 to 1.5 mg weekly after 2 weeks.  Discussed about potential risks of hypoglycemia on taking glimepiride .  - Home glucose testing: At least check  in the morning fasting and at bedtime.  Patient has glucometer and test supplies. - Discussed/ Gave Hypoglycemia treatment plan.  # Consult : not required at this time.   # Annual urine for microalbuminuria/ creatinine ratio, + microalbuminuria currently, continue ACE/ARB /irbesartan . Last  Lab Results  Component Value Date   MICRALBCREAT 33 (H) 08/07/2023    # Foot check nightly / neuropathy.  # Annual dilated diabetic eye exams.   - Diet: Make healthy diabetic food choices - Life style / activity / exercise: Discussed.  2. Blood pressure  -  BP Readings from Last 1 Encounters:  03/28/24 120/60    - Control is in target.  - No change in current plans.  3. Lipid status / Hyperlipidemia - Last  Lab Results  Component Value Date   LDLCALC 96 08/07/2023   - Continue pravastatin  40 mg daily.  # Prolactinoma/hyperprolactinemia -He was diagnosed with  prolactinoma in 2007.  He was on cabergoline  until beginning of 2022.  He was not fully compliant in the past.  His prolactin has remained normal without cabergoline . - Will check prolactin with next set of lab.  Joziah was seen today for diabetes.  Diagnoses and all orders for this visit:  Uncontrolled type 2 diabetes mellitus with hyperglycemia, without long-term current use of insulin  (HCC) -     POCT glycosylated hemoglobin (Hb A1C) -     Dulaglutide  (TRULICITY ) 1.5 MG/0.5ML SOAJ; Inject 1.5 mg into the skin once a week. -     Basic metabolic panel with GFR -     Hemoglobin A1c -     Microalbumin / creatinine urine ratio -     glimepiride  (AMARYL ) 2 MG tablet; Take 1 tablet (2 mg total) by mouth daily.  Prolactinoma (HCC) -     Prolactin  Mixed hyperlipidemia -     Lipid panel    DISPOSITION Follow up in clinic in 4 months suggested.  Labs prior to follow-up visit as ordered.   All questions answered and patient verbalized understanding of the plan.  Roderica Cathell, MD [facility] Endocrinology [facility] [address] [place], [address] [place], [place] [address] Phone # [phone]  At least part of this note was generated using voice recognition software. Inadvertent word errors may have occurred, which were not recognized during the proofreading process.

## 2024-03-29 ENCOUNTER — Encounter (HOSPITAL_BASED_OUTPATIENT_CLINIC_OR_DEPARTMENT_OTHER): Admitting: Orthopaedic Surgery

## 2024-03-29 DIAGNOSIS — M7582 Other shoulder lesions, left shoulder: Secondary | ICD-10-CM

## 2024-03-29 NOTE — Progress Notes (Signed)
 "   Chief Complaint: Left shoulder pain     History of Present Illness:   03/29/2024: Presents today for follow-up of his left shoulder.  He is still having pain with overhead motion although the shot has helped him significantly  Joshua Vargas is a 78 y.o. male presents today with ongoing left shoulder pain after an injury and a direct fall on the side on May 18.  He was placed on a steroid pack with by the emergency provider which is given him significant pain relief.  His overhead range of motion has improved dramatically as the steroid has been kicking in.  Overall his overhead range of motion is much improving and his pain is continuing to improve as well.    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   History of colonic polyps    Hyperlipemia    Hypertension    Knee pain    Memory loss    Paresthesia    Pneumonia 1990s X 1; ~ 2000 X 1   Prolactinoma (HCC)    Type II diabetes mellitus (HCC)    Vitreous floaters of right eye    Past Surgical History:  Procedure Laterality Date   CARDIAC CATHETERIZATION  ?2006; 01/25/2018   ELBOW SURGERY Left    something to do w/the muscle   JOINT REPLACEMENT     LEFT HEART CATH AND CORONARY ANGIOGRAPHY N/A 01/26/2018   Procedure: LEFT HEART CATH AND CORONARY ANGIOGRAPHY;  Meuser: Jordan, Peter M, MD;  Location: [facility];  Service: Cardiovascular;  Laterality: N/A;   PICC LINE INSERTION  ~ 2005   took it out months later   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   TOTAL KNEE ARTHROPLASTY Left 2005   TRIGGER FINGER RELEASE Bilateral ~ 2016   Social History   Socioeconomic History   Marital  status: Married    Spouse name: Not on file   Number of children: Not on file   Years of education: Not on file   Highest education level: Not on file  Occupational History   Not on file  Tobacco Use   Smoking status: Never   Smokeless tobacco: Never  Vaping Use   Vaping status: Never Used  Substance and Sexual Activity   Alcohol use: Never   Drug use: Never   Sexual activity: Yes  Other Topics Concern   Not on file  Social History Narrative   Not on file   Social Drivers of Health   Tobacco Use: Low Risk (03/28/2024)   Patient History    Smoking Tobacco Use: Never    Smokeless Tobacco Use: Never    Passive Exposure: Not on file  Financial Resource Strain: Low Risk (02/15/2022)   Received from [facility]   Overall Financial Resource Strain (CARDIA)    Difficulty of Paying Living Expenses: Not very hard  Food Insecurity: No Food Insecurity (02/15/2022)   Received from [facility]   Epic    Within the past 12 months, you worried that your food would run out before you got the money to buy more.: Never true    Within the past 12  months, the food you bought just didn't last and you didn't have money to get more.: Never true  Transportation Needs: Not on file  Physical Activity: Sufficiently Active (02/15/2022)   Received from [facility]   Exercise Vital Sign    On average, how many days per week do you engage in moderate to strenuous exercise (like a brisk walk)?: 4 days    On average, how many minutes do you engage in exercise at this level?: 60 min  Stress: No Stress Concern Present (02/15/2022)   Received from [facility] of Occupational Health - Occupational Stress Questionnaire    Feeling of Stress : Not at all  Social Connections: Socially Integrated (02/15/2022)   Received from [facility]   Social Network    How would you rate your social network (family, work, friends)?: Good participation with social networks  Depression (PHQ2-9):  Not on file  Alcohol Screen: Not on file  Housing: Not on file  Utilities: Not on file  Health Literacy: Not on file   Family History  Problem Relation Age of Onset   Cancer Father    Diabetes Brother    Heart attack Brother 82   Allergic rhinitis Neg Hx    Angioedema Neg Hx    Asthma Neg Hx    Atopy Neg Hx    Eczema Neg Hx    Immunodeficiency Neg Hx    Urticaria Neg Hx    Allergies  Allergen Reactions   Atorvastatin Itching    Other reaction(s): Other, Unknown Other reaction(s): increased LFT's   Dextran Rash    Other Reaction: Not Assessed Other reaction(s): Other Other Reaction: Not Assessed Other Reaction: Not Assessed Other Reaction: Not Assessed   Current Outpatient Medications  Medication Sig Dispense Refill   acetaminophen  (TYLENOL ) 500 MG tablet Take 500 mg by mouth as needed. pain     albuterol  (VENTOLIN  HFA) 108 (90 Base) MCG/ACT inhaler Inhale 1-2 puffs into the lungs every 6 (six) hours as needed for wheezing or shortness of breath. 1 each 0   amLODipine  (NORVASC ) 10 MG tablet TAKE 1 TABLET EVERY DAY (NEED MD APPOINTMENT) 90 tablet 3   aspirin  81 MG EC tablet Take 81 mg by mouth daily.     augmented betamethasone dipropionate (DIPROLENE-AF) 0.05 % cream APPLY SPARINGLY TO AFFECTED AREA TWICE A DAY     Blood Glucose Monitoring Suppl (ACCU-CHEK GUIDE) w/Device KIT 1 Device by Does not apply route daily. 1 kit 0   Blood Glucose Monitoring Suppl DEVI 1 each by Does not apply route in the morning, at noon, and at bedtime. May substitute to any manufacturer covered by patient's insurance. 1 each 0   carvedilol  (COREG ) 12.5 MG tablet Take 1 tablet (12.5 mg total) by mouth 2 (two) times daily. 180 tablet 2   chlorthalidone  (HYGROTON ) 25 MG tablet TAKE 1 TABLET EVERY DAY 90 tablet 3   Cholecalciferol (VITAMIN D3) 10 MCG (400 UNIT) CAPS SMARTSIG:1 Capsule(s) By Mouth     ciclopirox (PENLAC) 8 % solution SMARTSIG:1 sparingly Topical Every Night     Dulaglutide   (TRULICITY ) 1.5 MG/0.5ML SOAJ Inject 1.5 mg into the skin once a week. 6 mL 4   Evolocumab  (REPATHA  SURECLICK) 140 MG/ML SOAJ Inject 140 mg into the skin every 14 (fourteen) days. 6 mL 1   GEMTESA 75 MG TABS Take 1 tablet by mouth daily.     glimepiride  (AMARYL ) 2 MG tablet Take 1 tablet (2 mg total) by mouth daily. 90  tablet 3   glucose blood (ACCU-CHEK GUIDE) test strip Use as instructed 100 each 12   Glucose Blood (BLOOD GLUCOSE TEST STRIPS) STRP 1 each by In Vitro route daily. May substitute to any manufacturer covered by patient's insurance. 100 each 3   irbesartan  (AVAPRO ) 300 MG tablet TAKE 1 TABLET EVERY DAY 30 tablet 0   Lancets Misc. MISC 1 each by Does not apply route daily. May substitute to any manufacturer covered by patient's insurance. 100 each 3   latanoprost  (XALATAN ) 0.005 % ophthalmic solution SMARTSIG:In Eye(s)     metFORMIN  (GLUCOPHAGE ) 1000 MG tablet Take 1 tablet (1,000 mg total) by mouth 2 (two) times daily with a meal. 180 tablet 3   methylPREDNISolone  (MEDROL  DOSEPAK) 4 MG TBPK tablet See pack instructions 21 each 0   oxyCODONE  (ROXICODONE ) 5 MG immediate release tablet Take 0.5 tablets (2.5 mg total) by mouth every 6 (six) hours as needed for severe pain (pain score 7-10) or breakthrough pain. 12 tablet 0   pioglitazone  (ACTOS ) 30 MG tablet Take 1 tablet (30 mg total) by mouth daily. 90 tablet 3   potassium chloride  (KLOR-CON  M) 10 MEQ tablet TAKE 1 TABLET EVERY DAY 90 tablet 0   pravastatin  (PRAVACHOL ) 40 MG tablet Take 1 tablet (40 mg total) by mouth daily. 90 tablet 3   tadalafil  (CIALIS ) 5 MG tablet TAKE 1 TABLET EVERY DAY AS NEEDED FOR ERECTILE DYSFUNCTION 30 tablet 0   tamsulosin (FLOMAX) 0.4 MG CAPS capsule Take 0.4 mg by mouth daily.     terbinafine (LAMISIL) 250 MG tablet SMARTSIG:1 Tablet(s) By Mouth     No current facility-administered medications for this visit.   No results found.  Review of Systems:   A ROS was performed including pertinent positives  and negatives as documented in the HPI.  Physical Exam :   Constitutional: NAD and appears stated age Neurological: Alert and oriented Psych: Appropriate affect and cooperative There were no vitals taken for this visit.   Comprehensive Musculoskeletal Exam:    Left shoulder with tenderness laterally about the deltoid which radiates.  No biceps tenderness with negative speeds test.  Active forward elevation is to 160 degrees with negative Neer impingement.   Imaging:   Xray (3 views left shoulder 4 views left knee): Calcific tendinitis with otherwise well-preserved joint, left knee without evidence of complication status post megaprosthesis total knee arthroplasty    I personally reviewed and interpreted the radiographs.   Assessment and Plan:   78 y.o. male with left shoulder pain consistent with calcific tendinitis which has been persistently painful.  Overall he is improved with the injection although at today's visit is he is still having overhead range of motion difficulty and weakness.  Given this we will plan for an MRI of the left shoulder and follow-up discuss results   - Plan for MRI left shoulder and follow-up to discuss results  I personally saw and evaluated the patient, and participated in the management and treatment plan.  Elspeth Parker, MD Attending Physician, Orthopedic Surgery  This document was dictated using Dragon voice recognition software. A reasonable attempt at proof reading has been made to minimize errors. "

## 2024-04-01 ENCOUNTER — Telehealth: Payer: Self-pay

## 2024-04-01 DIAGNOSIS — I38 Endocarditis, valve unspecified: Secondary | ICD-10-CM

## 2024-04-01 NOTE — Telephone Encounter (Signed)
**Note De-Identified Joshua Vargas Obfuscation** I did a Split Night Sleep Study PA through the [employer] provider portal and it has been approved From Date 05/13/2024 To Date 08/10/2024. Cert: [id number]   I called the pt and made him and his wife aware that [employer] approved coverage of his Split Night Sleep Study PA and I gave them the sleep lab's phone number so they can call to get the pt scheduled for his sleep study.  They both verbalized understanding and thanked me for calling them.

## 2024-04-01 NOTE — Telephone Encounter (Signed)
 Echocardiogram results read and MRI order placed for the patient.

## 2024-04-01 NOTE — Telephone Encounter (Signed)
 Called to update the patient and told him to go to the lab to go get labs done in order to get his MRI completed.

## 2024-04-08 ENCOUNTER — Other Ambulatory Visit: Payer: Self-pay

## 2024-04-08 DIAGNOSIS — I38 Endocarditis, valve unspecified: Secondary | ICD-10-CM

## 2024-04-09 ENCOUNTER — Ambulatory Visit: Payer: Self-pay | Admitting: Emergency Medicine

## 2024-04-09 LAB — HEMOGLOBIN AND HEMATOCRIT, BLOOD
Hematocrit: 37.9 % (ref 37.5–51.0)
Hemoglobin: 12.8 g/dL — ABNORMAL LOW (ref 13.0–17.7)

## 2024-04-15 ENCOUNTER — Encounter (HOSPITAL_BASED_OUTPATIENT_CLINIC_OR_DEPARTMENT_OTHER): Payer: Self-pay | Admitting: Orthopaedic Surgery

## 2024-04-16 ENCOUNTER — Other Ambulatory Visit

## 2024-04-16 DIAGNOSIS — M7582 Other shoulder lesions, left shoulder: Secondary | ICD-10-CM

## 2024-05-01 ENCOUNTER — Ambulatory Visit: Admitting: Podiatry

## 2024-05-01 ENCOUNTER — Encounter: Payer: Self-pay | Admitting: Podiatry

## 2024-05-01 DIAGNOSIS — M79674 Pain in right toe(s): Secondary | ICD-10-CM | POA: Diagnosis not present

## 2024-05-01 DIAGNOSIS — M79675 Pain in left toe(s): Secondary | ICD-10-CM | POA: Diagnosis not present

## 2024-05-01 DIAGNOSIS — B351 Tinea unguium: Secondary | ICD-10-CM

## 2024-05-01 DIAGNOSIS — E1140 Type 2 diabetes mellitus with diabetic neuropathy, unspecified: Secondary | ICD-10-CM | POA: Diagnosis not present

## 2024-05-03 ENCOUNTER — Other Ambulatory Visit: Payer: Self-pay | Admitting: Urology

## 2024-05-03 ENCOUNTER — Ambulatory Visit (HOSPITAL_BASED_OUTPATIENT_CLINIC_OR_DEPARTMENT_OTHER): Admitting: Orthopaedic Surgery

## 2024-05-03 DIAGNOSIS — R972 Elevated prostate specific antigen [PSA]: Secondary | ICD-10-CM

## 2024-05-03 DIAGNOSIS — M7582 Other shoulder lesions, left shoulder: Secondary | ICD-10-CM | POA: Diagnosis not present

## 2024-05-03 NOTE — Progress Notes (Signed)
 "   Chief Complaint: Left shoulder pain     History of Present Illness:   05/03/2024: Follows up today for MRI of the left shoulder.  He is feeling much improved after his subacromial injection  Joshua Vargas is a 79 y.o. male presents today with ongoing left shoulder pain after an injury and a direct fall on the side on May 18.  He was placed on a steroid pack with by the emergency provider which is given him significant pain relief.  His overhead range of motion has improved dramatically as the steroid has been kicking in.  Overall his overhead range of motion is much improving and his pain is continuing to improve as well.    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   History of colonic polyps    Hyperlipemia    Hypertension    Knee pain    Memory loss    Paresthesia    Pneumonia 1990s X 1; ~ 2000 X 1   Prolactinoma (HCC)    Type II diabetes mellitus (HCC)    Vitreous floaters of right eye    Past Surgical History:  Procedure Laterality Date   CARDIAC CATHETERIZATION  ?2006; 01/25/2018   ELBOW SURGERY Left    something to do w/the muscle   JOINT REPLACEMENT     LEFT HEART CATH AND CORONARY ANGIOGRAPHY N/A 01/26/2018   Procedure: LEFT HEART CATH AND CORONARY ANGIOGRAPHY;  Rocha: Jordan, Peter M, MD;  Location: [facility];  Service: Cardiovascular;  Laterality: N/A;   PICC LINE INSERTION  ~ 2005   took it out months later   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   TOTAL KNEE ARTHROPLASTY Left 2005   TRIGGER FINGER RELEASE Bilateral ~ 2016   Social History   Socioeconomic History   Marital status: Married    Spouse name: Not  on file   Number of children: Not on file   Years of education: Not on file   Highest education level: Not on file  Occupational History   Not on file  Tobacco Use   Smoking status: Never   Smokeless tobacco: Never  Vaping Use   Vaping status: Never Used  Substance and Sexual Activity   Alcohol use: Never   Drug use: Never   Sexual activity: Yes  Other Topics Concern   Not on file  Social History Narrative   Not on file   Social Drivers of Health   Tobacco Use: Low Risk (05/01/2024)   Patient History    Smoking Tobacco Use: Never    Smokeless Tobacco Use: Never    Passive Exposure: Not on file  Financial Resource Strain: Low Risk (02/15/2022)   Received from [facility]   Overall Financial Resource Strain (CARDIA)    Difficulty of Paying Living Expenses: Not very hard  Food Insecurity: No Food Insecurity (02/15/2022)   Received from [facility]   Epic    Within the past 12 months, you worried that your food would run out before you got the money to buy more.: Never true    Within the past 12 months, the food you bought  just didn't last and you didn't have money to get more.: Never true  Transportation Needs: Not on file  Physical Activity: Sufficiently Active (02/15/2022)   Received from [facility]   Exercise Vital Sign    On average, how many days per week do you engage in moderate to strenuous exercise (like a brisk walk)?: 4 days    On average, how many minutes do you engage in exercise at this level?: 60 min  Stress: No Stress Concern Present (02/15/2022)   Received from [facility] of Occupational Health - Occupational Stress Questionnaire    Feeling of Stress : Not at all  Social Connections: Socially Integrated (02/15/2022)   Received from [facility]   Social Network    How would you rate your social network (family, work, friends)?: Good participation with social networks  Depression (PHQ2-9): Not on file  Alcohol Screen: Not on  file  Housing: Not on file  Utilities: Not on file  Health Literacy: Not on file   Family History  Problem Relation Age of Onset   Cancer Father    Diabetes Brother    Heart attack Brother 60   Allergic rhinitis Neg Hx    Angioedema Neg Hx    Asthma Neg Hx    Atopy Neg Hx    Eczema Neg Hx    Immunodeficiency Neg Hx    Urticaria Neg Hx    Allergies  Allergen Reactions   Atorvastatin Itching    Other reaction(s): Other, Unknown Other reaction(s): increased LFT's   Dextran Rash    Other Reaction: Not Assessed Other reaction(s): Other Other Reaction: Not Assessed Other Reaction: Not Assessed Other Reaction: Not Assessed   Current Outpatient Medications  Medication Sig Dispense Refill   acetaminophen  (TYLENOL ) 500 MG tablet Take 500 mg by mouth as needed. pain     albuterol  (VENTOLIN  HFA) 108 (90 Base) MCG/ACT inhaler Inhale 1-2 puffs into the lungs every 6 (six) hours as needed for wheezing or shortness of breath. 1 each 0   amLODipine  (NORVASC ) 10 MG tablet TAKE 1 TABLET EVERY DAY (NEED MD APPOINTMENT) 90 tablet 3   aspirin  81 MG EC tablet Take 81 mg by mouth daily.     augmented betamethasone dipropionate (DIPROLENE-AF) 0.05 % cream APPLY SPARINGLY TO AFFECTED AREA TWICE A DAY     Blood Glucose Monitoring Suppl (ACCU-CHEK GUIDE) w/Device KIT 1 Device by Does not apply route daily. 1 kit 0   Blood Glucose Monitoring Suppl DEVI 1 each by Does not apply route in the morning, at noon, and at bedtime. May substitute to any manufacturer covered by patient's insurance. 1 each 0   carvedilol  (COREG ) 12.5 MG tablet Take 1 tablet (12.5 mg total) by mouth 2 (two) times daily. 180 tablet 2   chlorthalidone  (HYGROTON ) 25 MG tablet TAKE 1 TABLET EVERY DAY 90 tablet 3   Cholecalciferol (VITAMIN D3) 10 MCG (400 UNIT) CAPS SMARTSIG:1 Capsule(s) By Mouth     ciclopirox (PENLAC) 8 % solution SMARTSIG:1 sparingly Topical Every Night     Dulaglutide  (TRULICITY ) 1.5 MG/0.5ML SOAJ Inject 1.5 mg  into the skin once a week. 6 mL 4   Evolocumab  (REPATHA  SURECLICK) 140 MG/ML SOAJ Inject 140 mg into the skin every 14 (fourteen) days. 6 mL 1   GEMTESA 75 MG TABS Take 1 tablet by mouth daily.     glimepiride  (AMARYL ) 2 MG tablet Take 1 tablet (2 mg total) by mouth daily. 90 tablet 3   glucose  blood (ACCU-CHEK GUIDE) test strip Use as instructed 100 each 12   Glucose Blood (BLOOD GLUCOSE TEST STRIPS) STRP 1 each by In Vitro route daily. May substitute to any manufacturer covered by patient's insurance. 100 each 3   irbesartan  (AVAPRO ) 300 MG tablet TAKE 1 TABLET EVERY DAY 30 tablet 0   Lancets Misc. MISC 1 each by Does not apply route daily. May substitute to any manufacturer covered by patient's insurance. 100 each 3   latanoprost  (XALATAN ) 0.005 % ophthalmic solution SMARTSIG:In Eye(s)     metFORMIN  (GLUCOPHAGE ) 1000 MG tablet Take 1 tablet (1,000 mg total) by mouth 2 (two) times daily with a meal. 180 tablet 3   methylPREDNISolone  (MEDROL  DOSEPAK) 4 MG TBPK tablet See pack instructions 21 each 0   oxyCODONE  (ROXICODONE ) 5 MG immediate release tablet Take 0.5 tablets (2.5 mg total) by mouth every 6 (six) hours as needed for severe pain (pain score 7-10) or breakthrough pain. 12 tablet 0   pioglitazone  (ACTOS ) 30 MG tablet Take 1 tablet (30 mg total) by mouth daily. 90 tablet 3   potassium chloride  (KLOR-CON  M) 10 MEQ tablet TAKE 1 TABLET EVERY DAY 90 tablet 0   pravastatin  (PRAVACHOL ) 40 MG tablet Take 1 tablet (40 mg total) by mouth daily. 90 tablet 3   tadalafil  (CIALIS ) 5 MG tablet TAKE 1 TABLET EVERY DAY AS NEEDED FOR ERECTILE DYSFUNCTION 30 tablet 0   tamsulosin (FLOMAX) 0.4 MG CAPS capsule Take 0.4 mg by mouth daily.     terbinafine (LAMISIL) 250 MG tablet SMARTSIG:1 Tablet(s) By Mouth     No current facility-administered medications for this visit.   No results found.  Review of Systems:   A ROS was performed including pertinent positives and negatives as documented in the  HPI.  Physical Exam :   Constitutional: NAD and appears stated age Neurological: Alert and oriented Psych: Appropriate affect and cooperative There were no vitals taken for this visit.   Comprehensive Musculoskeletal Exam:    Left shoulder with tenderness laterally about the deltoid which radiates.  No biceps tenderness with negative speeds test.  Active forward elevation is to 160 degrees with negative Neer impingement.   Imaging:   Xray (3 views left shoulder 4 views left knee): Calcific tendinitis with otherwise well-preserved joint, left knee without evidence of complication status post megaprosthesis total knee arthroplasty    I personally reviewed and interpreted the radiographs.   Assessment and Plan:   79 y.o. male with left shoulder pain with MRI consistent with full-thickness musculotendinous junction tear.  At this time he is doing extremely well with his injection.  He would like to plan to avoid surgery at this time which I do agree with given the quality of his shoulder following the injection.  He does have some mild weakness which is consistent with MRI findings although these are very tolerable.  I will plan to see him back in summer for further assessment of the left knee as well as left shoulder   - Return to clinic summer  I personally saw and evaluated the patient, and participated in the management and treatment plan.  Elspeth Parker, MD Attending Physician, Orthopedic Surgery  This document was dictated using Dragon voice recognition software. A reasonable attempt at proof reading has been made to minimize errors. "

## 2024-05-06 ENCOUNTER — Other Ambulatory Visit: Payer: Self-pay | Admitting: Cardiology

## 2024-05-06 NOTE — Progress Notes (Signed)
"  °  Subjective:  Patient ID: Joshua CROME Snipe, male    DOB: 09-24-1945,  MRN: [id number]  Joshua Vargas presents to clinic today for preventative diabetic foot care for painful thick toenails that are difficult to trim. Pain interferes with ambulation. Aggravating factors include wearing enclosed shoe gear. Pain is relieved with periodic professional debridement. He has questions regarding his thick toenail right great toenail. Chief Complaint  Patient presents with   [facility]    Patient presents today for RFC and nail trim    New problem(s): None.   PCP is Seabron Lenis, MD. ARNETTA 04/25/24.  Allergies[1]  Review of Systems: Negative except as noted in the HPI.  Objective: No changes noted in today's physical examination. There were no vitals filed for this visit. Joshua CROME Baranski is a pleasant 79 y.o. male WD, WN in NAD. AAO x 3.  Vascular Examination: Capillary refill time immediate b/l. Palpable pedal pulses. Pedal hair present b/l. Pedal edema absent. No pain with calf compression b/l. Skin temperature gradient WNL b/l. No cyanosis or clubbing b/l. No ischemia or gangrene noted b/l.   Neurological Examination: Pt has subjective symptoms of neuropathy. Protective sensation intact 5/5 intact bilaterally with 10g monofilament b/l.  Dermatological Examination:  Pedal skin with normal turgor, texture and tone b/l. SABRA No interdigital macerations.   Toenails 1-5 b/l thick, discolored, elongated with subungual debris and pain on dorsal palpation.   No corns, calluses, nor porokeratotic lesions.  Musculoskeletal Examination: Muscle strength 5/5 to all lower extremity muscle groups bilaterally. No gross bony deformities bilaterally.  Radiographs: None  Assessment/Plan: 1. Pain due to onychomycosis of toenails of both feet   2. Type 2 diabetes mellitus with diabetic neuropathy, unspecified whether long term insulin  use (HCC)   Patient was evaluated and treated. All patient's and/or POA's  questions/concerns addressed on today's visit. Toenails 1-5 b/l debrided in length and girth without incident. Discussed treatment options for mycotic toenails including permanent removal of right great toenail. Patient would like to continue with conservative nail debridements. Continue foot and shoe inspections daily. Monitor blood glucose per PCP/Endocrinologist's recommendations. Continue soft, supportive shoe gear daily. Report any pedal injuries to medical professional. Call office if there are any questions/concerns. -Patient/POA to call should there be question/concern in the interim.   Return in about 3 months (around 07/30/2024).  [name], DPM      [place] LOCATION: [address], [place] [address]                   Office [phone]   [facility] LOCATION: [address] [place], [place] [address] Office [phone]     [1]  Allergies Allergen Reactions   Atorvastatin Itching    Other reaction(s): Other, Unknown Other reaction(s): increased LFT's   Dextran Rash    Other Reaction: Not Assessed Other reaction(s): Other Other Reaction: Not Assessed Other Reaction: Not Assessed Other Reaction: Not Assessed   "

## 2024-05-08 ENCOUNTER — Encounter: Payer: Self-pay | Admitting: Urology

## 2024-06-04 ENCOUNTER — Ambulatory Visit: Admitting: Cardiology

## 2024-06-12 ENCOUNTER — Ambulatory Visit: Admitting: Podiatry

## 2024-06-22 ENCOUNTER — Other Ambulatory Visit

## 2024-06-23 ENCOUNTER — Other Ambulatory Visit

## 2024-07-03 ENCOUNTER — Ambulatory Visit: Admitting: Podiatry

## 2024-07-16 ENCOUNTER — Ambulatory Visit (HOSPITAL_BASED_OUTPATIENT_CLINIC_OR_DEPARTMENT_OTHER): Admitting: Cardiology

## 2024-07-17 ENCOUNTER — Ambulatory Visit: Admitting: Cardiology

## 2024-07-22 ENCOUNTER — Other Ambulatory Visit

## 2024-07-29 ENCOUNTER — Ambulatory Visit: Admitting: Endocrinology

## 2024-08-06 ENCOUNTER — Ambulatory Visit: Admitting: Podiatry

## 2024-08-30 ENCOUNTER — Ambulatory Visit (HOSPITAL_BASED_OUTPATIENT_CLINIC_OR_DEPARTMENT_OTHER): Admitting: Orthopaedic Surgery
